# Patient Record
Sex: Male | Born: 1955 | Race: White | Hispanic: No | Marital: Married | Smoking: Former smoker
Health system: Southern US, Community
[De-identification: ages and names within clinical notes are randomized; demographics above are authoritative.]

## PROBLEM LIST (undated history)

## (undated) DIAGNOSIS — F329 Major depressive disorder, single episode, unspecified: Secondary | ICD-10-CM

## (undated) DIAGNOSIS — F419 Anxiety disorder, unspecified: Secondary | ICD-10-CM

## (undated) DIAGNOSIS — M1990 Unspecified osteoarthritis, unspecified site: Secondary | ICD-10-CM

## (undated) DIAGNOSIS — F32A Depression, unspecified: Secondary | ICD-10-CM

## (undated) DIAGNOSIS — G4730 Sleep apnea, unspecified: Secondary | ICD-10-CM

## (undated) DIAGNOSIS — Z87442 Personal history of urinary calculi: Secondary | ICD-10-CM

## (undated) HISTORY — PX: BACK SURGERY: SHX140

## (undated) HISTORY — PX: DENTAL SURGERY: SHX609

## (undated) HISTORY — PX: APPENDECTOMY: SHX54

## (undated) HISTORY — PX: AMPUTATION TOE: SHX6595

---

## 2006-11-21 ENCOUNTER — Ambulatory Visit: Payer: Self-pay | Admitting: Internal Medicine

## 2006-11-21 ENCOUNTER — Inpatient Hospital Stay (HOSPITAL_COMMUNITY): Admission: EM | Admit: 2006-11-21 | Discharge: 2006-11-22 | Payer: Self-pay | Admitting: Emergency Medicine

## 2010-09-16 NOTE — Discharge Summary (Signed)
NAMEAVELARDO, REESMAN NO.:  [id number]   MEDICAL RECORD NO.:  [id number]          PATIENT TYPE:  INP   LOCATION:  3729                         FACILITY:  [facility]   PHYSICIAN:  Lonia Blood, M.D.       DATE OF BIRTH:  07/10/1955   DATE OF ADMISSION:  11/21/2006  DATE OF DISCHARGE:  11/22/2006                               DISCHARGE SUMMARY   PRIMARY CARE PHYSICIAN:  Dr. Jules Husbands at [facility].   DISCHARGE DIAGNOSES:  1. Chest pain -- felt to be noncardiac -- workup in progress through      the [facility].  2. Tobacco abuse.  3. Alcohol abuse.  4. Hyperlipidemia.  5. Status post left knee arthroscopic surgery.  6. History of lumbar disk herniation.  7. Probable esophageal reflux.   DISCHARGE MEDICATIONS:  1. Aspirin 81 mg daily.  2. Nicotine patch 14 mg daily.  3. Pepcid 40 mg daily.  4. Zocor 10 mg daily.  5. Nitroglycerin 0.4 mg sublingually as needed for chest pain.  6. Albuterol MDI one puff 3 times a day as needed.   CONDITION ON DISCHARGE:  Mr. Pitner was discharged in good condition.  At  the time of discharge, the patient was encouraged to follow up at the  [facility] for his schedule outpatient  stress test.   PROCEDURES DURING THIS ADMISSION:  No procedures were done.   CONSULTATION DURING THIS ADMISSION:  The patient was seen in  consultation by Dr. Dietrich Pates from Cardiology.   HISTORY AND PHYSICAL:  For admission history and physical, refer to the  dictated H&P done by Dr. Della Goo, but briefly, Mr. Hilliker was  admitted on the night of November 21, 2006 for recurrent episodes of chest  pain.   HOSPITAL COURSE:  PROBLEM #1 - CHEST PAIN:  Mr. Schuld was observed for  24 hours and a telemetry unit.  He did not develop any arrhythmias.  The  patient had 3 sets of cardiac enzymes which were all within normal  limits.  The patient's EKG did not show any S-T  segment or T-wave  changes.  The patient was seen in consultation by Dr. Dietrich Pates from  Cardiology, who recommended outpatient stress test, which was already  scheduled with the [facility].  The patient was counseled extensively about his tobacco abuse and was  advised to quit smoking.  He was also provided with a prescription for  nicotine patches.  The patient was instructed to continue his aspirin  and Zocor as previously prescribed.      Lonia Blood, M.D.  Electronically Signed     SL/MEDQ  D:  11/22/2006  T:  11/23/2006  Job:  [id number]   cc:   [facility] [employer] .

## 2010-09-16 NOTE — H&P (Signed)
Russell Steele, Russell Steele                ACCOUNT NO.:  [id number]   MEDICAL RECORD NO.:  [id number]          PATIENT TYPE:  INP   LOCATION:  3729                         FACILITY:  [facility]   PHYSICIAN:  Della Goo, M.D. DATE OF BIRTH:  [birth year]   DATE OF ADMISSION:  11/21/2006  DATE OF DISCHARGE:  11/22/2006                              HISTORY & PHYSICAL   This is an unassigned patient.   CHIEF COMPLAINT:  Chest pain.   HISTORY OF PRESENT ILLNESS:  This is a 55 year old male presenting to  the emergency department secondary to complaints of chest pain which he  reports having off and on for several months.  He reports this evening  the pain began and was unremitting.  He described the pain as being  stabbing at first but progressing to a heaviness in the chest and a  funny feeling in the left arm.  The patient does report the pain is in  left side of his chest.  The patient reports prior to arriving at the  hospital he had taken 2 regular strength aspirin for the chest pain.  On  arrival in the emergency department, he was painfree.   PAST MEDICAL HISTORY:  Hyperlipidemia.   MEDICATIONS:  Simvastatin 10 mg 1 p.o. every night.   PAST SURGICAL HISTORY:  Left knee arthroscopy in January 2008,  appendectomy and also a right clubfoot correction surgery.   SOCIAL HISTORY:  The patient smokes 1 pack per day for 20 years.  Positive alcohol usage, occasionally 6 to 8 beers on occasions monthly.   FAMILY HISTORY:  The patient reports being adopted and does not know his  family medical history.   REVIEW OF SYSTEMS:  The patient denies having any nausea, vomiting.  Does report having diaphoresis.  Denies having any fevers, chills.  Denies having shortness of breath.   EXAMINATION FINDINGS:  This is a 55 year old, well-nourished, well-  developed male in no discomfort or acute distress at this time.  VITAL SIGNS:  Temperature 97.7, blood pressure 128/76, heart rate 60,  respirations  18, O2 saturation 96%.  HEENT:  Normocephalic, atraumatic.  Pupils equally round, reactive to  light.  Extraocular muscles are intact.  Funduscopic benign.  Oropharynx  is clear.  NECK:  Is supple.  Full range of motion.  No thyromegaly, adenopathy,  jugular venous distension.  CARDIOVASCULAR:  Regular rate and rhythm.  No murmurs, gallops or rubs.  LUNGS:  Clear to auscultation bilaterally.  ABDOMEN:  Positive bowel sounds, soft, nontender, nondistended.  EXTREMITIES: Without cyanosis, clubbing or edema.  NEUROLOGIC EXAMINATION:  Nonfocal.  VASCULAR:  2+ dorsal pedal pulses bilaterally.   LABORATORY STUDIES:  White blood cell count 7.2, hemoglobin 13.9,  hematocrit 40.8, MCV 96.5, platelets 248, neutrophils 70%, lymphocytes  21%.  Sodium 140, potassium 4.2, chloride 109, carbon dioxide 24, BUN  13, creatinine 1.11, glucose 86, lipase 17.  Myoglobin 50.8, CK-MB less  than 1.0, troponin less than 0.05.  EKG reveals a normal sinus rhythm  without acute ST-segment changes.   ASSESSMENT:  46. A 55 year old male being admitted with chest pain, rule  out MI.  2. Hyperlipidemia.  3. Tobacco abuse.   PLAN:  The patient will be admitted to a telemetry area for cardiac  monitoring.  Cardiac enzymes will be cycled.  He will be placed on nitro  paste along with aspirin therapy, oxygen therapy and a low-dose beta-  blocker therapy.  The patient will be placed on DVT and GI prophylaxis  at this time.  A fasting lipid panel will be checked in the a.m. along  with homocysteine levels and TSH.  Smoking cessation has been discussed,  and smoking cessation counseling will be ordered.      Della Goo, M.D.  Electronically Signed     HJ/MEDQ  D:  11/22/2006  T:  11/23/2006  Job:  [id number]

## 2010-09-16 NOTE — Consult Note (Signed)
NAME:  NAYAN, PROCH NO.:  [id number]   MEDICAL RECORD NO.:  [id number]          PATIENT TYPE:  INP   LOCATION:  3729                         FACILITY:  [facility]   PHYSICIAN:  Pricilla Riffle, MD, FACCDATE OF BIRTH:  1956/02/26   DATE OF CONSULTATION:  11/22/2006  DATE OF DISCHARGE:                                 CONSULTATION   REQUESTING PHYSICIAN:  Dr. Lonia Blood with the [facility] team   PRIMARY CARE Rise Traeger:  Dr. Jules Husbands at the [facility]   PRIMARY CARDIOLOGIST:  Patient is new to [facility] being seen  by Dr. Dietrich Pates   PATIENT PROFILE:  A 55 year old Caucasian male without prior history of  coronary artery disease who presented to the emergency department  yesterday with chest pain.   PROBLEM LIST:  1. Chest pain.  2. Ongoing tobacco abuse, less than one pack per day, 29-pack-year      history.  3. ETOH abuse.  4. Hyperlipidemia, recently started on simvastatin therapy.  5. Status post left knee arthroscopic surgery this year.  6. 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  7. History of L4-L5 disc herniation status post surgery in the late      1980's.   HISTORY OF PRESENT ILLNESS:  This is a 55 year old Caucasian male  without prior history of CAD.  Risk factors include hyperlipidemia and  ongoing tobacco abuse.  He has an approximate six-month history of  intermittent, 9/10, sharp/stabbing/grabbing, left chest pain, occurring  almost exclusively at rest one to two times per month, associated with  mild nausea, lasting just a few seconds, and resolving spontaneously.  He was seen at the [facility] approximately three weeks ago and  was scheduled for a pharmacologic Myoview, as he cannot walk on a  treadmill given his recent left knee surgery.  The scheduling of his  Myoview is pending currently.  They told him that if he had any  recurrent chest pain that he should  present to his local ED.  Yesterday  while at home, he had recurrence of 9/10, sharp, brief chest discomfort  and called 911.  Upon their arrival he was pain free, but then had a  second episode while they were there and they; therefore, brought him  into the [facility] ED.  In the ED, he ruled out for MI and ECG showed  no acute changes.  His D-dimer was negative.  He was admitted for  evaluation.  He has had no recurrent chest pain.   ALLERGIES:  PENICILLIN.   CURRENT MEDICATIONS:  1. Aspirin 325 mg q.d.  2. Nicotine 14 mg q.d.  3. Protonix 40 IV q.d.  4. Senna one tab q.h.s.  5. Zocor 10 mg q.p.m.   HOME MEDICATIONS:  Aspirin and Zocor.   FAMILY HISTORY:  He is adopted and does not know about his biologic  parents' health history.   SOCIAL HISTORY:  He lives in [place] with his fiance.  He is an  Radio broadcast assistant.  He  is previously divorced.  He has a 29-pack-  year history of tobacco abuse, currently smoking less than one pack per  day.  He drinks between two and eight beers per day.  He denies any drug  use.  He does not routinely exercise, but is active at both work and in  his yard exerting himself fairly heavily without symptoms or  limitations.   REVIEW OF SYSTEMS:  Positive for fevers, chills and nausea and diarrhea  over a 48-hour period between July 2 and July 3.  He has had a headache  during this admission which is likely associated to nitroglycerin.  He  has chest pain as described in the HPI.  He has dyspnea on exertion if  he walks up and down stairs frequently.  He does have some anxiety with  regards to recently being out of work.  He has arthralgia of his hands  if he uses them a lot.  He has mildly associated nausea with his chest  pain as outlined above.  Otherwise, all systems reviewed negative.   PHYSICAL EXAMINATION:  VITAL SIGNS:  Temperature 97.1, heart rate 50,  respirations 18, blood pressure 111/58, pulse ox 95% on room air.  GENERAL:   Pleasant white male in no acute distress, awake, alert and  oriented x3.  HEENT:  Normal.  NEURO:  Grossly intact and nonfocal.  NECK:  No bruits or JVD.  LUNGS:  Respirations are regular and unlabored.  Clear to auscultation.  CARDIAC:  Regular S1-S2.  No S3-S4 or murmurs.  ABDOMEN:  Round, soft, nontender, nondistended.  Bowel sounds present  x4.  EXTREMITIES:  Warm, dry, pink.  No clubbing, cyanosis or edema.  Dorsalis pedis, posterior tibial pulses 2+ and equal bilaterally.   Chest x-ray is pending.   EKG shows sinus rhythm with a normal axis, rate of 67 beats per minute  and some early repolarization in V1/V2 and V3.   Hemoglobin 14.6, hematocrit 43.0, WBC 7.2, platelets 248, sodium 140,  potassium 4.2, chloride 108, CO2 24, BUN 13, creatinine 1.2, glucose 84.  Total bilirubin 0.6, alkaline phosphatase 62, AST 24, ALT 26, total  protein 6.4, albumin 3.8.  Cardiac markers are negative x3 with his most  recent CK being 92, MB of 1.6, troponin I 0.02.  D-dimer is 0.22.  Calcium is 8.4.  Total cholesterol 125, triglycerides 55, HDL 73, LDL  91.   ASSESSMENT/PLAN:  1. Chest pain, fairly atypical for angina, as he describes it as being      sharp, shooting and fleeting.  He is fairly active at work and at      home without symptoms or limitations.  ECG and cardiac markers are      within normal limits and he has had no additional chest pain.  He      already has a Myoview pending through the [facility] and      otherwise has no insurance.  I feel that he is okay for discharge      today with followup at the [facility]      for his Myoview.  Continue aspirin, statin, and will provide a      prescription for sublingual nitroglycerin.  2. Hyperlipidemia.  LDL of 91.  Continue statin.  3. Tobacco and alcohol abuse.  Cessation advised.      Nicolasa Ducking, ANP      Pricilla Riffle, MD, [facility]  Electronically Signed    CB/MEDQ  D:  11/22/2006  T:  11/22/2006  Job:  [id number]

## 2011-02-16 LAB — I-STAT 8, (EC8 V) (CONVERTED LAB)
BUN: 13
Bicarbonate: 23.6
Chloride: 108
Operator id: 277751
Potassium: 4.2
Sodium: 140
TCO2: 25
pH, Ven: 7.419 — ABNORMAL HIGH

## 2011-02-16 LAB — COMPREHENSIVE METABOLIC PANEL
ALT: 26
AST: 24
Alkaline Phosphatase: 62
Calcium: 8.4
GFR calc Af Amer: >60
Glucose, Bld: 86
Potassium: 4.2
Sodium: 140
Total Protein: 6.4

## 2011-02-16 LAB — POCT CARDIAC MARKERS
Myoglobin, poc: 53.8
Operator id: 277751
Operator id: 277751
Troponin i, poc: <0.05
Troponin i, poc: <0.05

## 2011-02-16 LAB — POCT I-STAT CREATININE: Creatinine, Ser: 1.2

## 2011-02-16 LAB — CK TOTAL AND CKMB (NOT AT ARMC)
Relative Index: 1.8
Total CK: 105

## 2011-02-16 LAB — DIFFERENTIAL
Basophils Relative: 1
Eosinophils Absolute: 0.1
Eosinophils Relative: 2
Lymphs Abs: 1.5
Monocytes Absolute: 0.4
Monocytes Relative: 6

## 2011-02-16 LAB — CARDIAC PANEL(CRET KIN+CKTOT+MB+TROPI)
Relative Index: INVALID
Relative Index: INVALID
Total CK: 92
Troponin I: 0.02

## 2011-02-16 LAB — D-DIMER, QUANTITATIVE: D-Dimer, Quant: 0.22

## 2011-02-16 LAB — CBC
Hemoglobin: 13.9
MCHC: 34
RBC: 4.23
RDW: 13.4

## 2011-02-16 LAB — LIPID PANEL
LDL Cholesterol: 91
VLDL: 11

## 2011-02-16 LAB — TROPONIN I: Troponin I: 0.02

## 2015-05-05 HISTORY — PX: KNEE ARTHROSCOPY: SUR90

## 2016-02-21 ENCOUNTER — Encounter (HOSPITAL_COMMUNITY): Payer: Self-pay | Admitting: Emergency Medicine

## 2016-02-21 ENCOUNTER — Emergency Department (HOSPITAL_COMMUNITY)
Admission: EM | Admit: 2016-02-21 | Discharge: 2016-02-21 | Disposition: A | Discharge status: Home / Self Care | Payer: Medicare Other | Attending: Emergency Medicine | Admitting: Emergency Medicine

## 2016-02-21 DIAGNOSIS — Y92007 Garden or yard of unspecified non-institutional (private) residence as the place of occurrence of the external cause: Secondary | ICD-10-CM | POA: Insufficient documentation

## 2016-02-21 DIAGNOSIS — W57XXXA Bitten or stung by nonvenomous insect and other nonvenomous arthropods, initial encounter: Secondary | ICD-10-CM | POA: Insufficient documentation

## 2016-02-21 DIAGNOSIS — Y9389 Activity, other specified: Secondary | ICD-10-CM | POA: Insufficient documentation

## 2016-02-21 DIAGNOSIS — F17210 Nicotine dependence, cigarettes, uncomplicated: Secondary | ICD-10-CM | POA: Insufficient documentation

## 2016-02-21 DIAGNOSIS — Y999 Unspecified external cause status: Secondary | ICD-10-CM | POA: Insufficient documentation

## 2016-02-21 DIAGNOSIS — S40862A Insect bite (nonvenomous) of left upper arm, initial encounter: Secondary | ICD-10-CM | POA: Insufficient documentation

## 2016-02-21 MED ORDER — PREDNISONE 20 MG PO TABS: 40.0000 mg | ORAL_TABLET | Freq: Every day | ORAL | 0 refills | Status: DC

## 2016-02-21 MED ORDER — NAPROXEN 500 MG PO TABS: 500.0000 mg | ORAL_TABLET | Freq: Two times a day (BID) | ORAL | 0 refills | Status: DC

## 2016-02-21 MED ORDER — HYDROCODONE-ACETAMINOPHEN 5-325 MG PO TABS: 1.0000 | ORAL_TABLET | Freq: Once | ORAL | Status: DC

## 2016-02-21 MED ORDER — CYCLOBENZAPRINE HCL 10 MG PO TABS
10.0000 mg | ORAL_TABLET | Freq: Once | ORAL | Status: AC
  Administered 2016-02-21: 10 mg via ORAL
  Filled 2016-02-21: qty 1

## 2016-02-21 MED ORDER — IBUPROFEN 800 MG PO TABS
800.0000 mg | ORAL_TABLET | Freq: Once | ORAL | Status: AC
  Administered 2016-02-21: 800 mg via ORAL
  Filled 2016-02-21: qty 1

## 2016-02-21 NOTE — Discharge Instructions (Signed)
Ice packs on/off to arm.  Continue taking benadryl one capsule every 4-6 hrs for itching.  Return here for any worsening symptoms such as increasing pain, redness or swelling

## 2016-02-21 NOTE — ED Provider Notes (Signed)
[facility]-EMERGENCY DEPT Provider Note   CSN: [id number] Arrival date & time: 02/21/16  1627     History   Chief Complaint Chief Complaint  Patient presents with  . Insect Bite    HPI Russell Steele is a 60 y.o. male.  HPI  Russell Steele is a 60 y.o. male who presents to the Emergency Department complaining of Insect bite to left upper arm. He states that he was outside working in the yard when he felt something bite or sting his upper arm and saw some type of unknown insect. Incident occurred approximately 4 hours prior to arrival. He applied ice and took Benadryl. He reports intermittent sharp stinging pains to the affected area with mild swelling and redness. He denies numbness of his arm, chest pain, shortness of breath, and facial or airway edema.    History reviewed. No pertinent past medical history.  There are no active problems to display for this patient.   Past Surgical History:  Procedure Laterality Date  . AMPUTATION TOE    . APPENDECTOMY    . BACK SURGERY    . DENTAL SURGERY    . KNEE ARTHROSCOPY         Home Medications    Prior to Admission medications   Not on File    Family History History reviewed. No pertinent family history.  Social History Social History  Substance Use Topics  . Smoking status: Current Every Day Smoker    Packs/day: 0.50    Types: Cigarettes  . Smokeless tobacco: Never Used  . Alcohol use 1.8 oz/week    3 Cans of beer per week     Allergies   Penicillins   Review of Systems Review of Systems  Constitutional: Negative for activity change, appetite change, chills and fever.  HENT: Negative for facial swelling, sore throat and trouble swallowing.   Respiratory: Negative for chest tightness, shortness of breath and wheezing.   Musculoskeletal: Negative for neck pain and neck stiffness.  Skin: Positive for rash. Negative for wound.  Neurological: Negative for dizziness, weakness, numbness and headaches.  All other  systems reviewed and are negative.    Physical Exam Updated Vital Signs BP 154/67 (BP Location: Right Arm)   Pulse 82   Temp 98.9 F (37.2 C)   Resp 16   Ht 6\' 3"  (1.905 m)   Wt 115.7 kg   SpO2 97%   BMI 31.87 kg/m   Physical Exam  Constitutional: He is oriented to person, place, and time. He appears well-developed and well-nourished. No distress.  HENT:  Head: Normocephalic and atraumatic.  Mouth/Throat: Uvula is midline, oropharynx is clear and moist and mucous membranes are normal. No trismus in the jaw. No posterior oropharyngeal edema.  Eyes: Conjunctivae and EOM are normal. Pupils are equal, round, and reactive to light.  Neck: Normal range of motion. Neck supple.  Cardiovascular: Normal rate, regular rhythm and intact distal pulses.   No murmur heard. Pulmonary/Chest: Effort normal and breath sounds normal. No respiratory distress.  Musculoskeletal: He exhibits no edema or tenderness.  Lymphadenopathy:    He has no cervical adenopathy.  Neurological: He is alert and oriented to person, place, and time. He exhibits normal muscle tone. Coordination normal.  Skin: Skin is warm. Rash noted. There is erythema.  Single welt to the posterior left upper arm. Mild surrounding erythema.  No edema.  Distal sensation intact  Psychiatric: He has a normal mood and affect.  Nursing note and vitals reviewed.    ED  Treatments / Results  Labs (all labs ordered are listed, but only abnormal results are displayed) Labs Reviewed - No data to display  EKG  EKG Interpretation None       Radiology No results found.  Procedures Procedures (including critical care time)  Medications Ordered in ED Medications  ibuprofen (ADVIL,MOTRIN) tablet 800 mg (not administered)  cyclobenzaprine (FLEXERIL) tablet 10 mg (not administered)     Initial Impression / Assessment and Plan / ED Course  I have reviewed the triage vital signs and the nursing notes.  Pertinent labs & imaging  results that were available during my care of the patient were reviewed by me and considered in my medical decision making (see chart for details).  Clinical Course   Pt well appearing.  Airway patent.  No edema.  Localized rxn to insect bite.  Pt stable for d/c.  Agrees to ice, benadryl  The patient appears reasonably screened and/or stabilized for discharge and I doubt any other medical condition or other [facility] requiring further screening, evaluation, or treatment in the ED at this time prior to discharge.  Final Clinical Impressions(s) / ED Diagnoses   Final diagnoses:  Insect bite, initial encounter    New Prescriptions Discharge Medication List as of 02/21/2016  5:06 PM    START taking these medications   Details  naproxen (NAPROSYN) 500 MG tablet Take 1 tablet (500 mg total) by mouth 2 (two) times daily with a meal., Starting Fri 02/21/2016, Print    predniSONE (DELTASONE) 20 MG tablet Take 2 tablets (40 mg total) by mouth daily. For 5 days, Starting Fri 02/21/2016, Print         Pauline Aus, [place] 02/22/16 2330    Marily Memos, MD 02/23/16 [phone]

## 2016-02-21 NOTE — ED Triage Notes (Signed)
PT states he was outside working in the yard and picked up a bucket and felt something bite his upper left arm and he hit it and seen a black insect fall to the ground. PT states sharp pains since bite.

## 2016-07-15 ENCOUNTER — Ambulatory Visit: Payer: Self-pay | Admitting: Orthopedic Surgery

## 2016-07-17 ENCOUNTER — Ambulatory Visit: Payer: Self-pay | Admitting: Orthopedic Surgery

## 2016-07-17 NOTE — H&P (Signed)
TOTAL HIP ADMISSION H&P  Patient is admitted for right total hip arthroplasty.  Subjective:  Chief Complaint: right hip pain  HPI: Russell Steele, 61 y.o. male, has a history of pain and functional disability in the right hip(s) due to AVN and patient has failed non-surgical conservative treatments for greater than 12 weeks to include NSAID's and/or analgesics, flexibility and strengthening excercises, use of assistive devices, weight reduction as appropriate and activity modification.  Onset of symptoms was gradual starting 3 years ago with rapidlly worsening course since that time.The patient noted no past surgery on the right hip(s).  Patient currently rates pain in the right hip at 10 out of 10 with activity. Patient has night pain, worsening of pain with activity and weight bearing, trendelenberg gait, pain that interfers with activities of daily living, pain with passive range of motion, crepitus and joint swelling. Patient has evidence of subchondral cysts, subchondral sclerosis, joint space narrowing and AVN with collapse by imaging studies. This condition presents safety issues increasing the risk of falls. There is no current active infection.  There are no active problems to display for this patient.  No past medical history on file.  Past Surgical History:  Procedure Laterality Date  . AMPUTATION TOE    . APPENDECTOMY    . BACK SURGERY    . DENTAL SURGERY    . KNEE ARTHROSCOPY       (Not in a hospital admission) Allergies  Allergen Reactions  . Penicillins Anaphylaxis    Social History  Substance Use Topics  . Smoking status: Current Every Day Smoker    Packs/day: 0.50    Types: Cigarettes  . Smokeless tobacco: Never Used  . Alcohol use 1.8 oz/week    3 Cans of beer per week    No family history on file.   Review of Systems  Constitutional: Negative.   HENT: Positive for tinnitus.   Eyes: Negative.   Respiratory: Positive for shortness of breath.   Cardiovascular:  Negative.   Gastrointestinal: Negative.   Musculoskeletal: Positive for back pain and joint pain.  Skin: Negative.   Neurological: Negative.   Endo/Heme/Allergies: Negative.   Psychiatric/Behavioral: Negative.     Objective:  Physical Exam  Vitals reviewed. Constitutional: He is oriented to person, place, and time. He appears well-developed and well-nourished.  HENT:  Head: Normocephalic and atraumatic.  Eyes: Conjunctivae and EOM are normal. Pupils are equal, round, and reactive to light.  Neck: Normal range of motion. Neck supple.  Cardiovascular: Normal rate, regular rhythm and intact distal pulses.   Respiratory: Effort normal. No respiratory distress.  GI: He exhibits no distension.  Genitourinary:  Genitourinary Comments: deferred  Musculoskeletal:       Right hip: He exhibits decreased range of motion, bony tenderness and crepitus.  Neurological: He is alert and oriented to person, place, and time. He has normal reflexes.  Skin: Skin is warm and dry.  Psychiatric: He has a normal mood and affect. His behavior is normal. Judgment and thought content normal.    Vital signs in last 24 hours: @  Labs:   Estimated body mass index is 31.87 kg/m as calculated from the following:   Height as of 02/21/16:  (1.905 m).   Weight as of 02/21/16: 115.7 kg (255 lb).   Imaging Review Plain radiographs demonstrate severe degenerative joint disease of the right hip(s). The bone quality appears to be adequate for age and reported activity level.  Assessment/Plan:  AVN, right hip(s)  The patient  history, physical examination, clinical judgement of the provider and imaging studies are consistent with end stage degenerative joint disease of the right hip(s) and total hip arthroplasty is deemed medically necessary. The treatment options including medical management, injection therapy, arthroscopy and arthroplasty were discussed at length. The risks and benefits of total  hip arthroplasty were presented and reviewed. The risks due to aseptic loosening, infection, stiffness, dislocation/subluxation,  thromboembolic complications and other imponderables were discussed.  The patient acknowledged the explanation, agreed to proceed with the plan and consent was signed. Patient is being admitted for inpatient treatment for surgery, pain control, PT, OT, prophylactic antibiotics, VTE prophylaxis, progressive ambulation and ADL's and discharge planning.The patient is planning to be discharged home with home health services

## 2016-07-30 ENCOUNTER — Encounter (HOSPITAL_COMMUNITY)
Admission: RE | Admit: 2016-07-30 | Discharge: 2016-07-30 | Disposition: A | Discharge status: Home / Self Care | Payer: Non-veteran care | Source: Ambulatory Visit | Attending: Orthopedic Surgery | Admitting: Orthopedic Surgery

## 2016-07-30 ENCOUNTER — Encounter (HOSPITAL_COMMUNITY): Payer: Self-pay

## 2016-07-30 DIAGNOSIS — R001 Bradycardia, unspecified: Secondary | ICD-10-CM | POA: Diagnosis not present

## 2016-07-30 DIAGNOSIS — I96 Gangrene, not elsewhere classified: Secondary | ICD-10-CM | POA: Diagnosis not present

## 2016-07-30 DIAGNOSIS — Z01810 Encounter for preprocedural cardiovascular examination: Secondary | ICD-10-CM | POA: Diagnosis present

## 2016-07-30 DIAGNOSIS — Z01812 Encounter for preprocedural laboratory examination: Secondary | ICD-10-CM | POA: Diagnosis present

## 2016-07-30 HISTORY — DX: Major depressive disorder, single episode, unspecified: F32.9

## 2016-07-30 HISTORY — DX: Unspecified osteoarthritis, unspecified site: M19.90

## 2016-07-30 HISTORY — DX: Anxiety disorder, unspecified: F41.9

## 2016-07-30 HISTORY — DX: Sleep apnea, unspecified: G47.30

## 2016-07-30 HISTORY — DX: Personal history of urinary calculi: Z87.442

## 2016-07-30 HISTORY — DX: Depression, unspecified: F32.A

## 2016-07-30 LAB — TYPE AND SCREEN
ABO/RH(D): O POS
Antibody Screen: NEGATIVE

## 2016-07-30 LAB — CBC
HCT: 40.8 % (ref 39.0–52.0)
HEMOGLOBIN: 13.9 g/dL (ref 13.0–17.0)
MCH: 32.4 pg (ref 26.0–34.0)
MCHC: 34.1 g/dL (ref 30.0–36.0)
MCV: 95.1 fL (ref 78.0–100.0)
Platelets: 227 10*3/uL (ref 150–400)
RBC: 4.29 MIL/uL (ref 4.22–5.81)
RDW: 12.6 % (ref 11.5–15.5)
WBC: 5.7 10*3/uL (ref 4.0–10.5)

## 2016-07-30 LAB — BASIC METABOLIC PANEL
ANION GAP: 11 (ref 5–15)
BUN: 13 mg/dL (ref 6–20)
CALCIUM: 9.4 mg/dL (ref 8.9–10.3)
CO2: 26 mmol/L (ref 22–32)
Chloride: 102 mmol/L (ref 101–111)
Creatinine, Ser: 1.04 mg/dL (ref 0.61–1.24)
GFR calc Af Amer: >60 mL/min (ref >60)
GFR calc non Af Amer: >60 mL/min (ref >60)
Glucose, Bld: 100 mg/dL — ABNORMAL HIGH (ref 65–99)
Potassium: 4.3 mmol/L (ref 3.5–5.1)
SODIUM: 139 mmol/L (ref 135–145)

## 2016-07-30 LAB — ABO/RH: ABO/RH(D): O POS

## 2016-07-30 LAB — SURGICAL PCR SCREEN
MRSA, PCR: NEGATIVE
STAPHYLOCOCCUS AUREUS: NEGATIVE

## 2016-07-30 MED ORDER — VANCOMYCIN HCL IN DEXTROSE 1-5 GM/200ML-% IV SOLN: 1000.0000 mg | INTRAVENOUS | Status: DC

## 2016-07-30 MED ORDER — CHLORHEXIDINE GLUCONATE 4 % EX LIQD: 60.0000 mL | Freq: Once | CUTANEOUS | Status: DC

## 2016-07-30 NOTE — Progress Notes (Addendum)
PCP: Dr. Arlana Pouchhristopher Dydr  Cardiologist: pt denies  EKG: pt denies past year  Stress test: pt denies  ECHO: pt denies  Cardiac Cath: pt denies  Chest x-ray: pt denies past year

## 2016-07-30 NOTE — Pre-Procedure Instructions (Signed)
    Russell CampVictor Steele  07/30/2016     No Pharmacies Listed  Your procedure is scheduled on Mon. Apr. 9 at 1030 AM. [facility] at [facility]30 AM.  Call this number if you have problems the morning of surgery:  [phone]   Remember:  Do not eat food or drink liquids after midnight.  Take these medicines the morning of surgery with A SIP OF WATER acetaminophen (tylenol)-if needed, albuterol inhaler, buspirone (buspar), diphendhydramine (benedryl), Fluoxetine (prozac), gabapentin (neurontin), oxycodone (roxicodone)-if needed, Afrin nasal spray.   Do not wear jewelry, make-up or nail polish.  Do not wear lotions, powders, or colognes, or deoderant.  Men may shave face and neck.  Do not bring valuables to the hospital.  [facility] is not [facility] any belongings or valuables.  Contacts, dentures or bridgework may not be worn into surgery.  Leave your suitcase in the car.  After surgery it may be brought to your room.  For patients admitted to the hospital, discharge time will be determined by your treatment team.  Patients discharged the day of surgery will not be allowed to drive home.    Special instructions:  asee attached  Please read over the following fact sheets that you were given. Pain Booklet, Coughing and Deep Breathing, MRSA Information and Surgical Site Infection Prevention

## 2016-08-07 MED ORDER — ACETAMINOPHEN 10 MG/ML IV SOLN
1000.0000 mg | INTRAVENOUS | Status: AC
  Administered 2016-08-10: 1000 mg via INTRAVENOUS
  Filled 2016-08-07: qty 100

## 2016-08-07 MED ORDER — TRANEXAMIC ACID 1000 MG/10ML IV SOLN
1000.0000 mg | INTRAVENOUS | Status: AC
  Administered 2016-08-10: 1000 mg via INTRAVENOUS
  Filled 2016-08-07: qty 10

## 2016-08-07 MED ORDER — SODIUM CHLORIDE 0.9 % IV SOLN: INTRAVENOUS | Status: DC

## 2016-08-10 ENCOUNTER — Inpatient Hospital Stay (HOSPITAL_COMMUNITY)
Admission: RE | Admit: 2016-08-10 | Discharge: 2016-08-11 | DRG: 470 | Disposition: A | Discharge status: Home / Self Care | Payer: Non-veteran care | Source: Ambulatory Visit | Attending: Orthopedic Surgery | Admitting: Orthopedic Surgery

## 2016-08-10 ENCOUNTER — Inpatient Hospital Stay (HOSPITAL_COMMUNITY): Payer: Non-veteran care

## 2016-08-10 ENCOUNTER — Inpatient Hospital Stay (HOSPITAL_COMMUNITY): Payer: Non-veteran care | Admitting: Anesthesiology

## 2016-08-10 ENCOUNTER — Encounter (HOSPITAL_COMMUNITY)
Admission: RE | Disposition: A | Discharge status: Home / Self Care | Payer: Self-pay | Source: Ambulatory Visit | Attending: Orthopedic Surgery

## 2016-08-10 ENCOUNTER — Encounter (HOSPITAL_COMMUNITY): Payer: Self-pay | Admitting: Certified Registered Nurse Anesthetist

## 2016-08-10 DIAGNOSIS — Z87442 Personal history of urinary calculi: Secondary | ICD-10-CM | POA: Diagnosis not present

## 2016-08-10 DIAGNOSIS — M87051 Idiopathic aseptic necrosis of right femur: Secondary | ICD-10-CM

## 2016-08-10 DIAGNOSIS — Z9889 Other specified postprocedural states: Secondary | ICD-10-CM | POA: Diagnosis not present

## 2016-08-10 DIAGNOSIS — Z419 Encounter for procedure for purposes other than remedying health state, unspecified: Secondary | ICD-10-CM

## 2016-08-10 DIAGNOSIS — M1611 Unilateral primary osteoarthritis, right hip: Secondary | ICD-10-CM | POA: Diagnosis present

## 2016-08-10 DIAGNOSIS — D62 Acute posthemorrhagic anemia: Secondary | ICD-10-CM | POA: Diagnosis not present

## 2016-08-10 DIAGNOSIS — F17210 Nicotine dependence, cigarettes, uncomplicated: Secondary | ICD-10-CM | POA: Diagnosis present

## 2016-08-10 DIAGNOSIS — G4730 Sleep apnea, unspecified: Secondary | ICD-10-CM | POA: Diagnosis present

## 2016-08-10 DIAGNOSIS — Z89429 Acquired absence of other toe(s), unspecified side: Secondary | ICD-10-CM | POA: Diagnosis not present

## 2016-08-10 DIAGNOSIS — Z9049 Acquired absence of other specified parts of digestive tract: Secondary | ICD-10-CM

## 2016-08-10 DIAGNOSIS — M879 Osteonecrosis, unspecified: Principal | ICD-10-CM | POA: Diagnosis present

## 2016-08-10 DIAGNOSIS — Z09 Encounter for follow-up examination after completed treatment for conditions other than malignant neoplasm: Secondary | ICD-10-CM

## 2016-08-10 HISTORY — PX: TOTAL HIP ARTHROPLASTY: SHX124

## 2016-08-10 IMAGING — RF DG HIP (WITH PELVIS) OPERATIVE*R*
1 series · 2 of 2 positions shown · non-contrast
Comparison: None.

CLINICAL DATA: Intraoperative imaging for right hip replacement.

EXAM:
OPERATIVE RIGHT HIP (WITH PELVIS IF PERFORMED) 2 VIEWS
TECHNIQUE: Fluoroscopic spot image(s) were submitted for interpretation
post-operatively.

[Series 1: run · 2 of 2 slices shown]
[im 1/2]
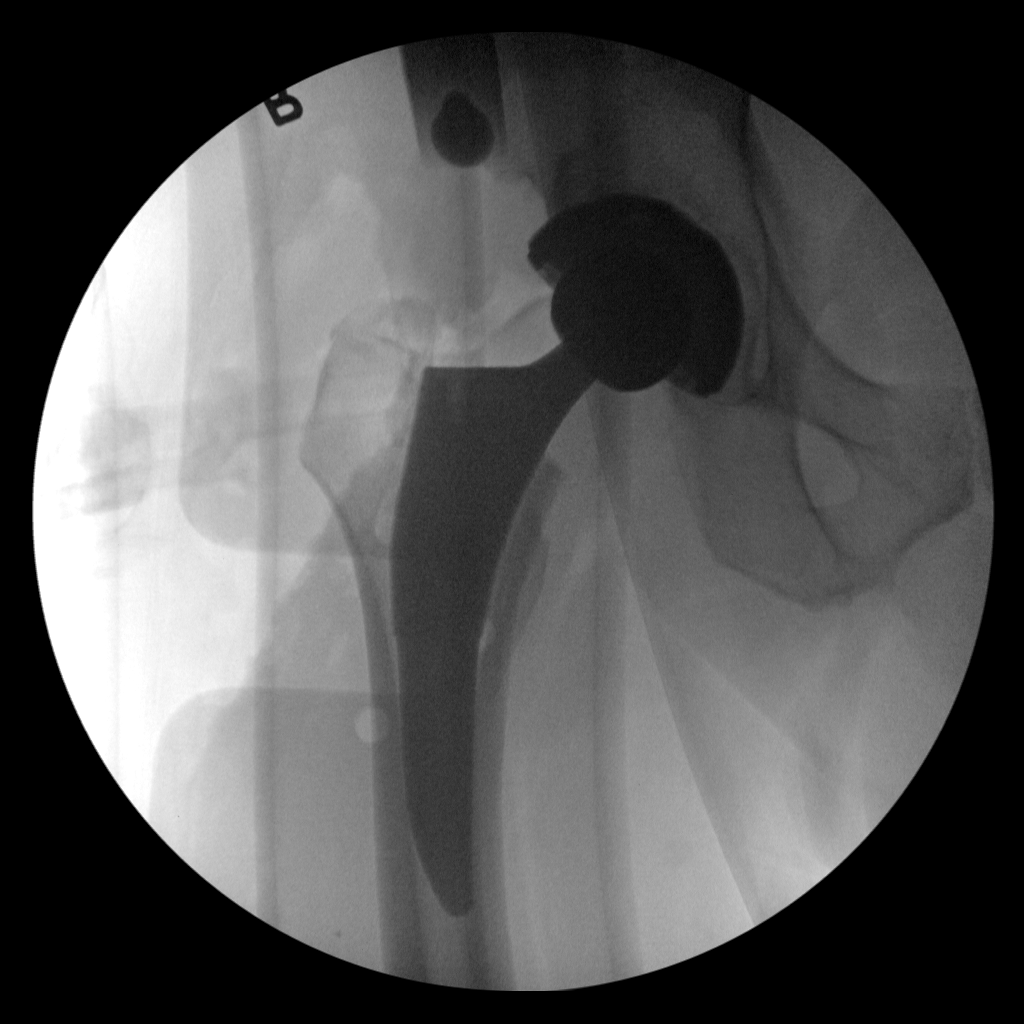
[im 2/2]
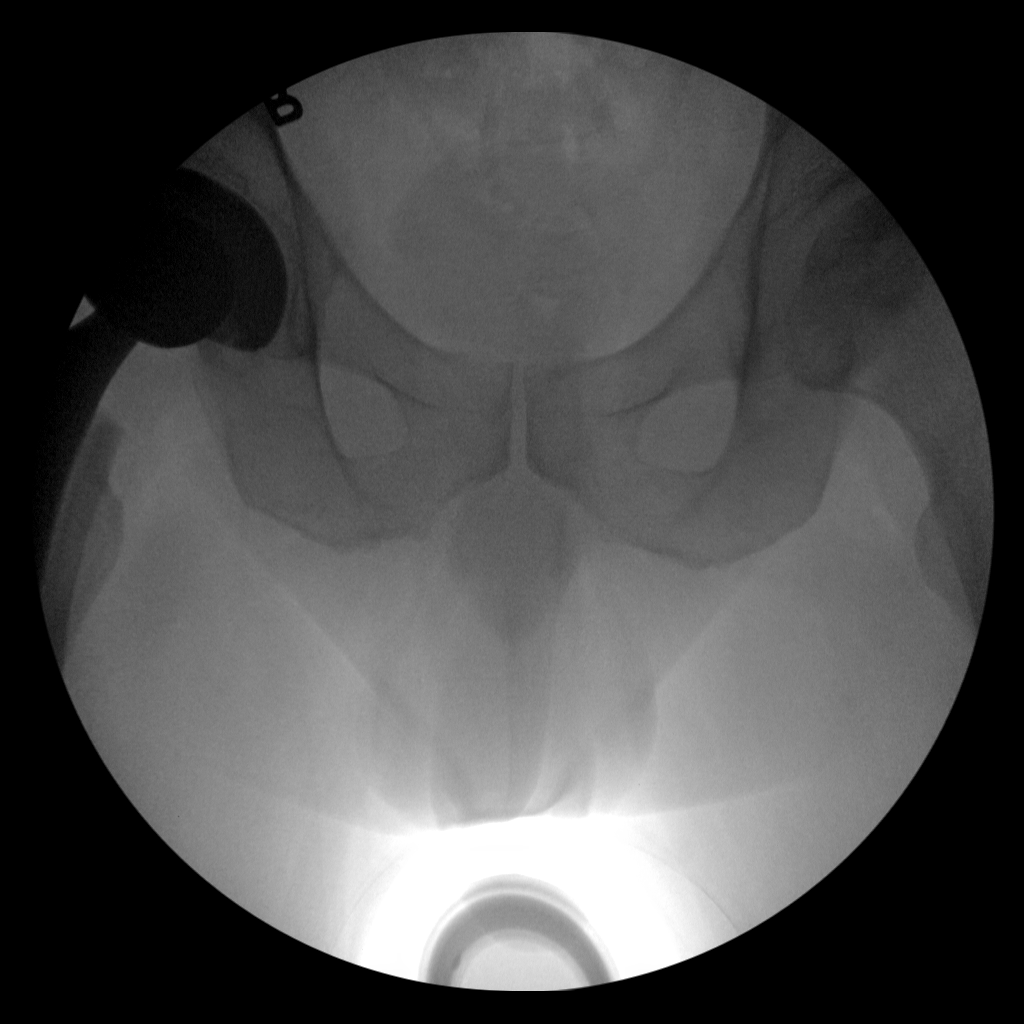

[2 of 2 positions shown; findings below may reference images not displayed]

FINDINGS: Two fluoroscopic spot views of the low pelvis and right hip
demonstrate a right total hip arthroplasty in place. The device is
located. No fracture.
IMPRESSION: Right total hip replacement.  No acute abnormality.

## 2016-08-10 IMAGING — CR DG PORTABLE PELVIS
1 series · 1 of 1 positions shown · non-contrast
Comparison: None.

CLINICAL DATA: Post right total hip arthroplasty

EXAM:
PORTABLE PELVIS 1-2 VIEWS

[AP]
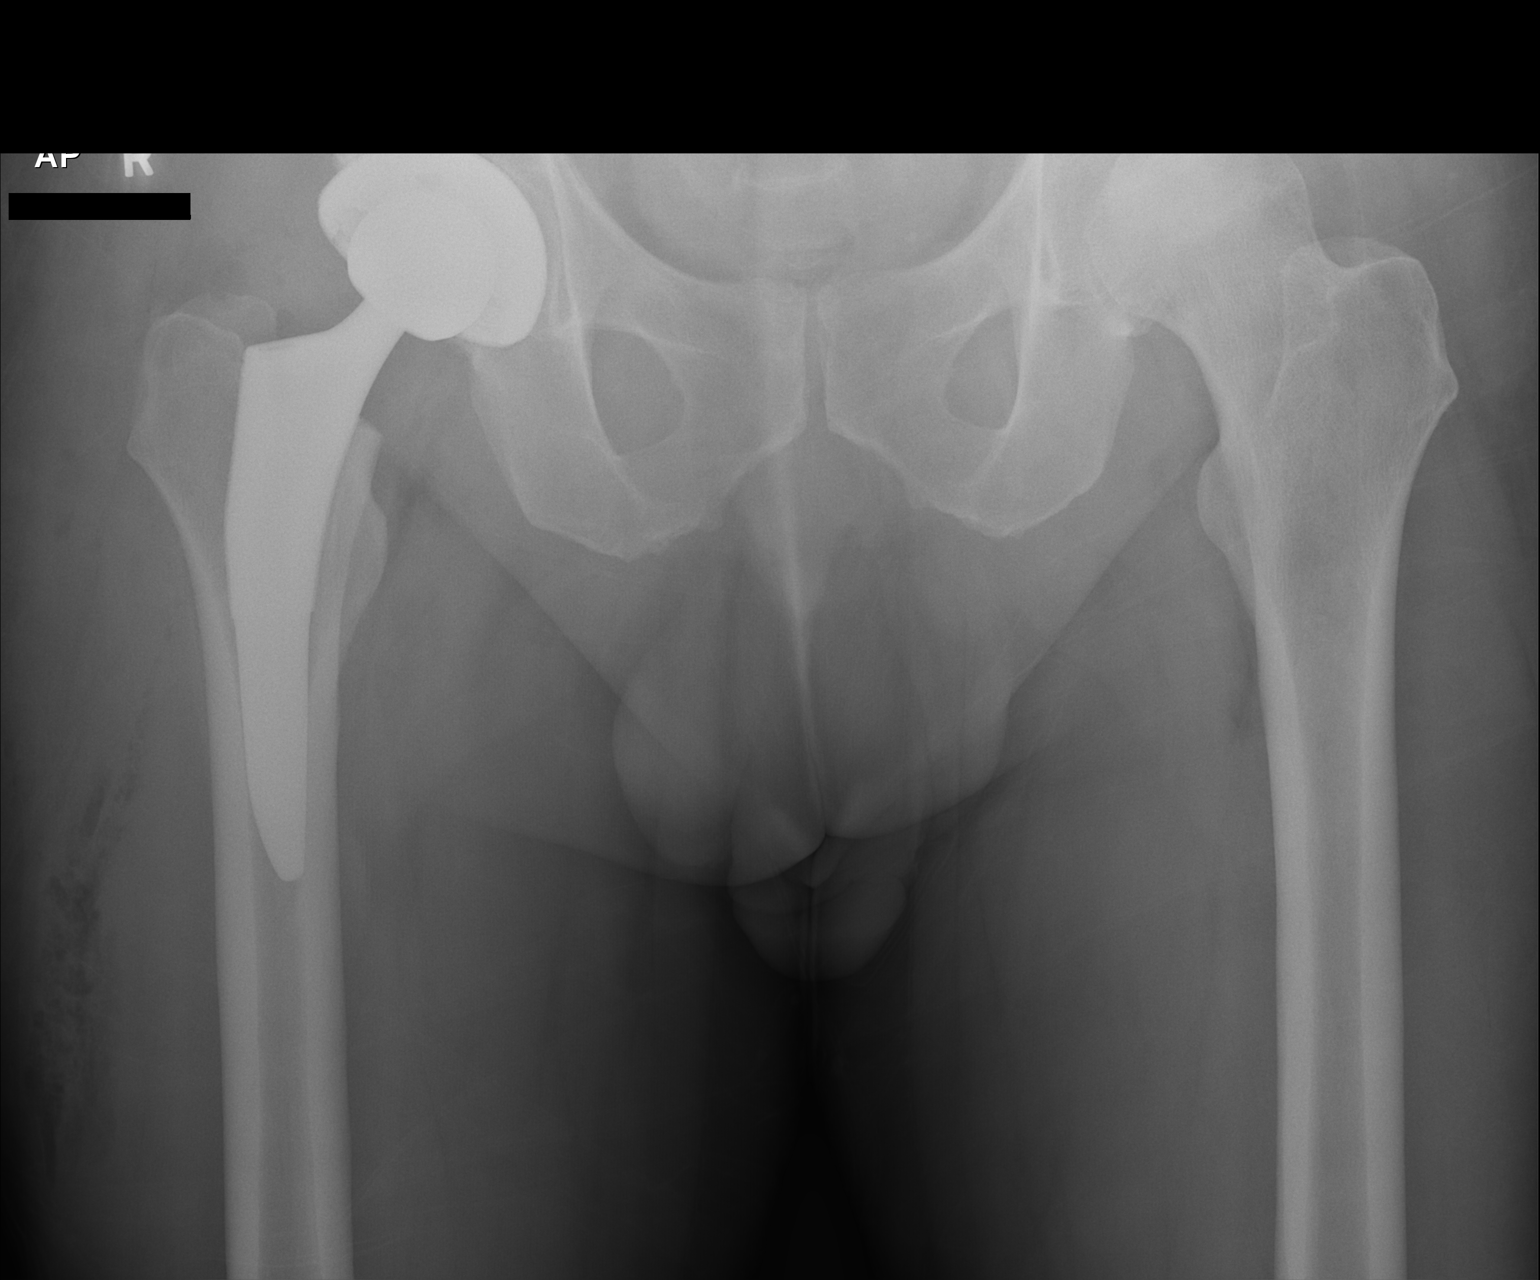

[1 of 1 positions shown; findings below may reference images not displayed]

FINDINGS: Changes of right hip replacement. Normal AP alignment. No hardware
or bony complicating feature.
IMPRESSION: Right hip replacement.  No visible complicating feature.

## 2016-08-10 SURGERY — ARTHROPLASTY, HIP, TOTAL, ANTERIOR APPROACH
Anesthesia: General | Site: Hip | Laterality: Right

## 2016-08-10 MED ORDER — ROCURONIUM BROMIDE 50 MG/5ML IV SOSY
PREFILLED_SYRINGE | INTRAVENOUS | Status: AC
  Filled 2016-08-10: qty 15

## 2016-08-10 MED ORDER — PROPOFOL 10 MG/ML IV BOLUS
INTRAVENOUS | Status: DC | PRN
  Administered 2016-08-10: 200 mg via INTRAVENOUS

## 2016-08-10 MED ORDER — DOCUSATE SODIUM 100 MG PO CAPS
100.0000 mg | ORAL_CAPSULE | Freq: Two times a day (BID) | ORAL | Status: DC
  Administered 2016-08-10 – 2016-08-11 (×2): 100 mg via ORAL
  Filled 2016-08-10 (×2): qty 1

## 2016-08-10 MED ORDER — METHOCARBAMOL 1000 MG/10ML IJ SOLN
500.0000 mg | Freq: Four times a day (QID) | INTRAVENOUS | Status: DC | PRN
  Filled 2016-08-10: qty 5

## 2016-08-10 MED ORDER — SODIUM CHLORIDE 0.9 % IV SOLN
INTRAVENOUS | Status: DC
  Administered 2016-08-10: 150 mL/h via INTRAVENOUS
  Administered 2016-08-10: 14:00:00 via INTRAVENOUS
  Administered 2016-08-11: 150 mL/h via INTRAVENOUS

## 2016-08-10 MED ORDER — SODIUM CHLORIDE 0.9 % IR SOLN
Status: DC | PRN
  Administered 2016-08-10: 3000 mL

## 2016-08-10 MED ORDER — TAMSULOSIN HCL 0.4 MG PO CAPS
0.4000 mg | ORAL_CAPSULE | Freq: Every day | ORAL | Status: DC
  Administered 2016-08-10: 0.4 mg via ORAL
  Filled 2016-08-10: qty 1

## 2016-08-10 MED ORDER — EPHEDRINE SULFATE 50 MG/ML IJ SOLN
INTRAMUSCULAR | Status: DC | PRN
  Administered 2016-08-10: 15 mg via INTRAVENOUS
  Administered 2016-08-10: 20 mg via INTRAVENOUS
  Administered 2016-08-10: 15 mg via INTRAVENOUS

## 2016-08-10 MED ORDER — PROMETHAZINE HCL 25 MG/ML IJ SOLN: 6.2500 mg | INTRAMUSCULAR | Status: DC | PRN

## 2016-08-10 MED ORDER — SUGAMMADEX SODIUM 200 MG/2ML IV SOLN
INTRAVENOUS | Status: AC
  Filled 2016-08-10: qty 2

## 2016-08-10 MED ORDER — TRANEXAMIC ACID 1000 MG/10ML IV SOLN
1000.0000 mg | Freq: Once | INTRAVENOUS | Status: AC
  Administered 2016-08-10: 1000 mg via INTRAVENOUS
  Filled 2016-08-10: qty 10

## 2016-08-10 MED ORDER — ONDANSETRON HCL 4 MG/2ML IJ SOLN
INTRAMUSCULAR | Status: AC
  Filled 2016-08-10: qty 2

## 2016-08-10 MED ORDER — ACETAMINOPHEN 650 MG RE SUPP: 650.0000 mg | Freq: Four times a day (QID) | RECTAL | Status: DC | PRN

## 2016-08-10 MED ORDER — PROPOFOL 10 MG/ML IV BOLUS
INTRAVENOUS | Status: AC
  Filled 2016-08-10: qty 20

## 2016-08-10 MED ORDER — LIDOCAINE 2% (20 MG/ML) 5 ML SYRINGE
INTRAMUSCULAR | Status: AC
  Filled 2016-08-10: qty 10

## 2016-08-10 MED ORDER — ESMOLOL HCL 100 MG/10ML IV SOLN
INTRAVENOUS | Status: DC | PRN
  Administered 2016-08-10: 30 mg via INTRAVENOUS

## 2016-08-10 MED ORDER — SUGAMMADEX SODIUM 200 MG/2ML IV SOLN
INTRAVENOUS | Status: DC | PRN
Start: 2016-08-10 — End: 2016-08-10
  Administered 2016-08-10: 230 mg via INTRAVENOUS

## 2016-08-10 MED ORDER — PHENOL 1.4 % MT LIQD: 1.0000 | OROMUCOSAL | Status: DC | PRN

## 2016-08-10 MED ORDER — BUPIVACAINE HCL (PF) 0.5 % IJ SOLN
INTRAMUSCULAR | Status: DC | PRN
  Administered 2016-08-10: 30 mL

## 2016-08-10 MED ORDER — SODIUM CHLORIDE 0.9 % IJ SOLN
INTRAMUSCULAR | Status: DC | PRN
  Administered 2016-08-10: 30 mL

## 2016-08-10 MED ORDER — ROCURONIUM BROMIDE 50 MG/5ML IV SOSY
PREFILLED_SYRINGE | INTRAVENOUS | Status: AC
  Filled 2016-08-10: qty 5

## 2016-08-10 MED ORDER — SIMVASTATIN 20 MG PO TABS
20.0000 mg | ORAL_TABLET | Freq: Every day | ORAL | Status: DC
  Administered 2016-08-10: 20 mg via ORAL
  Filled 2016-08-10: qty 1

## 2016-08-10 MED ORDER — SUGAMMADEX SODIUM 500 MG/5ML IV SOLN
INTRAVENOUS | Status: AC
  Filled 2016-08-10: qty 5

## 2016-08-10 MED ORDER — EPHEDRINE 5 MG/ML INJ
INTRAVENOUS | Status: AC
  Filled 2016-08-10: qty 20

## 2016-08-10 MED ORDER — SENNA 8.6 MG PO TABS
2.0000 | ORAL_TABLET | Freq: Every day | ORAL | Status: DC
  Administered 2016-08-10: 17.2 mg via ORAL
  Filled 2016-08-10: qty 2

## 2016-08-10 MED ORDER — METOCLOPRAMIDE HCL 5 MG PO TABS: 5.0000 mg | ORAL_TABLET | Freq: Three times a day (TID) | ORAL | Status: DC | PRN

## 2016-08-10 MED ORDER — HYDROMORPHONE HCL 1 MG/ML IJ SOLN
INTRAMUSCULAR | Status: AC
  Filled 2016-08-10: qty 0.5

## 2016-08-10 MED ORDER — LACTATED RINGERS IV SOLN
INTRAVENOUS | Status: DC | PRN
  Administered 2016-08-10 (×2): via INTRAVENOUS

## 2016-08-10 MED ORDER — ASPIRIN 81 MG PO CHEW
81.0000 mg | CHEWABLE_TABLET | Freq: Two times a day (BID) | ORAL | Status: DC
  Administered 2016-08-10 – 2016-08-11 (×2): 81 mg via ORAL
  Filled 2016-08-10 (×2): qty 1

## 2016-08-10 MED ORDER — HYDROMORPHONE HCL 1 MG/ML IJ SOLN: 1.0000 mg | INTRAMUSCULAR | Status: DC | PRN

## 2016-08-10 MED ORDER — METOCLOPRAMIDE HCL 5 MG/ML IJ SOLN: 5.0000 mg | Freq: Three times a day (TID) | INTRAMUSCULAR | Status: DC | PRN

## 2016-08-10 MED ORDER — KETOROLAC TROMETHAMINE 30 MG/ML IJ SOLN
INTRAMUSCULAR | Status: DC | PRN
Start: 2016-08-10 — End: 2016-08-10
  Administered 2016-08-10: 30 mg via INTRAVENOUS

## 2016-08-10 MED ORDER — ALBUMIN HUMAN 5 % IV SOLN
INTRAVENOUS | Status: DC | PRN
  Administered 2016-08-10 (×2): via INTRAVENOUS

## 2016-08-10 MED ORDER — MENTHOL 3 MG MT LOZG
1.0000 | LOZENGE | OROMUCOSAL | Status: DC | PRN
  Administered 2016-08-10: 3 mg via ORAL
  Filled 2016-08-10: qty 9

## 2016-08-10 MED ORDER — FENTANYL CITRATE (PF) 250 MCG/5ML IJ SOLN
INTRAMUSCULAR | Status: AC
  Filled 2016-08-10: qty 5

## 2016-08-10 MED ORDER — BUSPIRONE HCL 10 MG PO TABS
15.0000 mg | ORAL_TABLET | Freq: Three times a day (TID) | ORAL | Status: DC
  Administered 2016-08-10 – 2016-08-11 (×3): 15 mg via ORAL
  Filled 2016-08-10 (×3): qty 1

## 2016-08-10 MED ORDER — DEXAMETHASONE SODIUM PHOSPHATE 10 MG/ML IJ SOLN
INTRAMUSCULAR | Status: DC | PRN
  Administered 2016-08-10: 10 mg via INTRAVENOUS

## 2016-08-10 MED ORDER — METHOCARBAMOL 500 MG PO TABS
500.0000 mg | ORAL_TABLET | Freq: Four times a day (QID) | ORAL | Status: DC | PRN
Start: 2016-08-10 — End: 2016-08-11
  Administered 2016-08-10 – 2016-08-11 (×2): 500 mg via ORAL
  Filled 2016-08-10 (×2): qty 1

## 2016-08-10 MED ORDER — PHENYLEPHRINE 40 MCG/ML (10ML) SYRINGE FOR IV PUSH (FOR BLOOD PRESSURE SUPPORT)
PREFILLED_SYRINGE | INTRAVENOUS | Status: AC
  Filled 2016-08-10: qty 10

## 2016-08-10 MED ORDER — FENTANYL CITRATE (PF) 100 MCG/2ML IJ SOLN
INTRAMUSCULAR | Status: DC | PRN
  Administered 2016-08-10 (×5): 50 ug via INTRAVENOUS
  Administered 2016-08-10: 100 ug via INTRAVENOUS
  Administered 2016-08-10: 150 ug via INTRAVENOUS

## 2016-08-10 MED ORDER — GABAPENTIN 300 MG PO CAPS
600.0000 mg | ORAL_CAPSULE | Freq: Three times a day (TID) | ORAL | Status: DC
  Administered 2016-08-10 – 2016-08-11 (×3): 600 mg via ORAL
  Filled 2016-08-10 (×3): qty 2

## 2016-08-10 MED ORDER — FENTANYL CITRATE (PF) 100 MCG/2ML IJ SOLN
25.0000 ug | INTRAMUSCULAR | Status: DC | PRN
  Administered 2016-08-10 (×2): 50 ug via INTRAVENOUS

## 2016-08-10 MED ORDER — SUCCINYLCHOLINE CHLORIDE 200 MG/10ML IV SOSY
PREFILLED_SYRINGE | INTRAVENOUS | Status: AC
  Filled 2016-08-10: qty 10

## 2016-08-10 MED ORDER — ALBUTEROL SULFATE (2.5 MG/3ML) 0.083% IN NEBU: 2.5000 mg | INHALATION_SOLUTION | Freq: Four times a day (QID) | RESPIRATORY_TRACT | Status: DC | PRN

## 2016-08-10 MED ORDER — OXYCODONE HCL 5 MG PO TABS
15.0000 mg | ORAL_TABLET | ORAL | Status: DC | PRN
  Administered 2016-08-10 – 2016-08-11 (×4): 20 mg via ORAL
  Filled 2016-08-10 (×5): qty 4

## 2016-08-10 MED ORDER — DEXAMETHASONE SODIUM PHOSPHATE 10 MG/ML IJ SOLN
INTRAMUSCULAR | Status: AC
  Filled 2016-08-10: qty 1

## 2016-08-10 MED ORDER — ACETAMINOPHEN 325 MG PO TABS: 650.0000 mg | ORAL_TABLET | Freq: Four times a day (QID) | ORAL | Status: DC | PRN

## 2016-08-10 MED ORDER — MIDAZOLAM HCL 2 MG/2ML IJ SOLN
INTRAMUSCULAR | Status: AC
Start: 2016-08-10 — End: 2016-08-10
  Filled 2016-08-10: qty 2

## 2016-08-10 MED ORDER — ONDANSETRON HCL 4 MG/2ML IJ SOLN
INTRAMUSCULAR | Status: DC | PRN
  Administered 2016-08-10: 4 mg via INTRAVENOUS

## 2016-08-10 MED ORDER — VANCOMYCIN HCL IN DEXTROSE 1-5 GM/200ML-% IV SOLN
INTRAVENOUS | Status: AC
  Filled 2016-08-10: qty 200

## 2016-08-10 MED ORDER — ROCURONIUM BROMIDE 100 MG/10ML IV SOLN
INTRAVENOUS | Status: DC | PRN
  Administered 2016-08-10: 50 mg via INTRAVENOUS
  Administered 2016-08-10 (×3): 20 mg via INTRAVENOUS
  Administered 2016-08-10: 10 mg via INTRAVENOUS

## 2016-08-10 MED ORDER — POLYETHYLENE GLYCOL 3350 17 G PO PACK: 17.0000 g | PACK | Freq: Every day | ORAL | Status: DC | PRN

## 2016-08-10 MED ORDER — ONDANSETRON HCL 4 MG PO TABS: 4.0000 mg | ORAL_TABLET | Freq: Four times a day (QID) | ORAL | Status: DC | PRN

## 2016-08-10 MED ORDER — KETOROLAC TROMETHAMINE 15 MG/ML IJ SOLN
15.0000 mg | Freq: Four times a day (QID) | INTRAMUSCULAR | Status: DC
  Administered 2016-08-10 – 2016-08-11 (×3): 15 mg via INTRAVENOUS
  Filled 2016-08-10 (×4): qty 1

## 2016-08-10 MED ORDER — LIDOCAINE HCL (CARDIAC) 20 MG/ML IV SOLN
INTRAVENOUS | Status: DC | PRN
  Administered 2016-08-10: 100 mg via INTRAVENOUS

## 2016-08-10 MED ORDER — VANCOMYCIN HCL IN DEXTROSE 1-5 GM/200ML-% IV SOLN
1000.0000 mg | Freq: Two times a day (BID) | INTRAVENOUS | Status: AC
  Administered 2016-08-10: 1000 mg via INTRAVENOUS
  Filled 2016-08-10: qty 200

## 2016-08-10 MED ORDER — DIPHENHYDRAMINE HCL 25 MG PO CAPS: 50.0000 mg | ORAL_CAPSULE | Freq: Every evening | ORAL | Status: DC | PRN

## 2016-08-10 MED ORDER — SUCCINYLCHOLINE CHLORIDE 20 MG/ML IJ SOLN
INTRAMUSCULAR | Status: DC | PRN
  Administered 2016-08-10: 120 mg via INTRAVENOUS

## 2016-08-10 MED ORDER — GLYCOPYRROLATE 0.2 MG/ML IJ SOLN
INTRAMUSCULAR | Status: DC | PRN
  Administered 2016-08-10: .4 mg via INTRAVENOUS

## 2016-08-10 MED ORDER — HYDROMORPHONE HCL 1 MG/ML IJ SOLN
0.2500 mg | INTRAMUSCULAR | Status: DC | PRN
  Administered 2016-08-10 (×2): 0.5 mg via INTRAVENOUS

## 2016-08-10 MED ORDER — FLUOXETINE HCL 20 MG PO CAPS
20.0000 mg | ORAL_CAPSULE | Freq: Every day | ORAL | Status: DC
  Administered 2016-08-11: 20 mg via ORAL
  Filled 2016-08-10: qty 1

## 2016-08-10 MED ORDER — ONDANSETRON HCL 4 MG/2ML IJ SOLN: 4.0000 mg | Freq: Four times a day (QID) | INTRAMUSCULAR | Status: DC | PRN

## 2016-08-10 MED ORDER — BUPIVACAINE HCL (PF) 0.5 % IJ SOLN
INTRAMUSCULAR | Status: AC
  Filled 2016-08-10: qty 30

## 2016-08-10 MED ORDER — VANCOMYCIN HCL 1000 MG IV SOLR
INTRAVENOUS | Status: DC | PRN
  Administered 2016-08-10: 1000 mg via INTRAVENOUS

## 2016-08-10 MED ORDER — FENTANYL CITRATE (PF) 100 MCG/2ML IJ SOLN
INTRAMUSCULAR | Status: AC
  Filled 2016-08-10: qty 2

## 2016-08-10 MED ORDER — DEXAMETHASONE SODIUM PHOSPHATE 10 MG/ML IJ SOLN
10.0000 mg | Freq: Once | INTRAMUSCULAR | Status: AC
  Administered 2016-08-11: 10 mg via INTRAVENOUS
  Filled 2016-08-10: qty 1

## 2016-08-10 MED ORDER — MIDAZOLAM HCL 5 MG/5ML IJ SOLN
INTRAMUSCULAR | Status: DC | PRN
  Administered 2016-08-10: 2 mg via INTRAVENOUS

## 2016-08-10 SURGICAL SUPPLY — 51 items
ALCOHOL ISOPROPYL (RUBBING) (MISCELLANEOUS) ×3 IMPLANT
BLADE CLIPPER SURG (BLADE) IMPLANT
CAPT HIP TOTAL 2 ×3 IMPLANT
CHLORAPREP W/TINT 26ML (MISCELLANEOUS) ×3 IMPLANT
COVER SURGICAL LIGHT HANDLE (MISCELLANEOUS) ×3 IMPLANT
DERMABOND ADVANCED (GAUZE/BANDAGES/DRESSINGS)
DERMABOND ADVANCED .7 DNX12 (GAUZE/BANDAGES/DRESSINGS) IMPLANT
DRAPE C-ARM 42X72 X-RAY (DRAPES) ×3 IMPLANT
DRAPE STERI IOBAN 125X83 (DRAPES) ×3 IMPLANT
DRAPE U-SHAPE 47X51 STRL (DRAPES) ×9 IMPLANT
DRSG AQUACEL AG ADV 3.5X10 (GAUZE/BANDAGES/DRESSINGS) ×3 IMPLANT
ELECT BLADE 4.0 EZ CLEAN MEGAD (MISCELLANEOUS) ×3
ELECT REM PT RETURN 9FT ADLT (ELECTROSURGICAL) ×3
ELECTRODE BLDE 4.0 EZ CLN MEGD (MISCELLANEOUS) ×1 IMPLANT
ELECTRODE REM PT RTRN 9FT ADLT (ELECTROSURGICAL) ×1 IMPLANT
EVACUATOR 1/8 PVC DRAIN (DRAIN) IMPLANT
GLOVE BIO SURGEON STRL SZ8.5 (GLOVE) ×6 IMPLANT
GLOVE BIOGEL PI IND STRL 8.5 (GLOVE) ×1 IMPLANT
GLOVE BIOGEL PI INDICATOR 8.5 (GLOVE) ×2
GOWN STRL REUS W/ TWL LRG LVL3 (GOWN DISPOSABLE) ×2 IMPLANT
GOWN STRL REUS W/TWL 2XL LVL3 (GOWN DISPOSABLE) ×3 IMPLANT
GOWN STRL REUS W/TWL LRG LVL3 (GOWN DISPOSABLE) ×4
HANDPIECE INTERPULSE COAX TIP (DISPOSABLE) ×2
HOOD PEEL AWAY FACE SHEILD DIS (HOOD) ×6 IMPLANT
KIT BASIN OR (CUSTOM PROCEDURE TRAY) ×3 IMPLANT
KIT ROOM TURNOVER OR (KITS) ×3 IMPLANT
MANIFOLD NEPTUNE II (INSTRUMENTS) ×3 IMPLANT
MARKER SKIN DUAL TIP RULER LAB (MISCELLANEOUS) ×3 IMPLANT
NEEDLE SPNL 18GX3.5 QUINCKE PK (NEEDLE) ×3 IMPLANT
NS IRRIG 1000ML POUR BTL (IV SOLUTION) ×3 IMPLANT
PACK TOTAL JOINT (CUSTOM PROCEDURE TRAY) ×3 IMPLANT
PACK UNIVERSAL I (CUSTOM PROCEDURE TRAY) ×3 IMPLANT
PAD ARMBOARD 7.5X6 YLW CONV (MISCELLANEOUS) ×6 IMPLANT
SAW OSC TIP CART 19.5X105X1.3 (SAW) ×3 IMPLANT
SEALER BIPOLAR AQUA 6.0 (INSTRUMENTS) ×3 IMPLANT
SET HNDPC FAN SPRY TIP SCT (DISPOSABLE) ×1 IMPLANT
SOLUTION BETADINE 4OZ (MISCELLANEOUS) IMPLANT
SUT ETHIBOND NAB CT1 #1 30IN (SUTURE) ×6 IMPLANT
SUT MNCRL AB 3-0 PS2 18 (SUTURE) ×3 IMPLANT
SUT MON AB 2-0 CT1 36 (SUTURE) ×3 IMPLANT
SUT VIC AB 1 CT1 27 (SUTURE) ×2
SUT VIC AB 1 CT1 27XBRD ANBCTR (SUTURE) ×1 IMPLANT
SUT VIC AB 2-0 CT1 27 (SUTURE) ×2
SUT VIC AB 2-0 CT1 TAPERPNT 27 (SUTURE) ×1 IMPLANT
SUT VLOC 180 0 24IN GS25 (SUTURE) ×3 IMPLANT
SYR 50ML LL SCALE MARK (SYRINGE) ×3 IMPLANT
TOWEL OR 17X24 6PK STRL BLUE (TOWEL DISPOSABLE) ×3 IMPLANT
TOWEL OR 17X26 10 PK STRL BLUE (TOWEL DISPOSABLE) IMPLANT
TRAY CATH 16FR W/PLASTIC CATH (SET/KITS/TRAYS/PACK) IMPLANT
TRAY FOLEY CATH 16FR SILVER (SET/KITS/TRAYS/PACK) IMPLANT
WATER STERILE IRR 1000ML POUR (IV SOLUTION) ×3 IMPLANT

## 2016-08-10 NOTE — Transfer of Care (Signed)
Immediate Anesthesia Transfer of Care Note  Patient: Russell Steele  Procedure(s) Performed: Procedure(s): RIGHT TOTAL HIP ARTHROPLASTY ANTERIOR APPROACH (Right)  Patient Location: PACU  Anesthesia Type:General  Level of Consciousness: awake, alert , oriented and patient cooperative  Airway & Oxygen Therapy: Patient Spontanous Breathing and Patient connected to nasal cannula oxygen  Post-op Assessment: Report given to RN and Post -op Vital signs reviewed and stable  Post vital signs: Reviewed and stable  Last Vitals:  Vitals:   08/10/16 0635 08/10/16 1101  BP: 125/82 (!) 101/52  Pulse: 70 94  Resp: 20 14  Temp: 37.1 C     Last Pain:  Vitals:   08/10/16 0712  TempSrc:   PainSc: 4       Patients Stated Pain Goal: 5 (08/10/16 1914)  Complications: No apparent anesthesia complications

## 2016-08-10 NOTE — Discharge Instructions (Signed)
°Dr. Eyla Tallon °Joint Replacement Specialist °[facility] °[address] °[place], [place] [address] °[phone] ° ° °TOTAL HIP REPLACEMENT POSTOPERATIVE DIRECTIONS ° ° ° °Hip Rehabilitation, Guidelines Following Surgery  ° °WEIGHT BEARING °Weight bearing as tolerated with assist device (walker, cane, etc) as directed, use it as long as suggested by your surgeon or therapist, typically at least 4-6 weeks. ° °The results of a hip operation are greatly improved after range of motion and muscle strengthening exercises. Follow all safety measures which are given to protect your hip. If any of these exercises cause increased pain or swelling in your joint, decrease the amount until you are comfortable again. Then slowly increase the exercises. Call your caregiver if you have problems or questions.  ° °HOME CARE INSTRUCTIONS  °Most of the following instructions are designed to prevent the dislocation of your new hip.  °Remove items at home which could result in a fall. This includes throw rugs or furniture in walking pathways.  °Continue medications as instructed at time of discharge. °· You may have some home medications which will be placed on hold until you complete the course of blood thinner medication. °· You may start showering once you are discharged home. Do not remove your dressing. °Do not put on socks or shoes without following the instructions of your caregivers.   °Sit on chairs with arms. Use the chair arms to help push yourself up when arising.  °Arrange for the use of a toilet seat elevator so you are not sitting low.  °· Walk with walker as instructed.  °You may resume a sexual relationship in one month or when given the OK by your caregiver.  °Use walker as long as suggested by your caregivers.  °You may put full weight on your legs and walk as much as is comfortable. °Avoid periods of inactivity such as sitting longer than an hour when not asleep. This helps prevent  blood clots.  °You may return to work once you are cleared by your surgeon.  °Do not drive a car for 6 weeks or until released by your surgeon.  °Do not drive while taking narcotics.  °Wear elastic stockings for two weeks following surgery during the day but you may remove then at night.  °Make sure you keep all of your appointments after your operation with all of your doctors and caregivers. You should call the office at the above phone number and make an appointment for approximately two weeks after the date of your surgery. °Please pick up a stool softener and laxative for home use as long as you are requiring pain medications. °· ICE to the affected hip every three hours for 30 minutes at a time and then as needed for pain and swelling. Continue to use ice on the hip for pain and swelling from surgery. You may notice swelling that will progress down to the foot and ankle.  This is normal after surgery.  Elevate the leg when you are not up walking on it.   °It is important for you to complete the blood thinner medication as prescribed by your doctor. °· Continue to use the breathing machine which will help keep your temperature down.  It is common for your temperature to cycle up and down following surgery, especially at night when you are not up moving around and exerting yourself.  The breathing machine keeps your lungs expanded and your temperature down. ° °RANGE OF MOTION AND STRENGTHENING EXERCISES  °These exercises are   designed to help you keep full movement of your hip joint. Follow your caregiver's or physical therapist's instructions. Perform all exercises about fifteen times, three times per day or as directed. Exercise both hips, even if you have had only one joint replacement. These exercises can be done on a training (exercise) mat, on the floor, on a table or on a bed. Use whatever works the best and is most comfortable for you. Use music or television while you are exercising so that the exercises  are a pleasant break in your day. This will make your life better with the exercises acting as a break in routine you can look forward to.  °Lying on your back, slowly slide your foot toward your buttocks, raising your knee up off the floor. Then slowly slide your foot back down until your leg is straight again.  °Lying on your back spread your legs as far apart as you can without causing discomfort.  °Lying on your side, raise your upper leg and foot straight up from the floor as far as is comfortable. Slowly lower the leg and repeat.  °Lying on your back, tighten up the muscle in the front of your thigh (quadriceps muscles). You can do this by keeping your leg straight and trying to raise your heel off the floor. This helps strengthen the largest muscle supporting your knee.  °Lying on your back, tighten up the muscles of your buttocks both with the legs straight and with the knee bent at a comfortable angle while keeping your heel on the floor.  ° °SKILLED REHAB INSTRUCTIONS: °If the patient is transferred to a skilled rehab facility following release from the hospital, a list of the current medications will be sent to the facility for the patient to continue.  When discharged from the skilled rehab facility, please have the facility set up the patient's Home Health Physical Therapy prior to being released. Also, the skilled facility will be responsible for providing the patient with their medications at time of release from the facility to include their pain medication and their blood thinner medication. If the patient is still at the rehab facility at time of the two week follow up appointment, the skilled rehab facility will also need to assist the patient in arranging follow up appointment in our office and any transportation needs. ° °MAKE SURE YOU:  °Understand these instructions.  °Will watch your condition.  °Will get help right away if you are not doing well or get worse. ° °Pick up stool softner and  laxative for home use following surgery while on pain medications. °Do not remove your dressing. °The dressing is waterproof--it is OK to take showers. °Continue to use ice for pain and swelling after surgery. °Do not use any lotions or creams on the incision until instructed by your surgeon. °Total Hip Protocol. ° ° °

## 2016-08-10 NOTE — Anesthesia Preprocedure Evaluation (Addendum)
Anesthesia Evaluation  Patient identified by MRN, date of birth, ID band Patient awake    Reviewed: Allergy & Precautions, NPO status , Patient's Chart, lab work & pertinent test results  History of Anesthesia Complications Negative for: history of anesthetic complications  Airway Mallampati: I  TM Distance: >3 FB Neck ROM: Full    Dental  (+) Edentulous Upper, Edentulous Lower   Pulmonary sleep apnea , former smoker,    breath sounds clear to auscultation       Cardiovascular negative cardio ROS   Rhythm:Regular Rate:Normal     Neuro/Psych negative neurological ROS  negative psych ROS   GI/Hepatic negative GI ROS, Neg liver ROS,   Endo/Other  negative endocrine ROSMorbid obesity  Renal/GU negative Renal ROS  negative genitourinary   Musculoskeletal  (+) Arthritis ,   Abdominal (+) + obese,   Peds negative pediatric ROS (+)  Hematology negative hematology ROS (+)   Anesthesia Other Findings   Reproductive/Obstetrics negative OB ROS                            Anesthesia Physical Anesthesia Plan  ASA: II  Anesthesia Plan: General   Post-op Pain Management:    Induction: Intravenous  Airway Management Planned: Oral ETT  Additional Equipment:   Intra-op Plan:   Post-operative Plan: Extubation in OR  Informed Consent: I have reviewed the patients History and Physical, chart, labs and discussed the procedure including the risks, benefits and alternatives for the proposed anesthesia with the patient or authorized representative who has indicated his/her understanding and acceptance.   Dental advisory given  Plan Discussed with:   Anesthesia Plan Comments:         Anesthesia Quick Evaluation

## 2016-08-10 NOTE — Anesthesia Postprocedure Evaluation (Addendum)
Anesthesia Post Note  Patient: Russell Steele  Procedure(s) Performed: Procedure(s) (LRB): RIGHT TOTAL HIP ARTHROPLASTY ANTERIOR APPROACH (Right)  Patient location during evaluation: PACU Anesthesia Type: General Level of consciousness: awake, sedated and oriented Pain management: pain level controlled Vital Signs Assessment: post-procedure vital signs reviewed and stable Respiratory status: spontaneous breathing, nonlabored ventilation, respiratory function stable and patient connected to nasal cannula oxygen Cardiovascular status: blood pressure returned to baseline and stable Postop Assessment: no signs of nausea or vomiting Anesthetic complications: no       Last Vitals:  Vitals:   08/10/16 1132 08/10/16 1145  BP: 128/65   Pulse: (!) 111 (!) 102  Resp: 15 (!) 27  Temp:      Last Pain:  Vitals:   08/10/16 1145  TempSrc:   PainSc: 7                  Kriste Broman,JAMES TERRILL

## 2016-08-10 NOTE — Interval H&P Note (Signed)
History and Physical Interval Note:  08/10/2016 7:41 AM  Russell Steele  has presented today for surgery, with the diagnosis of Avascular necrosis right hip with collapse  The various methods of treatment have been discussed with the patient and family. After consideration of risks, benefits and other options for treatment, the patient has consented to  Procedure(s): RIGHT TOTAL HIP ARTHROPLASTY ANTERIOR APPROACH (Right) as a surgical intervention .  The patient's history has been reviewed, patient examined, no change in status, stable for surgery.  I have reviewed the patient's chart and labs.  Questions were answered to the patient's satisfaction.     Pacen Watford, Cloyde Reams

## 2016-08-10 NOTE — Op Note (Signed)
OPERATIVE REPORT  SURGEON: Samson Frederic, MD   ASSISTANT: Hart Carwin, RNFA.  PREOPERATIVE DIAGNOSIS: Right hip avascular necrosis.   POSTOPERATIVE DIAGNOSIS: Right hip avascular necrosis.   PROCEDURE: Right total hip arthroplasty, anterior approach.   IMPLANTS: DePuy Tri Lock stem, size 8, hi offset. DePuy Pinnacle Cup, size 60 mm. DePuy Altrx liner, size 36 by 60 mm, neutral. DePuy Biolox ceramic head ball, size 36 + 1.5 mm.  ANESTHESIA:  General  ESTIMATED BLOOD LOSS: 600 mL.  ANTIBIOTICS: 1 g vancomycin.  DRAINS: None.  COMPLICATIONS: None.   CONDITION: PACU - hemodynamically stable.   BRIEF CLINICAL NOTE: Russell Steele is a 61 y.o. 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 After failing conservative management, the patient was indicated for total hip arthroplasty. The risks, benefits, and alternatives to the procedure were explained, and the patient elected to proceed.  PROCEDURE IN DETAIL: Surgical site was marked by myself in the pre-op holding area. Once inside the operating room, general  anesthesia was obtained. The patient was then positioned on the Hana table. All bony prominences were well padded. The hip was prepped and draped in the normal sterile surgical fashion. A time-out was called verifying side and site of surgery. The patient received IV antibiotics within 60 minutes of beginning the procedure.  The direct anterior approach to the hip was performed through the Hueter interval. Lateral femoral circumflex vessels were treated with the Auqumantys. The anterior capsule was exposed and an inverted T capsulotomy was made.The femoral neck cut was made to the level of the templated cut. A corkscrew was placed into the head and the head was removed. The femoral head was found to have eburnated bone. The head was passed to the back table and was measured.  Acetabular exposure was achieved, and the  pulvinar and labrum were excised. Sequental reaming of the acetabulum was then performed up to a size 59 mm reamer. A 60 mm cup was then opened and impacted into place at approximately 40 degrees of abduction and 20 degrees of anteversion. The final polyethylene liner was impacted into place and acetabular osteophytes were removed.   I then gained femoral exposure taking care to protect the abductors and greater trochanter. This was performed using standard external rotation, extension, and adduction. The capsule was peeled off the inner aspect of the greater trochanter, taking care to preserve the short external rotators. A cookie cutter was used to enter the femoral canal, and then the femoral canal finder was placed. Sequential broaching was performed up to a size 8. Calcar planer was used on the femoral neck remnant. I placed a hi offset neck and a trial head ball. The hip was reduced. Leg lengths and offset were checked fluoroscopically. The hip was dislocated and trial components were removed. The final implants were placed, and the hip was reduced.  Fluoroscopy was used to confirm component position and leg lengths. At 90 degrees of external rotation and full extension, the hip was stable to an anterior directed force.  The wound was copiously irrigated with normal saline using pulse lavage. Marcaine solution was injected into the periarticular soft tissue. The wound was closed in layers using #1 Vicryl and V-Loc for the fascia, 2-0 Vicryl for the subcutaneous fat, 2-0 Monocryl for the deep dermal layer, 3-0 running Monocryl subcuticular stitch, and Dermabond for the skin. Once the glue was fully dried, an Aquacell Ag dressing was applied. The patient was transported to the recovery room in  stable condition. Sponge, needle, and instrument counts were correct at the end of the case x2. The patient tolerated the procedure well and there were no known complications.

## 2016-08-10 NOTE — Evaluation (Signed)
Physical Therapy Evaluation Patient Details Name: Russell Steele MRN: [id number] DOB: 1955-10-15 Today's Date: 08/10/2016   History of Present Illness  Admitted for RTHA, Direct Anterior;  has a past medical history of Anxiety; Arthritis; Depression; History of kidney stones; and Sleep apnea.  has a past surgical history that includes  Back surgery; Knee arthroscopy; Amputation toe  Clinical Impression   Pt is s/p THA resulting in the deficits listed below (see PT Problem List). Overall moving well; I anticipate good progress; dc home tomorrow is not unreasonable;  Pt will benefit from skilled PT to increase their independence and safety with mobility to allow discharge to the venue listed below.      Follow Up Recommendations Home health PT; will monitor; may not need HHPT, but noted the person who will primarily be helping pt and his wife has bronchitis (per pt)    Equipment Recommendations  Rolling walker with 5" wheels;3in1 (PT)    Recommendations for Other Services       Precautions / Restrictions Precautions Precautions: None Restrictions Weight Bearing Restrictions: Yes RLE Weight Bearing: Weight bearing as tolerated      Mobility  Bed Mobility Overal bed mobility: Needs Assistance Bed Mobility: Supine to Sit     Supine to sit: Min assist     General bed mobility comments: Cues for technique  Transfers Overall transfer level: Needs assistance Equipment used: Rolling walker (2 wheeled) Transfers: Sit to/from Stand Sit to Stand: Min guard         General transfer comment: Cues for hand placement and safety  Ambulation/Gait Ambulation/Gait assistance: Min guard Ambulation Distance (Feet): 60 Feet Assistive device: Rolling walker (2 wheeled) Gait Pattern/deviations: Step-through pattern;Decreased step length - left;Decreased stance time - left     General Gait Details: Cues to self-monitor for activity tolerance  Stairs            Wheelchair  Mobility    Modified Rankin (Stroke Patients Only)       Balance                                             Pertinent Vitals/Pain Pain Assessment: 0-10 Pain Score: 5  Pain Location: R hip Pain Descriptors / Indicators: Aching Pain Intervention(s): Monitored during session    Home Living Family/patient expects to be discharged to:: Private residence Living Arrangements: Spouse/significant other Available Help at Discharge: Family Type of Home: House Home Access: Ramped entrance     Home Layout: One level Home Equipment: Grab bars - tub/shower;Walker - standard;Wheelchair - manual      Prior Function Level of Independence: Independent         Comments: AD prn due to OA pain     Hand Dominance        Extremity/Trunk Assessment   Upper Extremity Assessment Upper Extremity Assessment: Overall WFL for tasks assessed    Lower Extremity Assessment Lower Extremity Assessment: RLE deficits/detail RLE Deficits / Details: Grossly decr AROM and strength postop; incr pain with WB R hip       Communication   Communication: No difficulties  Cognition Arousal/Alertness: Awake/alert Behavior During Therapy: WFL for tasks assessed/performed Overall Cognitive Status: Within Functional Limits for tasks assessed  General Comments      Exercises     Assessment/Plan    PT Assessment Patient needs continued PT services  PT Problem List Decreased strength;Decreased range of motion;Decreased activity tolerance;Decreased balance;Decreased mobility;Decreased knowledge of use of DME;Pain       PT Treatment Interventions DME instruction;Gait training;Stair training;Functional mobility training;Therapeutic activities;Therapeutic exercise;Patient/family education    PT Goals (Current goals can be found in the Care Plan section)  Acute Rehab PT Goals Patient Stated Goal: walk without pain PT Goal  Formulation: With patient Time For Goal Achievement: 08/24/16 Potential to Achieve Goals: Good    Frequency 7X/week   Barriers to discharge        Co-evaluation               End of Session Equipment Utilized During Treatment: Gait belt Activity Tolerance: Patient tolerated treatment well Patient left: in chair;with call bell/phone within reach Nurse Communication: Mobility status PT Visit Diagnosis: Other abnormalities of gait and mobility (R26.89);Pain Pain - Right/Left: Right Pain - part of body: Hip    Time: 0981-1914 PT Time Calculation (min) (ACUTE ONLY): 15 min   Charges:   PT Evaluation $PT Eval Low Complexity: 1 Procedure     PT G Codes:        Van Clines, PT  Acute Rehabilitation Services Pager [phone] Office [phone]   Levi Aland 08/10/2016, 4:37 PM

## 2016-08-10 NOTE — H&P (View-Only) (Signed)
TOTAL HIP ADMISSION H&P  Patient is admitted for right total hip arthroplasty.  Subjective:  Chief Complaint: right hip pain  HPI: Russell Steele, 61 y.o. male, has a history of pain and functional disability in the right hip(s) due to AVN and patient has failed non-surgical conservative treatments for greater than 12 weeks to include NSAID's and/or analgesics, flexibility and strengthening excercises, use of assistive devices, weight reduction as appropriate and activity modification.  Onset of symptoms was gradual starting 3 years ago with rapidlly worsening course since that time.The patient noted no past surgery on the right hip(s).  Patient currently rates pain in the right hip at 10 out of 10 with activity. Patient has night pain, worsening of pain with activity and weight bearing, trendelenberg gait, pain that interfers with activities of daily living, pain with passive range of motion, crepitus and joint swelling. Patient has evidence of subchondral cysts, subchondral sclerosis, joint space narrowing and AVN with collapse by imaging studies. This condition presents safety issues increasing the risk of falls. There is no current active infection.  There are no active problems to display for this patient.  No past medical history on file.  Past Surgical History:  Procedure Laterality Date  . AMPUTATION TOE    . APPENDECTOMY    . BACK SURGERY    . DENTAL SURGERY    . KNEE ARTHROSCOPY       (Not in a hospital admission) Allergies  Allergen Reactions  . Penicillins Anaphylaxis    Social History  Substance Use Topics  . Smoking status: Current Every Day Smoker    Packs/day: 0.50    Types: Cigarettes  . Smokeless tobacco: Never Used  . Alcohol use 1.8 oz/week    3 Cans of beer per week    No family history on file.   Review of Systems  Constitutional: Negative.   HENT: Positive for tinnitus.   Eyes: Negative.   Respiratory: Positive for shortness of breath.   Cardiovascular:  Negative.   Gastrointestinal: Negative.   Musculoskeletal: Positive for back pain and joint pain.  Skin: Negative.   Neurological: Negative.   Endo/Heme/Allergies: Negative.   Psychiatric/Behavioral: Negative.     Objective:  Physical Exam  Vitals reviewed. Constitutional: He is oriented to person, place, and time. He appears well-developed and well-nourished.  HENT:  Head: Normocephalic and atraumatic.  Eyes: Conjunctivae and EOM are normal. Pupils are equal, round, and reactive to light.  Neck: Normal range of motion. Neck supple.  Cardiovascular: Normal rate, regular rhythm and intact distal pulses.   Respiratory: Effort normal. No respiratory distress.  GI: He exhibits no distension.  Genitourinary:  Genitourinary Comments: deferred  Musculoskeletal:       Right hip: He exhibits decreased range of motion, bony tenderness and crepitus.  Neurological: He is alert and oriented to person, place, and time. He has normal reflexes.  Skin: Skin is warm and dry.  Psychiatric: He has a normal mood and affect. His behavior is normal. Judgment and thought content normal.    Vital signs in last 24 hours: @  Labs:   Estimated body mass index is 31.87 kg/m as calculated from the following:   Height as of 02/21/16:  (1.905 m).   Weight as of 02/21/16: 115.7 kg (255 lb).   Imaging Review Plain radiographs demonstrate severe degenerative joint disease of the right hip(s). The bone quality appears to be adequate for age and reported activity level.  Assessment/Plan:  AVN, right hip(s)  The patient  history, physical examination, clinical judgement of the provider and imaging studies are consistent with end stage degenerative joint disease of the right hip(s) and total hip arthroplasty is deemed medically necessary. The treatment options including medical management, injection therapy, arthroscopy and arthroplasty were discussed at length. The risks and benefits of total  hip arthroplasty were presented and reviewed. The risks due to aseptic loosening, infection, stiffness, dislocation/subluxation,  thromboembolic complications and other imponderables were discussed.  The patient acknowledged the explanation, agreed to proceed with the plan and consent was signed. Patient is being admitted for inpatient treatment for surgery, pain control, PT, OT, prophylactic antibiotics, VTE prophylaxis, progressive ambulation and ADL's and discharge planning.The patient is planning to be discharged home with home health services

## 2016-08-10 NOTE — Anesthesia Procedure Notes (Addendum)
Procedure Name: Intubation Date/Time: 08/10/2016 8:00 AM Performed by: Salli Quarry Yonah Tangeman Pre-anesthesia Checklist: Patient identified, Emergency Drugs available, Suction available and Patient being monitored Patient Re-evaluated:Patient Re-evaluated prior to inductionOxygen Delivery Method: Circle System Utilized Preoxygenation: Pre-oxygenation with 100% oxygen Intubation Type: IV induction Ventilation: Mask ventilation without difficulty Laryngoscope Size: Mac and 4 Grade View: Grade I Tube type: Oral Tube size: 7.5 mm Number of attempts: 1 Airway Equipment and Method: Stylet and Oral airway Placement Confirmation: ETT inserted through vocal cords under direct vision,  positive ETCO2 and breath sounds checked- equal and bilateral Secured at: 24 cm Tube secured with: Tape Dental Injury: Teeth and Oropharynx as per pre-operative assessment  Comments: Intubation by Richardson Chiquito Student

## 2016-08-11 ENCOUNTER — Encounter (HOSPITAL_COMMUNITY): Payer: Self-pay | Admitting: Orthopedic Surgery

## 2016-08-11 LAB — BASIC METABOLIC PANEL
ANION GAP: 8 (ref 5–15)
BUN: 12 mg/dL (ref 6–20)
CALCIUM: 8.7 mg/dL — AB (ref 8.9–10.3)
CO2: 28 mmol/L (ref 22–32)
Chloride: 99 mmol/L — ABNORMAL LOW (ref 101–111)
Creatinine, Ser: 1.19 mg/dL (ref 0.61–1.24)
Glucose, Bld: 134 mg/dL — ABNORMAL HIGH (ref 65–99)
POTASSIUM: 4.9 mmol/L (ref 3.5–5.1)
SODIUM: 135 mmol/L (ref 135–145)

## 2016-08-11 LAB — CBC
HCT: 27.4 % — ABNORMAL LOW (ref 39.0–52.0)
Hemoglobin: 9.2 g/dL — ABNORMAL LOW (ref 13.0–17.0)
MCH: 32.6 pg (ref 26.0–34.0)
MCHC: 33.6 g/dL (ref 30.0–36.0)
MCV: 97.2 fL (ref 78.0–100.0)
PLATELETS: 183 10*3/uL (ref 150–400)
RBC: 2.82 MIL/uL — AB (ref 4.22–5.81)
RDW: 13.5 % (ref 11.5–15.5)
WBC: 9.7 10*3/uL (ref 4.0–10.5)

## 2016-08-11 MED ORDER — ONDANSETRON HCL 4 MG PO TABS: 4.0000 mg | ORAL_TABLET | Freq: Four times a day (QID) | ORAL | 0 refills | Status: DC | PRN

## 2016-08-11 MED ORDER — ORAL CARE MOUTH RINSE
15.0000 mL | Freq: Two times a day (BID) | OROMUCOSAL | Status: DC
  Administered 2016-08-11 (×2): 15 mL via OROMUCOSAL

## 2016-08-11 MED ORDER — OXYCODONE HCL 5 MG PO TABS: 15.0000 mg | ORAL_TABLET | ORAL | 0 refills | Status: DC | PRN

## 2016-08-11 MED ORDER — SENNA 8.6 MG PO TABS: 2.0000 | ORAL_TABLET | Freq: Every day | ORAL | 0 refills | Status: DC

## 2016-08-11 MED ORDER — ASPIRIN 81 MG PO CHEW: 81.0000 mg | CHEWABLE_TABLET | Freq: Two times a day (BID) | ORAL | 1 refills | Status: DC

## 2016-08-11 MED ORDER — DOCUSATE SODIUM 100 MG PO CAPS: 100.0000 mg | ORAL_CAPSULE | Freq: Two times a day (BID) | ORAL | 1 refills | Status: AC

## 2016-08-11 NOTE — Care Management Note (Signed)
Case Management Note  Patient Details  Name: Russell Steele MRN: [id number] Date of Birth: 24-Jun-1955  Subjective/Objective:   61 yr old gentleman s/p right total hip arthroplasty, anterior approach.                  Action/Plan: Case manager spoke with patient concerning Home Health and DME needs. Patient was preoperatively setup with [facility], no changes. Patient states that he has a rolling walker, a rollator,  2 canes and a  Motorized wheelchair. Patient's niece and her husband will assist him at discharge.     Expected Discharge Date:  08/11/16               Expected Discharge Plan:  Home w Home Health Services  In-House Referral:  NA  Discharge planning Services  CM Consult  Post Acute Care Choice:  Home Health Choice offered to:  Patient  DME Arranged:  N/A (Patient has RW, 3in1, canews, motorized  wheelchair and a rollator) DME Agency:  NA  HH Arranged:  PT HH Agency:  [facility] (formerly [facility])  Status of Service:  Completed, signed off  If discussed at [employer], dates discussed:    Additional Comments:  Durenda Guthrie, RN 08/11/2016, 11:00 AM

## 2016-08-11 NOTE — Progress Notes (Signed)
Discharge teaching complete. Patient understood well. Removed patients IV, bled a little. PT signed off on patient, has all equipment needed. Patient waiting on ride to go home.

## 2016-08-11 NOTE — Progress Notes (Signed)
   Subjective:  Patient reports pain as mild to moderate.  Hip feels better already. Denies N/V/CP/SOB.  Objective:   VITALS:   Vitals:   08/10/16 2130 08/10/16 2200 08/11/16 0141 08/11/16 0430  BP:  (!) 124/49 112/71 114/69  Pulse: 87  70 68  Resp: Temp:  98.9 F (37.2 C) 98.9 F (37.2 C) 98.6 F (37 C)  TempSrc:  Axillary Axillary Oral  SpO2: 98% 95% 96% 98%  Weight:      Height:        NAD ABD soft Sensation intact distally Intact pulses distally Dorsiflexion/Plantar flexion intact Incision: dressing C/D/I Compartment soft   Lab Results  Component Value Date   WBC 9.7 08/11/2016   HGB 9.2 (L) 08/11/2016   HCT 27.4 (L) 08/11/2016   MCV 97.2 08/11/2016   PLT 183 08/11/2016   BMET    Component Value Date/Time   NA 135 08/11/2016 0551   K 4.9 08/11/2016 0551   CL 99 (L) 08/11/2016 0551   CO2 28 08/11/2016 0551   GLUCOSE 134 (H) 08/11/2016 0551   BUN 12 08/11/2016 0551   CREATININE 1.19 08/11/2016 0551   CALCIUM 8.7 (L) 08/11/2016 0551   GFRNONAA >60 08/11/2016 0551   GFRAA >60 08/11/2016 0551     Assessment/Plan: 1 Day Post-Op   Principal Problem:   Avascular necrosis of hip, right (HCC)   WBAT with walker ASA, SCDs, TEDs PT/OT PO pain control ABLA: stable, monitor Dispo: D/C home with HHPT   Hason Ofarrell, Cloyde Reams 08/11/2016, 7:55 AM   Samson Frederic, MD Cell [phone]

## 2016-08-11 NOTE — Progress Notes (Signed)
qPhysical Therapy Treatment Patient Details Name: Russell Steele MRN: [id number] DOB: 1956-04-11 Today's Date: 08/11/2016    History of Present Illness Admitted for RTHA, Direct Anterior;  has a past medical history of Anxiety; Arthritis; Depression; History of kidney stones; and Sleep apnea.  has a past surgical history that includes  Back surgery; Knee arthroscopy; Amputation toe    PT Comments    Overall managing very well and OK for dc home from PT standpoint; RN notified; Will focus next session on therex if given the opportunity to work with him again   Follow Up Recommendations  Home health PT     Equipment Recommendations  Rolling walker with 5" wheels;3in1 (PT)    Recommendations for Other Services       Precautions / Restrictions Precautions Precautions: None Restrictions Weight Bearing Restrictions: Yes RLE Weight Bearing: Weight bearing as tolerated    Mobility  Bed Mobility Overal bed mobility: Needs Assistance Bed Mobility: Supine to Sit     Supine to sit: Supervision     General bed mobility comments: Cues for technique  Transfers Overall transfer level: Needs assistance Equipment used: Straight cane Transfers: Sit to/from Stand Sit to Stand: Supervision         General transfer comment: Cues to self-monitor for activity tolerance  Ambulation/Gait Ambulation/Gait assistance: Supervision Ambulation Distance (Feet): 500 Feet Assistive device: Straight cane Gait Pattern/deviations: Step-through pattern Gait velocity: approaching WNL   General Gait Details: Cues to self-monitor for activity tolerance   Stairs Stairs: Yes   Stair Management: One rail Right;Step to pattern;Forwards;With cane Number of Stairs: 5 General stair comments: No difficulty noted  Wheelchair Mobility    Modified Rankin (Stroke Patients Only)       Balance                                            Cognition Arousal/Alertness:  Awake/alert Behavior During Therapy: WFL for tasks assessed/performed Overall Cognitive Status: Within Functional Limits for tasks assessed                                        Exercises      General Comments General comments (skin integrity, edema, etc.): Discussed car transfers; questions solicited and answered      Pertinent Vitals/Pain Pain Assessment: 0-10 Pain Score: 6  Pain Descriptors / Indicators: Aching Pain Intervention(s): Monitored during session    Home Living                      Prior Function            PT Goals (current goals can now be found in the care plan section) Acute Rehab PT Goals Patient Stated Goal: walk without pain PT Goal Formulation: With patient Time For Goal Achievement: 08/24/16 Potential to Achieve Goals: Good Progress towards PT goals: Progressing toward goals    Frequency    7X/week      PT Plan Current plan remains appropriate    Co-evaluation             End of Session Equipment Utilized During Treatment: Gait belt Activity Tolerance: Patient tolerated treatment well Patient left: in chair;with call bell/phone within reach Nurse Communication: Mobility status PT Visit Diagnosis: Other abnormalities of gait and mobility (R26.89);Pain  Pain - Right/Left: Right Pain - part of body: Hip     Time: 1000-1025 (end time is approximate) PT Time Calculation (min) (ACUTE ONLY): 25 min  Charges:  $Gait Training: 23-37 mins                    G Codes:       Van Clines, PT  Acute Rehabilitation Services Pager [phone] Office [phone]    Levi Aland 08/11/2016, 11:18 AM

## 2016-08-11 NOTE — Discharge Summary (Signed)
Physician Discharge Summary  Patient ID: Russell Steele MRN: [id number] DOB/AGE: [birth year] 59 y.o.  Admit date: 08/10/2016 Discharge date: 08/11/2016  Admission Diagnoses:  Avascular necrosis of hip, right [facility])  Discharge Diagnoses:  Principal Problem:   Avascular necrosis of hip, right [facility])   Past Medical History:  Diagnosis Date  . Anxiety   . Arthritis   . Depression   . History of kidney stones   . Sleep apnea     Surgeries: Procedure(s): RIGHT TOTAL HIP ARTHROPLASTY ANTERIOR APPROACH on 08/10/2016   Consultants (if any):   Discharged Condition: Improved  Hospital Course: Russell Steele is an 61 y.o. male who was admitted 08/10/2016 with a diagnosis of Avascular necrosis of hip, right ([place]) and went to the operating room on 08/10/2016 and underwent the above named procedures.    He was given perioperative antibiotics:  Anti-infectives    Start     Dose/Rate Route Frequency Ordered Stop   08/10/16 1900  vancomycin (VANCOCIN) IVPB 1000 mg/200 mL premix     1,000 mg 200 mL/hr over 60 Minutes Intravenous Every 12 hours 08/10/16 1334 08/10/16 1912   08/10/16 0656  vancomycin (VANCOCIN) 1-5 GM/200ML-% IVPB    Comments:  Maryjean Ka   : cabinet override      08/10/16 0656 08/10/16 1859    .  He was given sequential compression devices, early ambulation, and ASA for DVT prophylaxis.  He benefited maximally from the hospital stay and there were no complications.    Recent vital signs:  Vitals:   08/11/16 0141 08/11/16 0430  BP: 112/71 114/69  Pulse: 70 68  Resp: 16 18  Temp: 98.9 F (37.2 C) 98.6 F (37 C)    Recent laboratory studies:  Lab Results  Component Value Date   HGB 9.2 (L) 08/11/2016   HGB 13.9 07/30/2016   HGB 14.6 11/21/2006   Lab Results  Component Value Date   WBC 9.7 08/11/2016   PLT 183 08/11/2016   No results found for: INR Lab Results  Component Value Date   NA 135 08/11/2016   K 4.9 08/11/2016   CL 99 (L) 08/11/2016   CO2 28  08/11/2016   BUN 12 08/11/2016   CREATININE 1.19 08/11/2016   GLUCOSE 134 (H) 08/11/2016    Discharge Medications:   Allergies as of 08/11/2016      Reactions   Bee Venom Anaphylaxis      Medication List    TAKE these medications   acetaminophen 500 MG tablet Commonly known as:  TYLENOL Take 1,000 mg by mouth every 6 (six) hours as needed for mild pain.   albuterol 108 (90 Base) MCG/ACT inhaler Commonly known as:  PROVENTIL HFA;VENTOLIN HFA Inhale 2 puffs into the lungs every 6 (six) hours as needed for wheezing or shortness of breath.   aspirin 81 MG chewable tablet Chew 1 tablet (81 mg total) by mouth 2 (two) times daily.   busPIRone 15 MG tablet Commonly known as:  BUSPAR Take 15 mg by mouth 3 (three) times daily.   diphenhydrAMINE 25 mg capsule Commonly known as:  BENADRYL Take 50 mg by mouth at bedtime as needed for allergies or sleep.   docusate sodium 100 MG capsule Commonly known as:  COLACE Take 1 capsule (100 mg total) by mouth 2 (two) times daily.   FLUoxetine 20 MG capsule Commonly known as:  PROZAC Take 20 mg by mouth daily.   gabapentin 300 MG capsule Commonly known as:  NEURONTIN Take 600 mg by  mouth 3 (three) times daily.   multivitamin with minerals Tabs tablet Take 1 tablet by mouth daily.   mupirocin ointment 2 % Commonly known as:  BACTROBAN Place 1 application into the nose 2 (two) times daily as needed (Wound(chin) infection).   ondansetron 4 MG tablet Commonly known as:  ZOFRAN Take 1 tablet (4 mg total) by mouth every 6 (six) hours as needed for nausea.   oxyCODONE 5 MG immediate release tablet Commonly known as:  Oxy IR/ROXICODONE Take 3-4 tablets (15-20 mg total) by mouth every 3 (three) hours as needed for breakthrough pain. What changed:  medication strength  how much to take  when to take this  reasons to take this   oxymetazoline 0.05 % nasal spray Commonly known as:  AFRIN Place 1 spray into both nostrils 2 (two)  times daily as needed for congestion.   senna 8.6 MG Tabs tablet Commonly known as:  SENOKOT Take 2 tablets (17.2 mg total) by mouth at bedtime.   simvastatin 20 MG tablet Commonly known as:  ZOCOR Take 20 mg by mouth at bedtime.   tamsulosin 0.4 MG Caps capsule Commonly known as:  FLOMAX Take 0.4 mg by mouth at bedtime.   Testosterone Cypionate 200 MG/ML Kit Inject 300 mg into the muscle every 28 (twenty-eight) days.       Diagnostic Studies: Dg Pelvis Portable  Result Date: 08/10/2016 CLINICAL DATA:  Post right total hip arthroplasty EXAM: PORTABLE PELVIS 1-2 VIEWS COMPARISON:  None. FINDINGS: Changes of right hip replacement. Normal AP alignment. No hardware or bony complicating feature. IMPRESSION: Right hip replacement.  No visible complicating feature. Electronically Signed   By: Rolm Baptise M.D.   On: 08/10/2016 11:44   Dg C-arm 61-120 Min  Result Date: 08/10/2016 CLINICAL DATA:  Intraoperative imaging for right hip replacement. EXAM: OPERATIVE RIGHT HIP (WITH PELVIS IF PERFORMED) 2 VIEWS TECHNIQUE: Fluoroscopic spot image(s) were submitted for interpretation post-operatively. COMPARISON:  None. FINDINGS: Two fluoroscopic spot views of the low pelvis and right hip demonstrate a right total hip arthroplasty in place. The device is located. No fracture. IMPRESSION: Right total hip replacement.  No acute abnormality. Electronically Signed   By: Inge Rise M.D.   On: 08/10/2016 10:23   Dg Hip Operative Unilat W Or W/o Pelvis Right  Result Date: 08/10/2016 CLINICAL DATA:  Intraoperative imaging for right hip replacement. EXAM: OPERATIVE RIGHT HIP (WITH PELVIS IF PERFORMED) 2 VIEWS TECHNIQUE: Fluoroscopic spot image(s) were submitted for interpretation post-operatively. COMPARISON:  None. FINDINGS: Two fluoroscopic spot views of the low pelvis and right hip demonstrate a right total hip arthroplasty in place. The device is located. No fracture. IMPRESSION: Right total hip  replacement.  No acute abnormality. Electronically Signed   By: Inge Rise M.D.   On: 08/10/2016 10:23    Disposition: 01-Home or Self Care  Discharge Instructions    Call MD / Call 911    Complete by:  As directed    If you experience chest pain or shortness of breath, CALL 911 and be transported to the hospital emergency room.  If you develope a fever above 101 F, pus (white drainage) or increased drainage or redness at the wound, or calf pain, call your surgeon's office.   Constipation Prevention    Complete by:  As directed    Drink plenty of fluids.  Prune juice may be helpful.  You may use a stool softener, such as Colace (over the counter) 100 mg twice a day.  Use  MiraLax (over the counter) for constipation as needed.   Diet - low sodium heart healthy    Complete by:  As directed    Driving restrictions    Complete by:  As directed    No driving for 6 weeks   Increase activity slowly as tolerated    Complete by:  As directed    Lifting restrictions    Complete by:  As directed    No lifting for 6 weeks   TED hose    Complete by:  As directed    Use stockings (TED hose) for 2 weeks on both leg(s).  You may remove them at night for sleeping.      Follow-up Information    Marixa Mellott, Horald Pollen, MD. Schedule an appointment as soon as possible for a visit in 2 week(s).   Specialty:  Orthopedic Surgery Why:  For wound re-check Contact information: [address] [place] [address] [phone]        [facility] Follow up.   Specialty:  [place] Why:  A representative from [facility] will contact you to arrange start date and time for your therapy. Contact information: [address] [place] [address] [phone]            Signed: Elie Goody 08/11/2016, 4:21 PM

## 2016-09-23 ENCOUNTER — Other Ambulatory Visit: Payer: Self-pay | Admitting: Orthopedic Surgery

## 2016-09-23 DIAGNOSIS — Z471 Aftercare following joint replacement surgery: Secondary | ICD-10-CM

## 2016-09-23 DIAGNOSIS — Z96641 Presence of right artificial hip joint: Principal | ICD-10-CM

## 2016-09-24 ENCOUNTER — Ambulatory Visit
Admission: RE | Admit: 2016-09-24 | Discharge: 2016-09-24 | Disposition: A | Discharge status: Home / Self Care | Payer: Medicare Other | Source: Ambulatory Visit | Attending: Orthopedic Surgery | Admitting: Orthopedic Surgery

## 2016-09-24 DIAGNOSIS — Z96641 Presence of right artificial hip joint: Principal | ICD-10-CM

## 2016-09-24 DIAGNOSIS — Z471 Aftercare following joint replacement surgery: Secondary | ICD-10-CM

## 2016-09-24 IMAGING — CT CT HIP*R* W/O CM
2 of 3 series · 17 of 46 positions shown, 19 images · non-contrast
Comparison: Radiographs dated 08/10/2016

CLINICAL DATA: Right hip joint replacement surgery on 08/10/2016.
The patient fell on 09/19/2016. In

EXAM:
CT OF THE RIGHT HIP WITHOUT CONTRAST
TECHNIQUE: Multidetector CT imaging of the right hip was performed according to
the standard protocol. Multiplanar CT image reconstructions were
also generated.

[Series 3: soft tissue pelvis/hip (person_name) · axial · 0.51mm/px · z∈[+479,+719]mm · 14 of 92 slices shown, 16 images]
[im 6/92  soft-tissue]
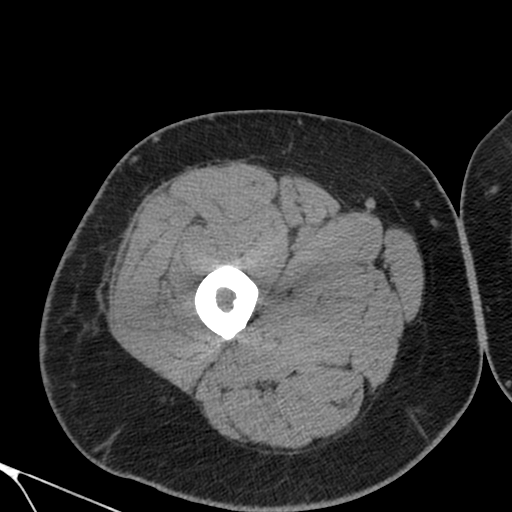
[im 6/92  bone]
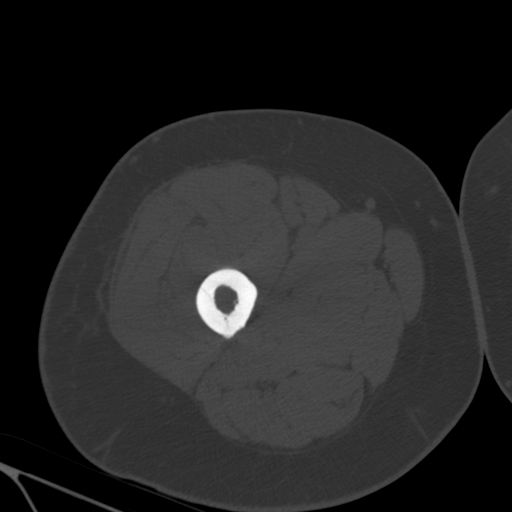
[im 12/92  soft-tissue]
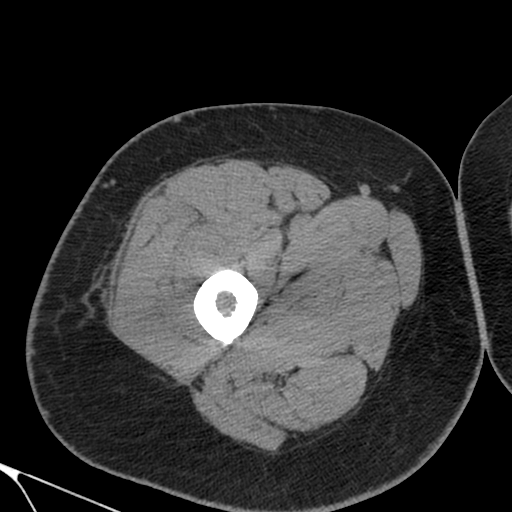
[im 18/92  soft-tissue]
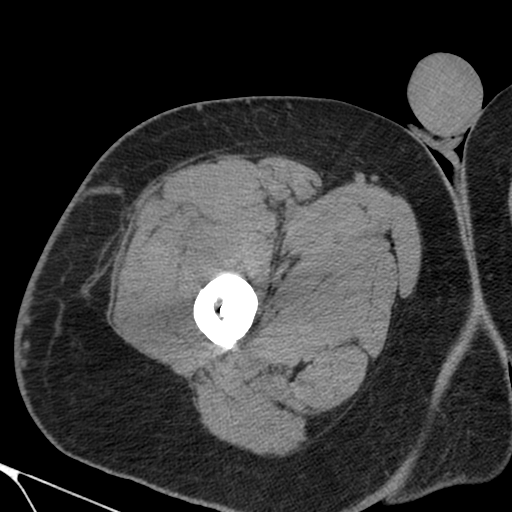
[im 24/92  soft-tissue]
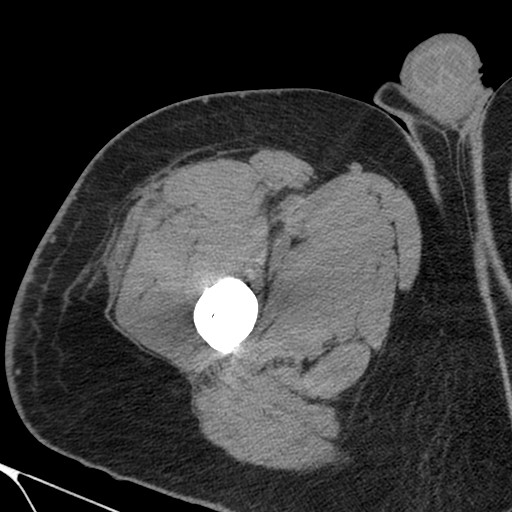
[im 30/92  soft-tissue]
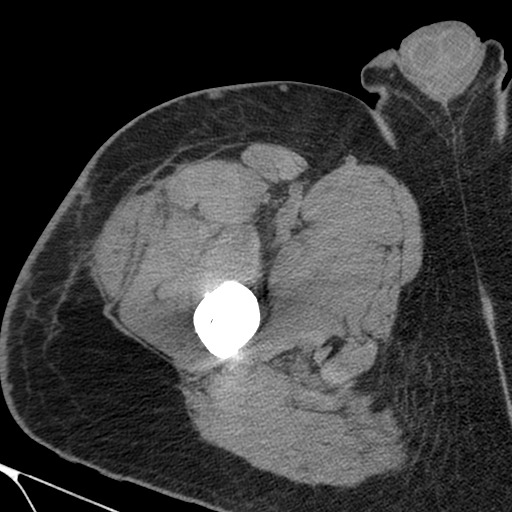
[im 36/92  soft-tissue]
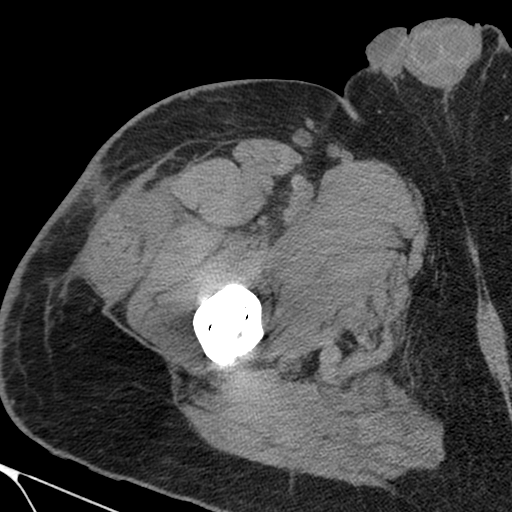
[im 42/92  soft-tissue]
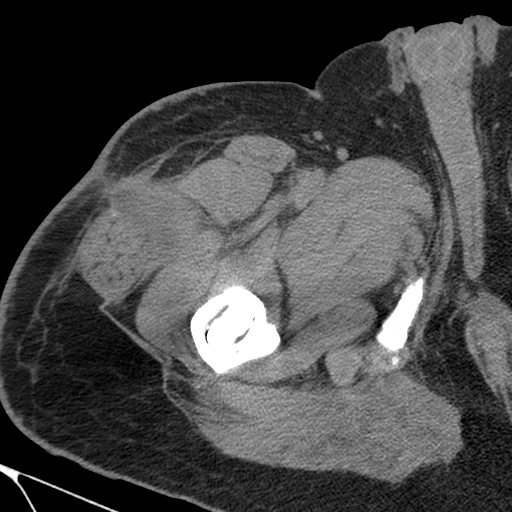
[im 50/92  soft-tissue]
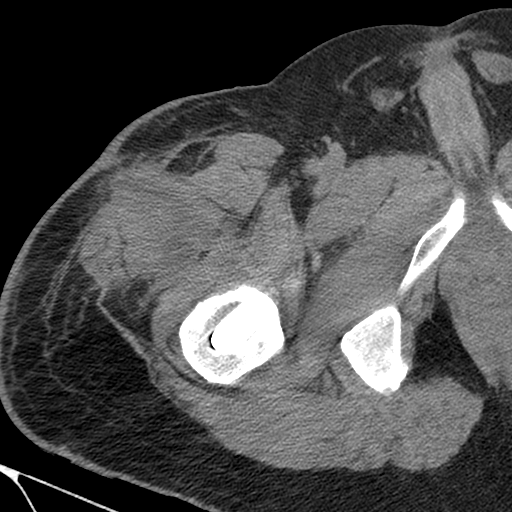
[im 56/92  soft-tissue]
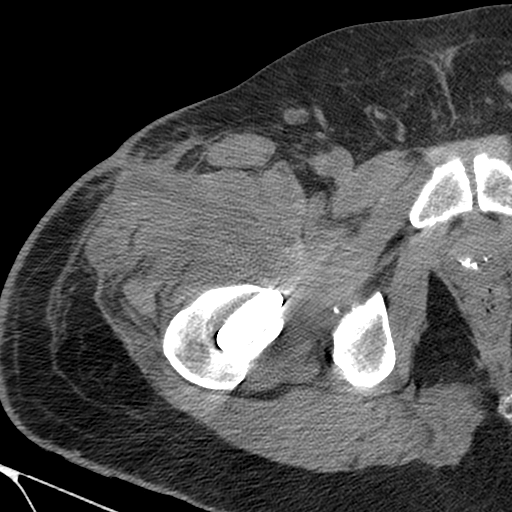
[im 56/92  bone]
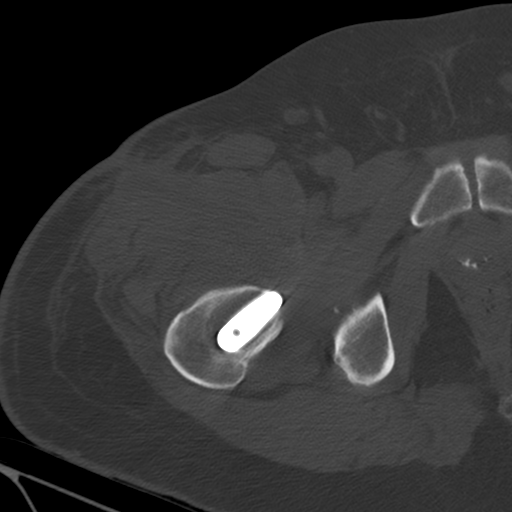
[im 62/92  soft-tissue]
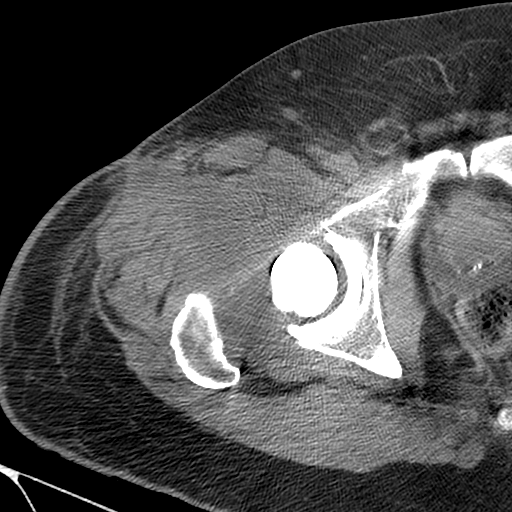
[im 68/92  soft-tissue]
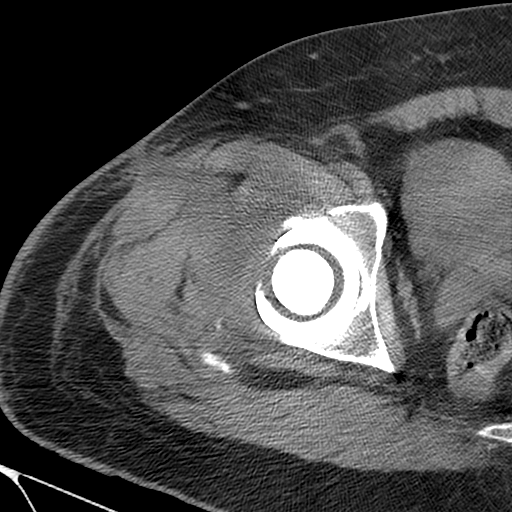
[im 74/92  soft-tissue]
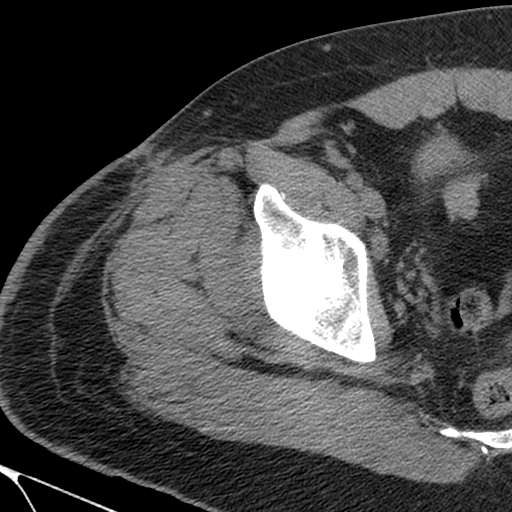
[im 80/92  soft-tissue]
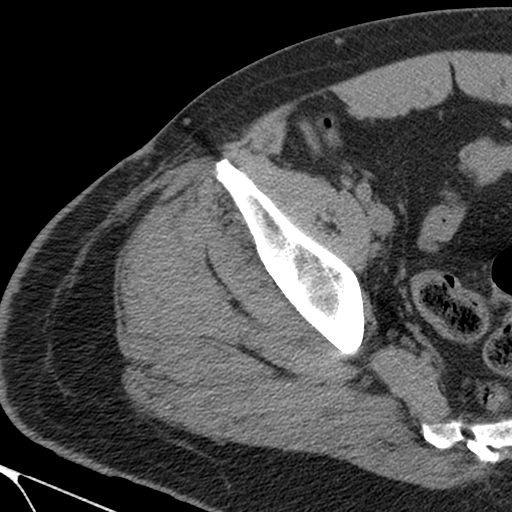
[im 86/92  soft-tissue]
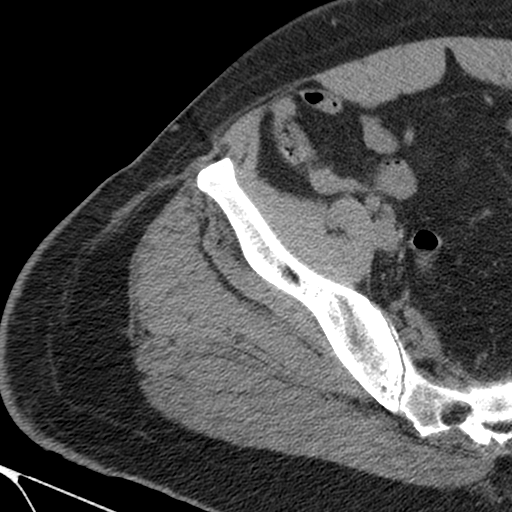

[Series 6: cor · coronal · 0.47mm/px · 3 of 75 slices shown]
[im 25/75  soft-tissue]
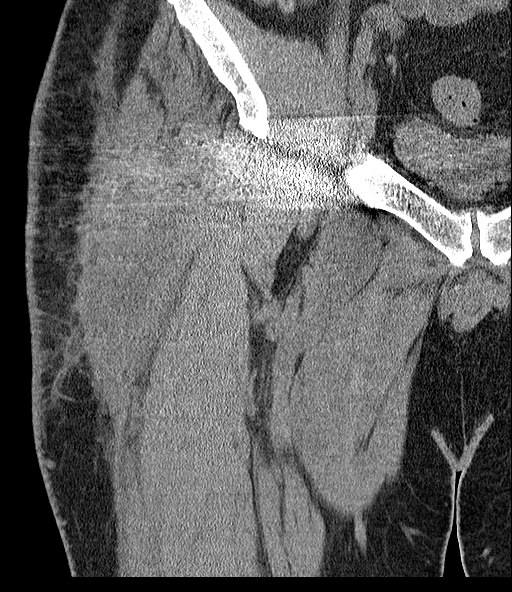
[im 33/75  soft-tissue]
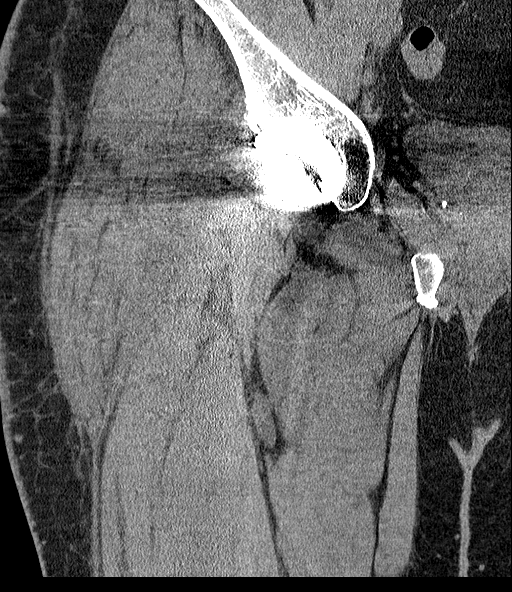
[im 42/75  soft-tissue]
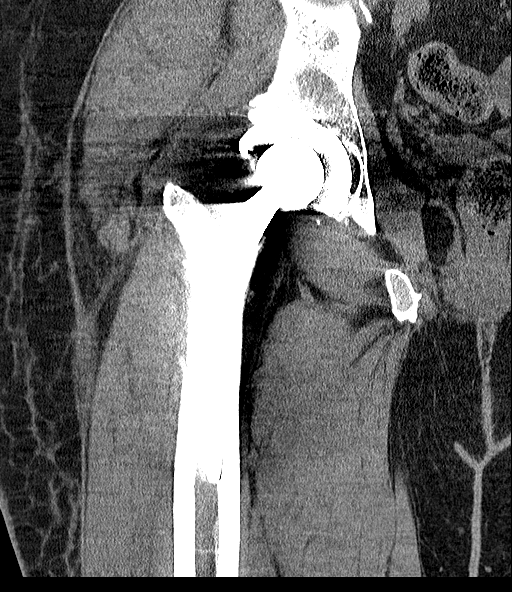

[17 of 46 positions shown; findings below may reference images not displayed]

FINDINGS: Bones/Joint/Cartilage

There is a longitudinal fracture of the proximal right femur
extending 15 cm distally from the lateral aspect of the femoral
shaft just below the right greater trochanter. There is no
displacement.

There is a subtle fracture of the anterior medial aspect of the
acetabulum best seen on images 25 through 27 of series 2.

The components of the total hip prosthesis appear in good position
with no evidence of loosening.

There is a fluid collection along the surgical track consistent with
a small hematoma.
IMPRESSION: 1. Longitudinal fracture of the proximal right femoral shaft along
the stem of the femoral component of the total hip prosthesis.
2. Small focal fracture of the right acetabulum.

## 2016-09-26 ENCOUNTER — Inpatient Hospital Stay (HOSPITAL_COMMUNITY)
Admission: RE | Admit: 2016-09-26 | Discharge: 2016-09-30 | DRG: 466 | Disposition: A | Discharge status: Rehabilitation Facility | Payer: Non-veteran care | Source: Ambulatory Visit | Attending: Orthopedic Surgery | Admitting: Orthopedic Surgery

## 2016-09-26 DIAGNOSIS — G4733 Obstructive sleep apnea (adult) (pediatric): Secondary | ICD-10-CM | POA: Diagnosis present

## 2016-09-26 DIAGNOSIS — M25551 Pain in right hip: Secondary | ICD-10-CM | POA: Diagnosis present

## 2016-09-26 DIAGNOSIS — M9701XS Periprosthetic fracture around internal prosthetic right hip joint, sequela: Secondary | ICD-10-CM | POA: Diagnosis not present

## 2016-09-26 DIAGNOSIS — T84124A Displacement of internal fixation device of right femur, initial encounter: Secondary | ICD-10-CM | POA: Diagnosis present

## 2016-09-26 DIAGNOSIS — M7989 Other specified soft tissue disorders: Secondary | ICD-10-CM | POA: Diagnosis not present

## 2016-09-26 DIAGNOSIS — S72301A Unspecified fracture of shaft of right femur, initial encounter for closed fracture: Secondary | ICD-10-CM | POA: Diagnosis present

## 2016-09-26 DIAGNOSIS — G8918 Other acute postprocedural pain: Secondary | ICD-10-CM

## 2016-09-26 DIAGNOSIS — S32591A Other specified fracture of right pubis, initial encounter for closed fracture: Secondary | ICD-10-CM | POA: Diagnosis present

## 2016-09-26 DIAGNOSIS — G8929 Other chronic pain: Secondary | ICD-10-CM | POA: Diagnosis not present

## 2016-09-26 DIAGNOSIS — Z96641 Presence of right artificial hip joint: Secondary | ICD-10-CM

## 2016-09-26 DIAGNOSIS — T889XXS Complication of surgical and medical care, unspecified, sequela: Secondary | ICD-10-CM | POA: Diagnosis not present

## 2016-09-26 DIAGNOSIS — M9701XA Periprosthetic fracture around internal prosthetic right hip joint, initial encounter: Secondary | ICD-10-CM | POA: Diagnosis present

## 2016-09-26 DIAGNOSIS — M978XXA Periprosthetic fracture around other internal prosthetic joint, initial encounter: Secondary | ICD-10-CM

## 2016-09-26 DIAGNOSIS — F17210 Nicotine dependence, cigarettes, uncomplicated: Secondary | ICD-10-CM | POA: Diagnosis present

## 2016-09-26 DIAGNOSIS — R7309 Other abnormal glucose: Secondary | ICD-10-CM | POA: Diagnosis not present

## 2016-09-26 DIAGNOSIS — Z720 Tobacco use: Secondary | ICD-10-CM

## 2016-09-26 DIAGNOSIS — F4323 Adjustment disorder with mixed anxiety and depressed mood: Secondary | ICD-10-CM

## 2016-09-26 DIAGNOSIS — Z09 Encounter for follow-up examination after completed treatment for conditions other than malignant neoplasm: Secondary | ICD-10-CM

## 2016-09-26 DIAGNOSIS — N400 Enlarged prostate without lower urinary tract symptoms: Secondary | ICD-10-CM | POA: Diagnosis not present

## 2016-09-26 DIAGNOSIS — R739 Hyperglycemia, unspecified: Secondary | ICD-10-CM | POA: Diagnosis not present

## 2016-09-26 DIAGNOSIS — M9701XD Periprosthetic fracture around internal prosthetic right hip joint, subsequent encounter: Secondary | ICD-10-CM | POA: Diagnosis not present

## 2016-09-26 DIAGNOSIS — K5903 Drug induced constipation: Secondary | ICD-10-CM | POA: Diagnosis not present

## 2016-09-26 DIAGNOSIS — M9702XS Periprosthetic fracture around internal prosthetic left hip joint, sequela: Secondary | ICD-10-CM | POA: Diagnosis not present

## 2016-09-26 DIAGNOSIS — N183 Chronic kidney disease, stage 3 unspecified: Secondary | ICD-10-CM

## 2016-09-26 DIAGNOSIS — W19XXXA Unspecified fall, initial encounter: Secondary | ICD-10-CM | POA: Diagnosis present

## 2016-09-26 DIAGNOSIS — K5909 Other constipation: Secondary | ICD-10-CM | POA: Diagnosis not present

## 2016-09-26 DIAGNOSIS — I10 Essential (primary) hypertension: Secondary | ICD-10-CM | POA: Diagnosis not present

## 2016-09-26 DIAGNOSIS — D62 Acute posthemorrhagic anemia: Secondary | ICD-10-CM | POA: Diagnosis not present

## 2016-09-26 DIAGNOSIS — Z419 Encounter for procedure for purposes other than remedying health state, unspecified: Secondary | ICD-10-CM

## 2016-09-26 DIAGNOSIS — Z96649 Presence of unspecified artificial hip joint: Secondary | ICD-10-CM

## 2016-09-26 DIAGNOSIS — Z91030 Bee allergy status: Secondary | ICD-10-CM | POA: Diagnosis not present

## 2016-09-26 DIAGNOSIS — E46 Unspecified protein-calorie malnutrition: Secondary | ICD-10-CM | POA: Diagnosis not present

## 2016-09-26 DIAGNOSIS — J449 Chronic obstructive pulmonary disease, unspecified: Secondary | ICD-10-CM | POA: Diagnosis present

## 2016-09-26 DIAGNOSIS — M545 Low back pain: Secondary | ICD-10-CM | POA: Diagnosis not present

## 2016-09-26 HISTORY — PX: ORIF FEMUR FRACTURE: SHX2119

## 2016-09-26 LAB — BASIC METABOLIC PANEL
ANION GAP: 6 (ref 5–15)
BUN: 14 mg/dL (ref 6–20)
CALCIUM: 9.1 mg/dL (ref 8.9–10.3)
CO2: 30 mmol/L (ref 22–32)
Chloride: 100 mmol/L — ABNORMAL LOW (ref 101–111)
Creatinine, Ser: 1.16 mg/dL (ref 0.61–1.24)
GFR calc Af Amer: >60 mL/min (ref >60)
GFR calc non Af Amer: >60 mL/min (ref >60)
GLUCOSE: 118 mg/dL — AB (ref 65–99)
Potassium: 4.1 mmol/L (ref 3.5–5.1)
Sodium: 136 mmol/L (ref 135–145)

## 2016-09-26 LAB — SURGICAL PCR SCREEN
MRSA, PCR: NEGATIVE
Staphylococcus aureus: NEGATIVE

## 2016-09-26 LAB — CBC
HEMATOCRIT: 41 % (ref 39.0–52.0)
Hemoglobin: 12.8 g/dL — ABNORMAL LOW (ref 13.0–17.0)
MCH: 30.9 pg (ref 26.0–34.0)
MCHC: 31.2 g/dL (ref 30.0–36.0)
MCV: 99 fL (ref 78.0–100.0)
Platelets: 297 10*3/uL (ref 150–400)
RBC: 4.14 MIL/uL — ABNORMAL LOW (ref 4.22–5.81)
RDW: 13.2 % (ref 11.5–15.5)
WBC: 6.1 10*3/uL (ref 4.0–10.5)

## 2016-09-26 LAB — PROTIME-INR
INR: 1.01
Prothrombin Time: 13.3 seconds (ref 11.4–15.2)

## 2016-09-26 MED ORDER — CHLORHEXIDINE GLUCONATE 4 % EX LIQD
60.0000 mL | Freq: Once | CUTANEOUS | Status: AC
  Administered 2016-09-26: 4 via TOPICAL
  Filled 2016-09-26: qty 60

## 2016-09-26 MED ORDER — SODIUM CHLORIDE 0.9 % IV SOLN: INTRAVENOUS | Status: DC

## 2016-09-26 MED ORDER — POVIDONE-IODINE 10 % EX SWAB
2.0000 "application " | Freq: Once | CUTANEOUS | Status: AC
  Administered 2016-09-27: 2 via TOPICAL

## 2016-09-26 MED ORDER — MORPHINE SULFATE (PF) 4 MG/ML IV SOLN: 2.0000 mg | INTRAVENOUS | Status: DC | PRN

## 2016-09-26 MED ORDER — TRANEXAMIC ACID 1000 MG/10ML IV SOLN
1000.0000 mg | INTRAVENOUS | Status: AC
  Administered 2016-09-27: 1000 mg via INTRAVENOUS
  Filled 2016-09-26: qty 10

## 2016-09-26 MED ORDER — CHLORHEXIDINE GLUCONATE 4 % EX LIQD: 60.0000 mL | Freq: Once | CUTANEOUS | Status: AC

## 2016-09-26 MED ORDER — HYDROCODONE-ACETAMINOPHEN 5-325 MG PO TABS
1.0000 | ORAL_TABLET | Freq: Four times a day (QID) | ORAL | Status: DC | PRN
  Administered 2016-09-26 – 2016-09-30 (×6): 2 via ORAL
  Filled 2016-09-26 (×6): qty 2

## 2016-09-26 MED ORDER — CEFAZOLIN SODIUM-DEXTROSE 2-4 GM/100ML-% IV SOLN: 2.0000 g | INTRAVENOUS | Status: DC

## 2016-09-26 MED ORDER — SENNOSIDES-DOCUSATE SODIUM 8.6-50 MG PO TABS: 1.0000 | ORAL_TABLET | Freq: Every evening | ORAL | Status: DC | PRN

## 2016-09-26 MED ORDER — METHOCARBAMOL 1000 MG/10ML IJ SOLN
500.0000 mg | Freq: Four times a day (QID) | INTRAVENOUS | Status: DC | PRN
  Filled 2016-09-26: qty 5

## 2016-09-26 MED ORDER — METHOCARBAMOL 500 MG PO TABS
500.0000 mg | ORAL_TABLET | Freq: Four times a day (QID) | ORAL | Status: DC | PRN
  Administered 2016-09-26: 500 mg via ORAL
  Filled 2016-09-26 (×2): qty 1

## 2016-09-26 MED ORDER — SODIUM CHLORIDE 0.9 % IV SOLN
INTRAVENOUS | Status: DC
  Administered 2016-09-26: 22:00:00 via INTRAVENOUS

## 2016-09-26 NOTE — Progress Notes (Signed)
Pt wears CPAP at home, brought mask, needed machine. Pt set up on CPAP 9cmH20. Pt states setting is comfortable, and will place mask on himself when ready for bed. RT will continue to monitor.

## 2016-09-27 ENCOUNTER — Inpatient Hospital Stay (HOSPITAL_COMMUNITY): Payer: Non-veteran care | Admitting: Certified Registered Nurse Anesthetist

## 2016-09-27 ENCOUNTER — Inpatient Hospital Stay (HOSPITAL_COMMUNITY): Payer: Non-veteran care

## 2016-09-27 ENCOUNTER — Encounter (HOSPITAL_COMMUNITY)
Admission: RE | Disposition: A | Discharge status: Rehabilitation Facility | Payer: Self-pay | Source: Ambulatory Visit | Attending: Orthopedic Surgery

## 2016-09-27 DIAGNOSIS — M978XXA Periprosthetic fracture around other internal prosthetic joint, initial encounter: Secondary | ICD-10-CM

## 2016-09-27 DIAGNOSIS — Z96649 Presence of unspecified artificial hip joint: Secondary | ICD-10-CM

## 2016-09-27 HISTORY — PX: ORIF PERIPROSTHETIC FRACTURE: SHX5034

## 2016-09-27 HISTORY — PX: TOTAL HIP ARTHROPLASTY: SHX124

## 2016-09-27 LAB — POCT I-STAT 4, (NA,K, GLUC, HGB,HCT)
GLUCOSE: 114 mg/dL — AB (ref 65–99)
Glucose, Bld: 115 mg/dL — ABNORMAL HIGH (ref 65–99)
HCT: 34 % — ABNORMAL LOW (ref 39.0–52.0)
HEMATOCRIT: 32 % — AB (ref 39.0–52.0)
HEMOGLOBIN: 11.6 g/dL — AB (ref 13.0–17.0)
Hemoglobin: 10.9 g/dL — ABNORMAL LOW (ref 13.0–17.0)
Potassium: 4.1 mmol/L (ref 3.5–5.1)
Potassium: 4.1 mmol/L (ref 3.5–5.1)
Sodium: 140 mmol/L (ref 135–145)
Sodium: 141 mmol/L (ref 135–145)

## 2016-09-27 LAB — HIV ANTIBODY (ROUTINE TESTING W REFLEX): HIV SCREEN 4TH GENERATION: NONREACTIVE

## 2016-09-27 IMAGING — DX DG FEMUR 2+V PORT*R*
4 series · 4 of 4 positions shown · non-contrast
Comparison: Multiple priors.

CLINICAL DATA: Periprosthetic femur fracture. Status post revision.

EXAM:
RIGHT FEMUR PORTABLE 2 VIEW

[femur ap (1 of 2)]
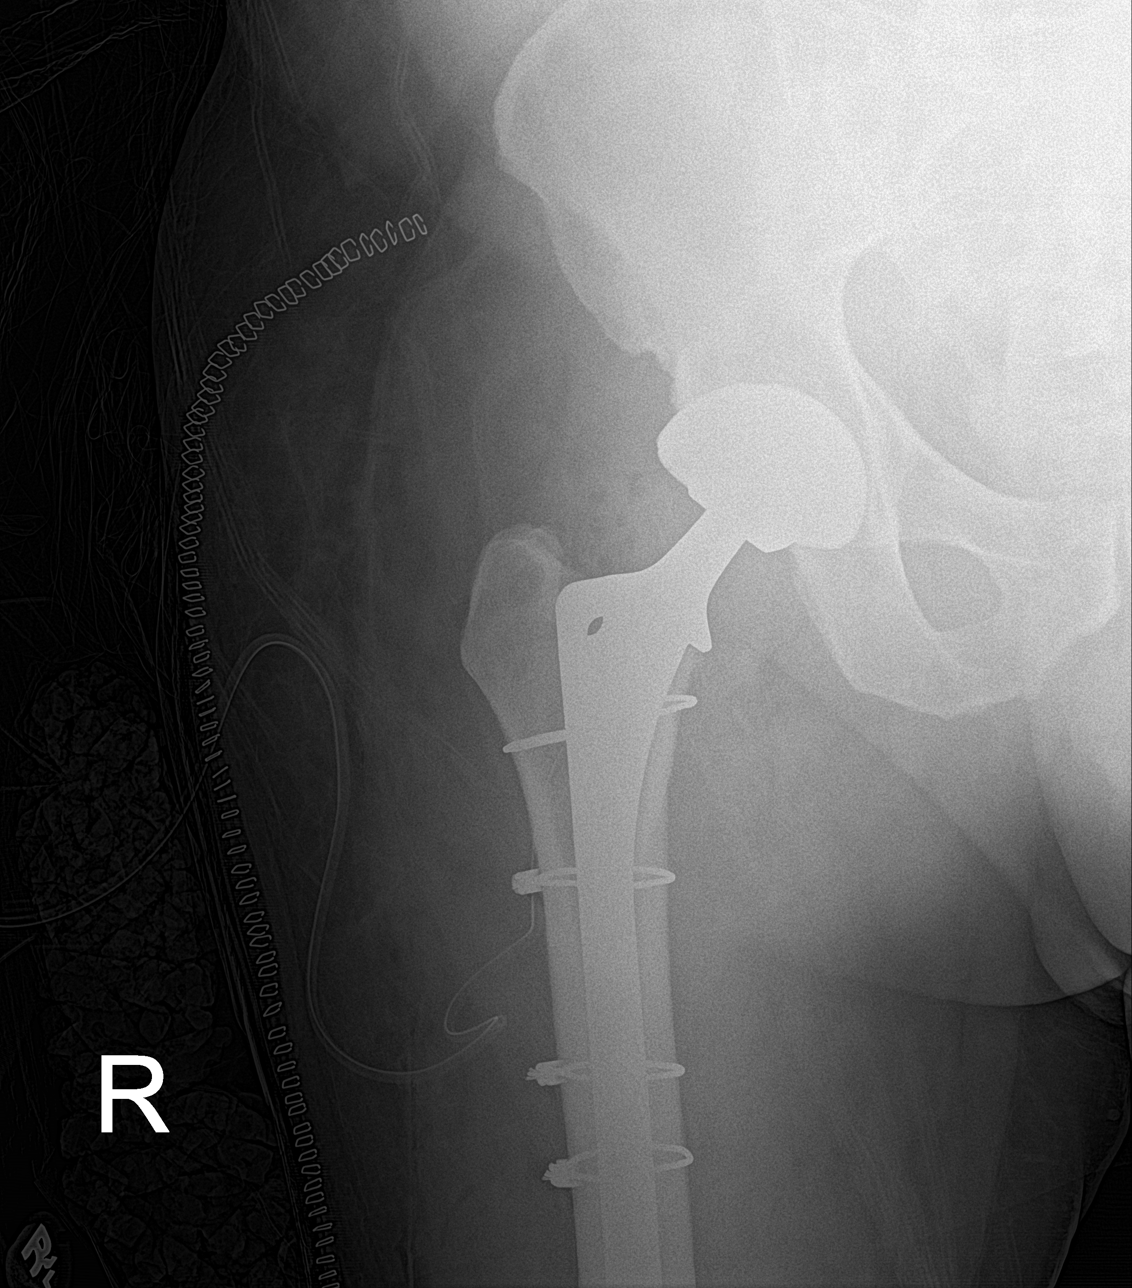

[femur ap (2 of 2)]
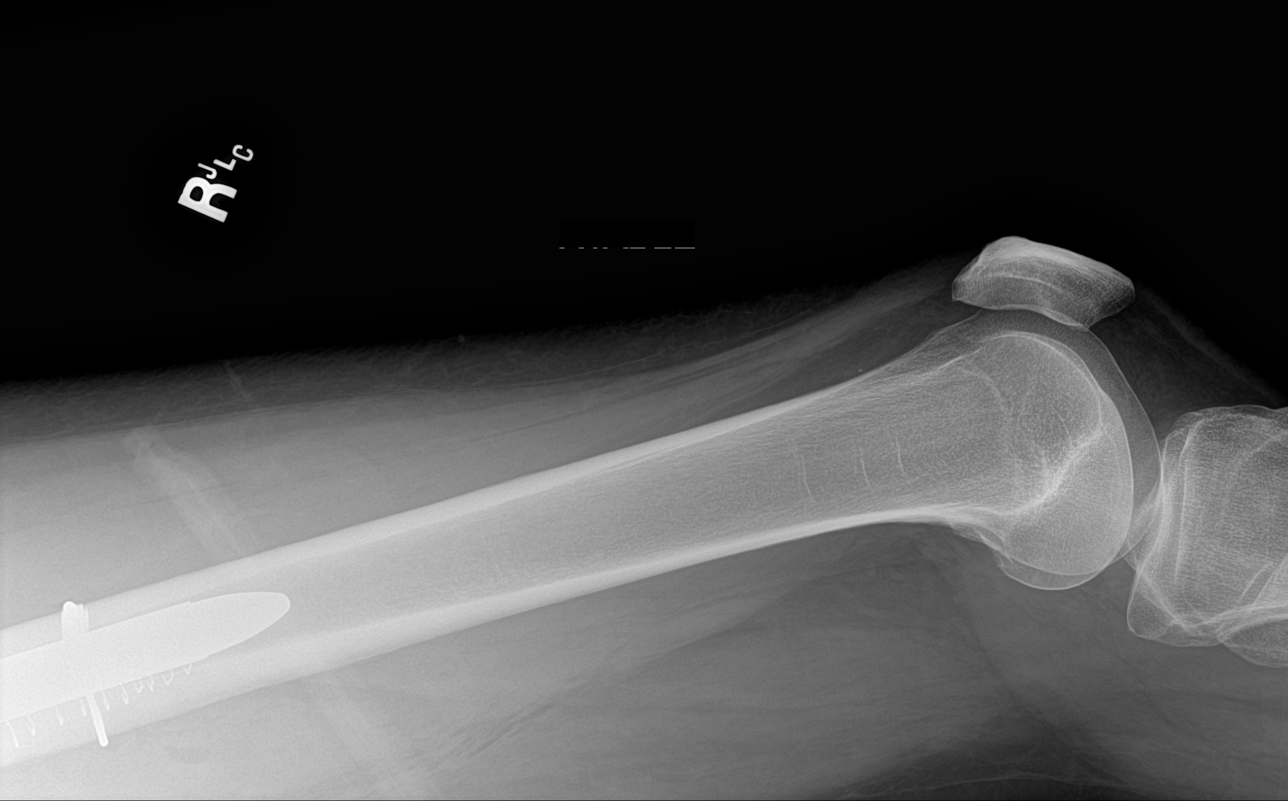

[femur lat (1 of 2)]
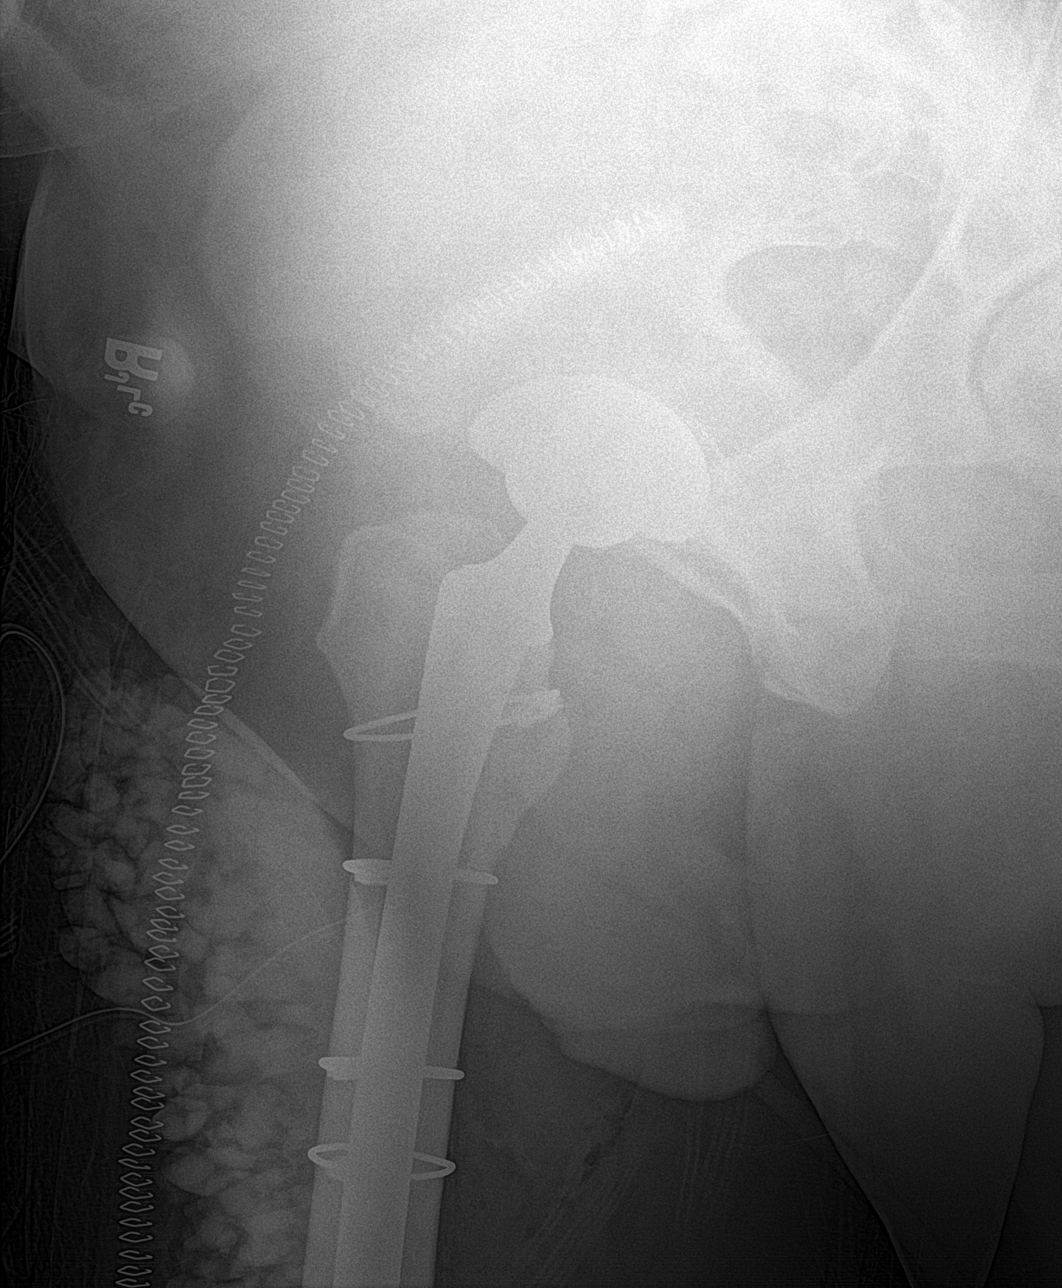

[femur lat (2 of 2)]
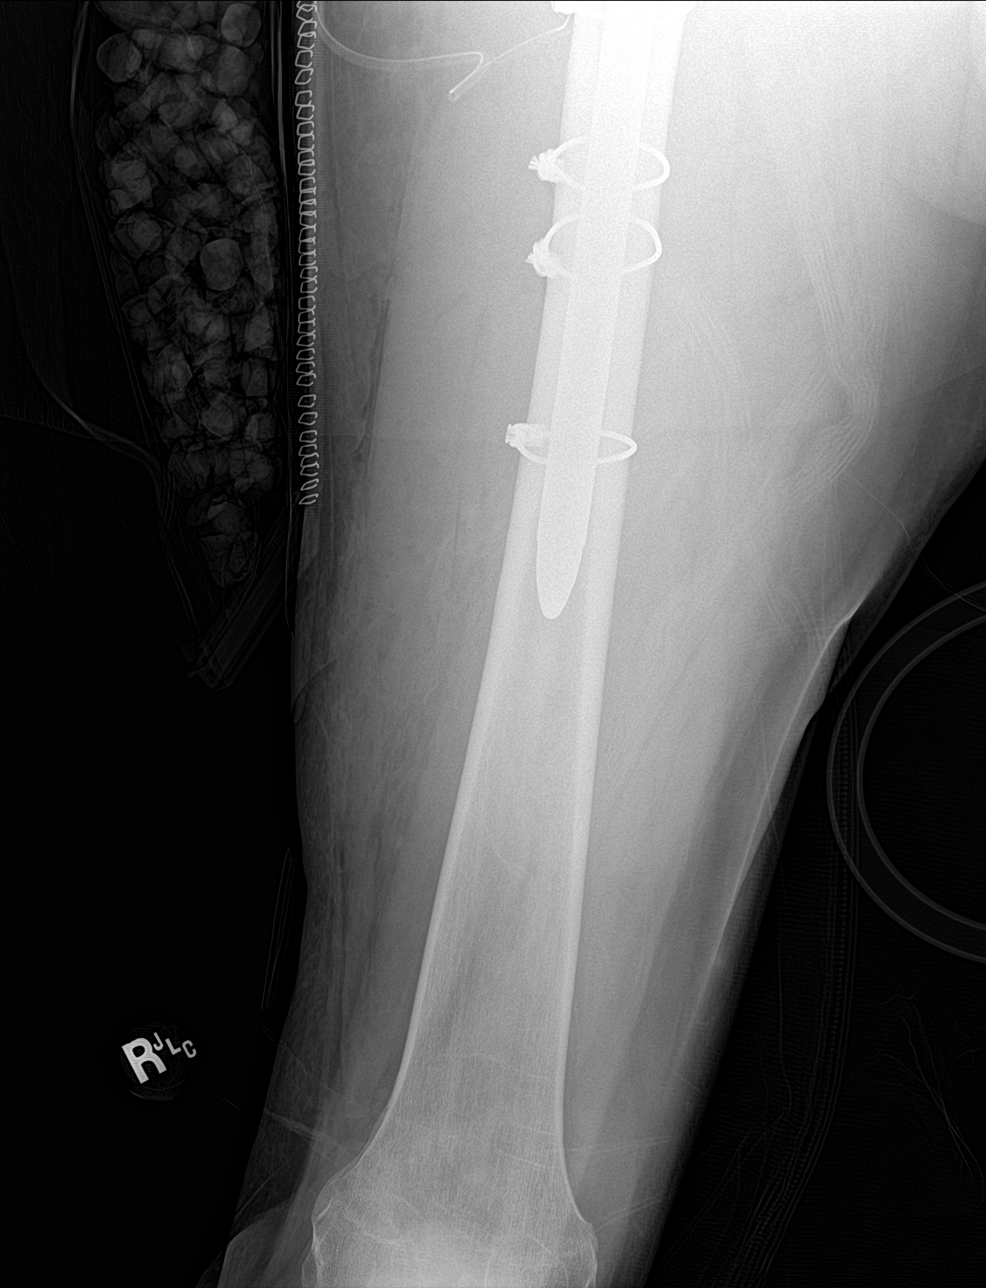

[4 of 4 positions shown; findings below may reference images not displayed]

FINDINGS: Status post revision of RIGHT total hip replacement, augmented with
cable fixation. Satisfactory position and alignment. Surgical drain
good position.
IMPRESSION: As above.

## 2016-09-27 IMAGING — RF DG C-ARM 61-120 MIN
1 series · 3 of 3 positions shown · non-contrast
Comparison: 08/10/2016

CLINICAL DATA: Right periprosthetic femur fracture.

EXAM:
DG C-ARM 61-120 MIN; OPERATIVE RIGHT HIP WITH PELVIS

[Series 1: run · 3 of 3 slices shown]
[im 1/3]
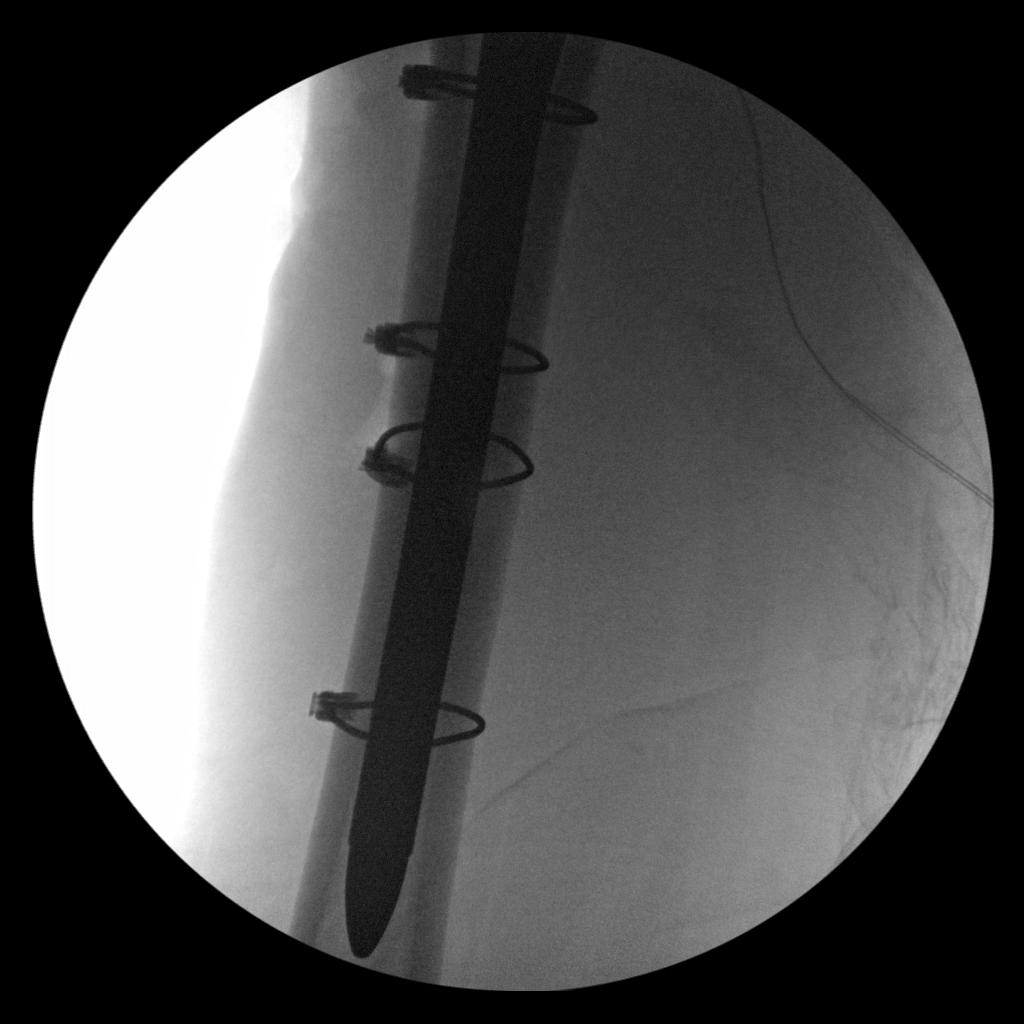
[im 2/3]
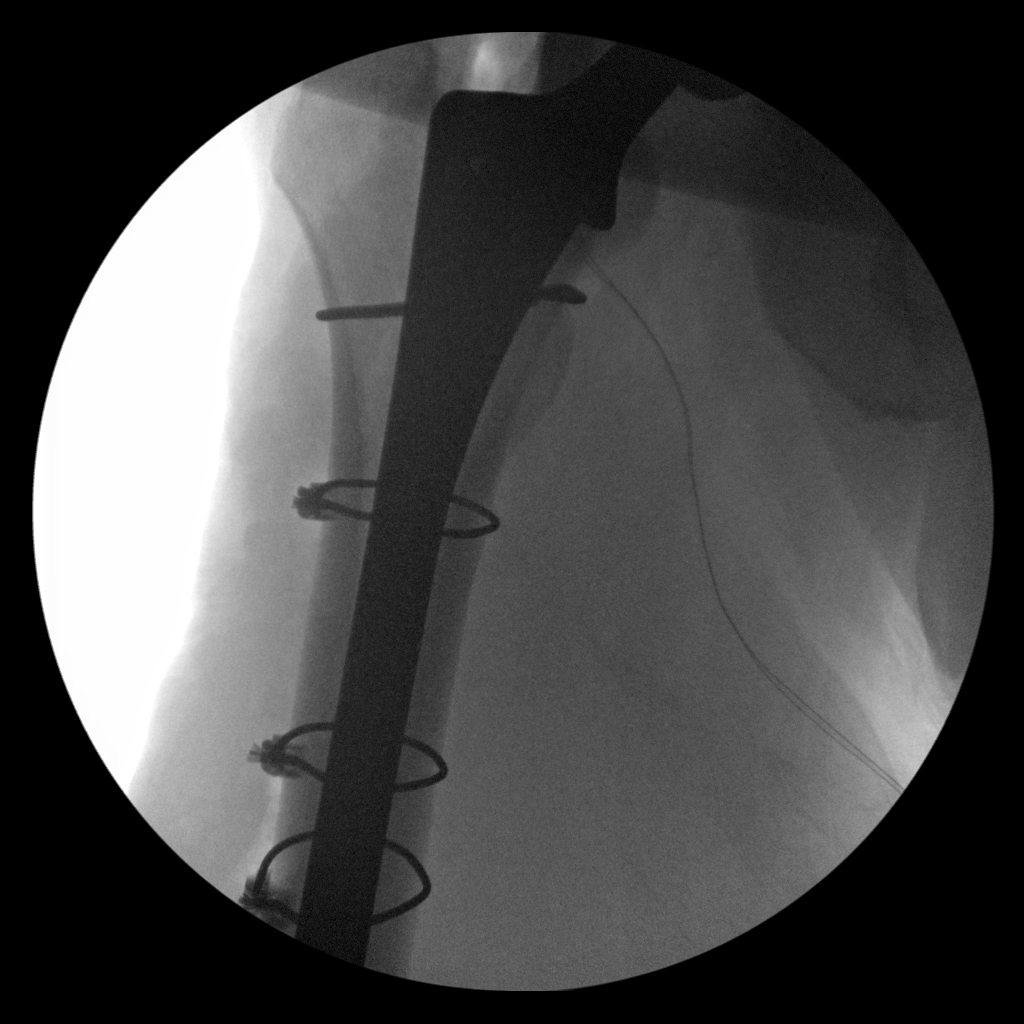
[im 3/3]
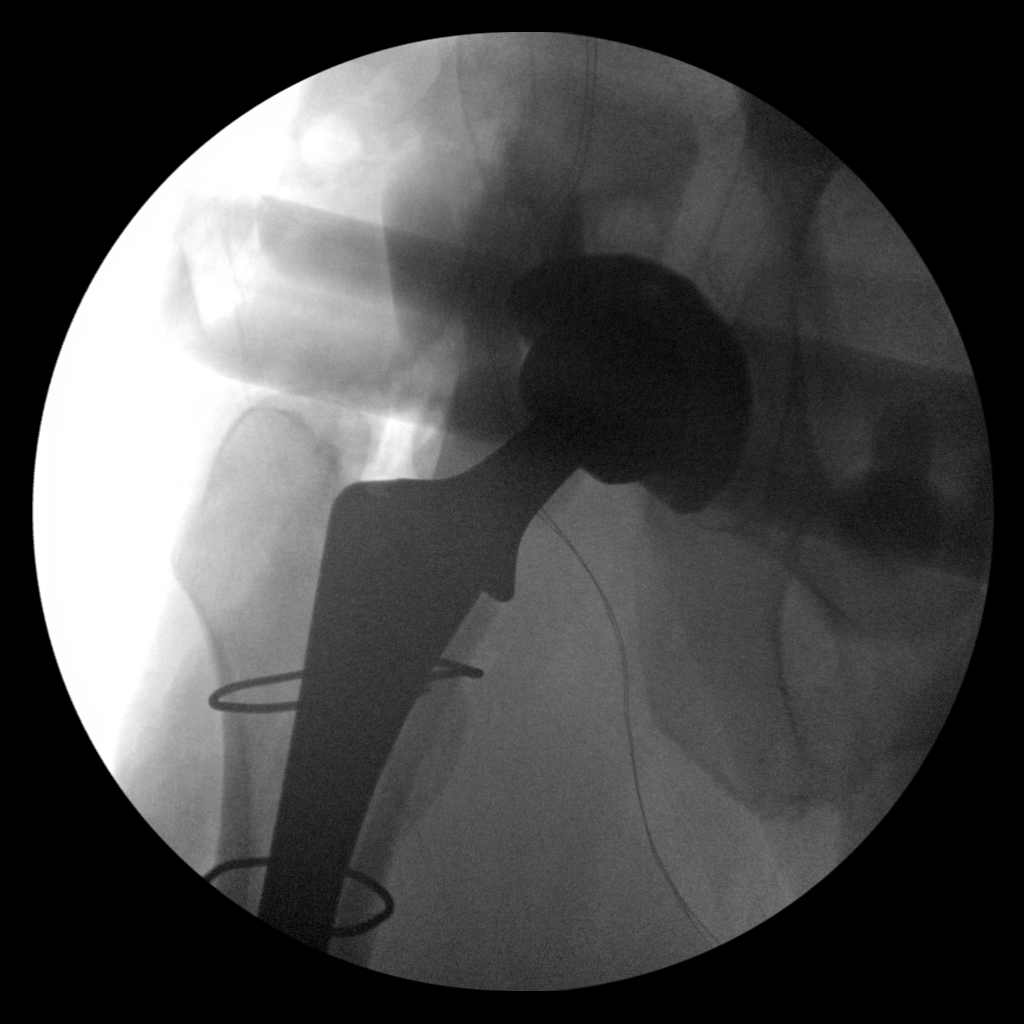

[3 of 3 positions shown; findings below may reference images not displayed]

FINDINGS: Status post revision arthroplasty of the right hip with placement of
a long stem femoral component with retention cerclage wires.
Fracture fragments are in anatomic alignment.
IMPRESSION: 1. Status post right hip revision arthroplasty for periprosthetic
fracture.

## 2016-09-27 IMAGING — DX DG PORTABLE PELVIS
2 series · 2 of 2 positions shown · non-contrast
Comparison: Intraoperative fluoroscopic spot films from earlier
same day and CT right hip dated 09/24/2016

CLINICAL DATA: Postop check

EXAM:
PORTABLE PELVIS 1-2 VIEWS

[pelvis ap (1 of 2)]
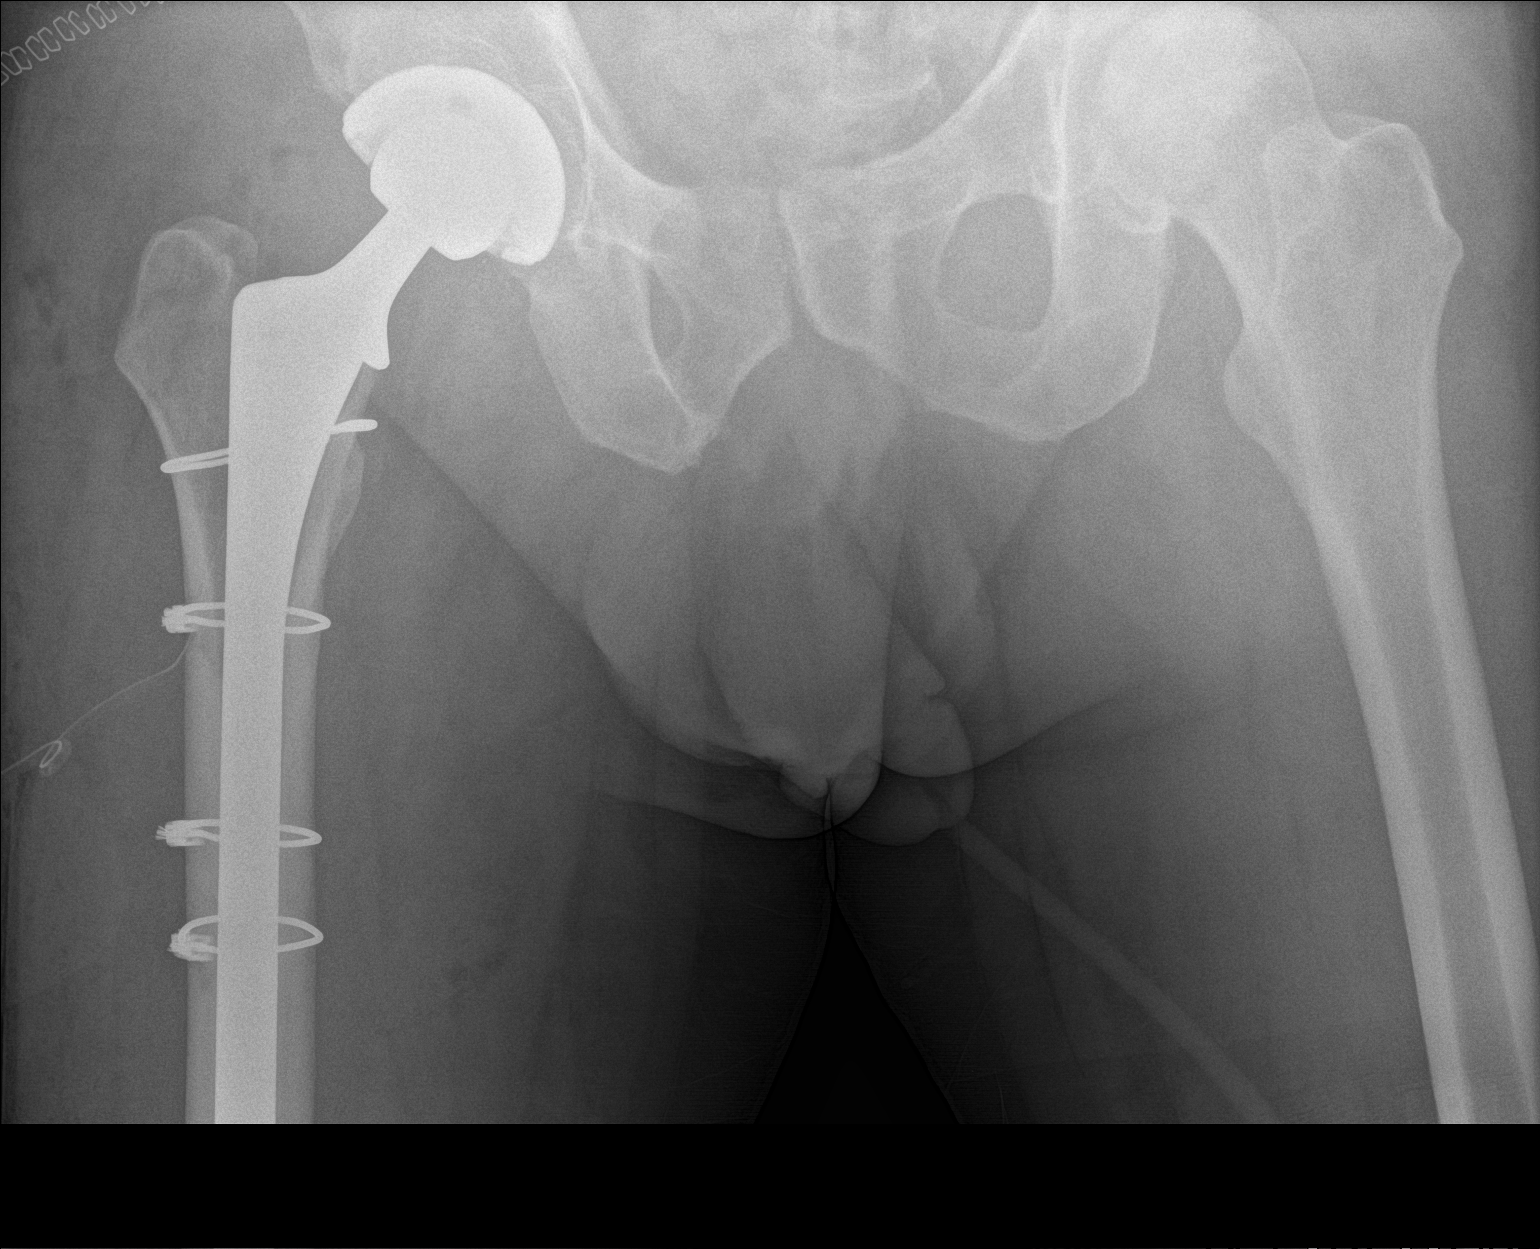

[pelvis ap (2 of 2)]
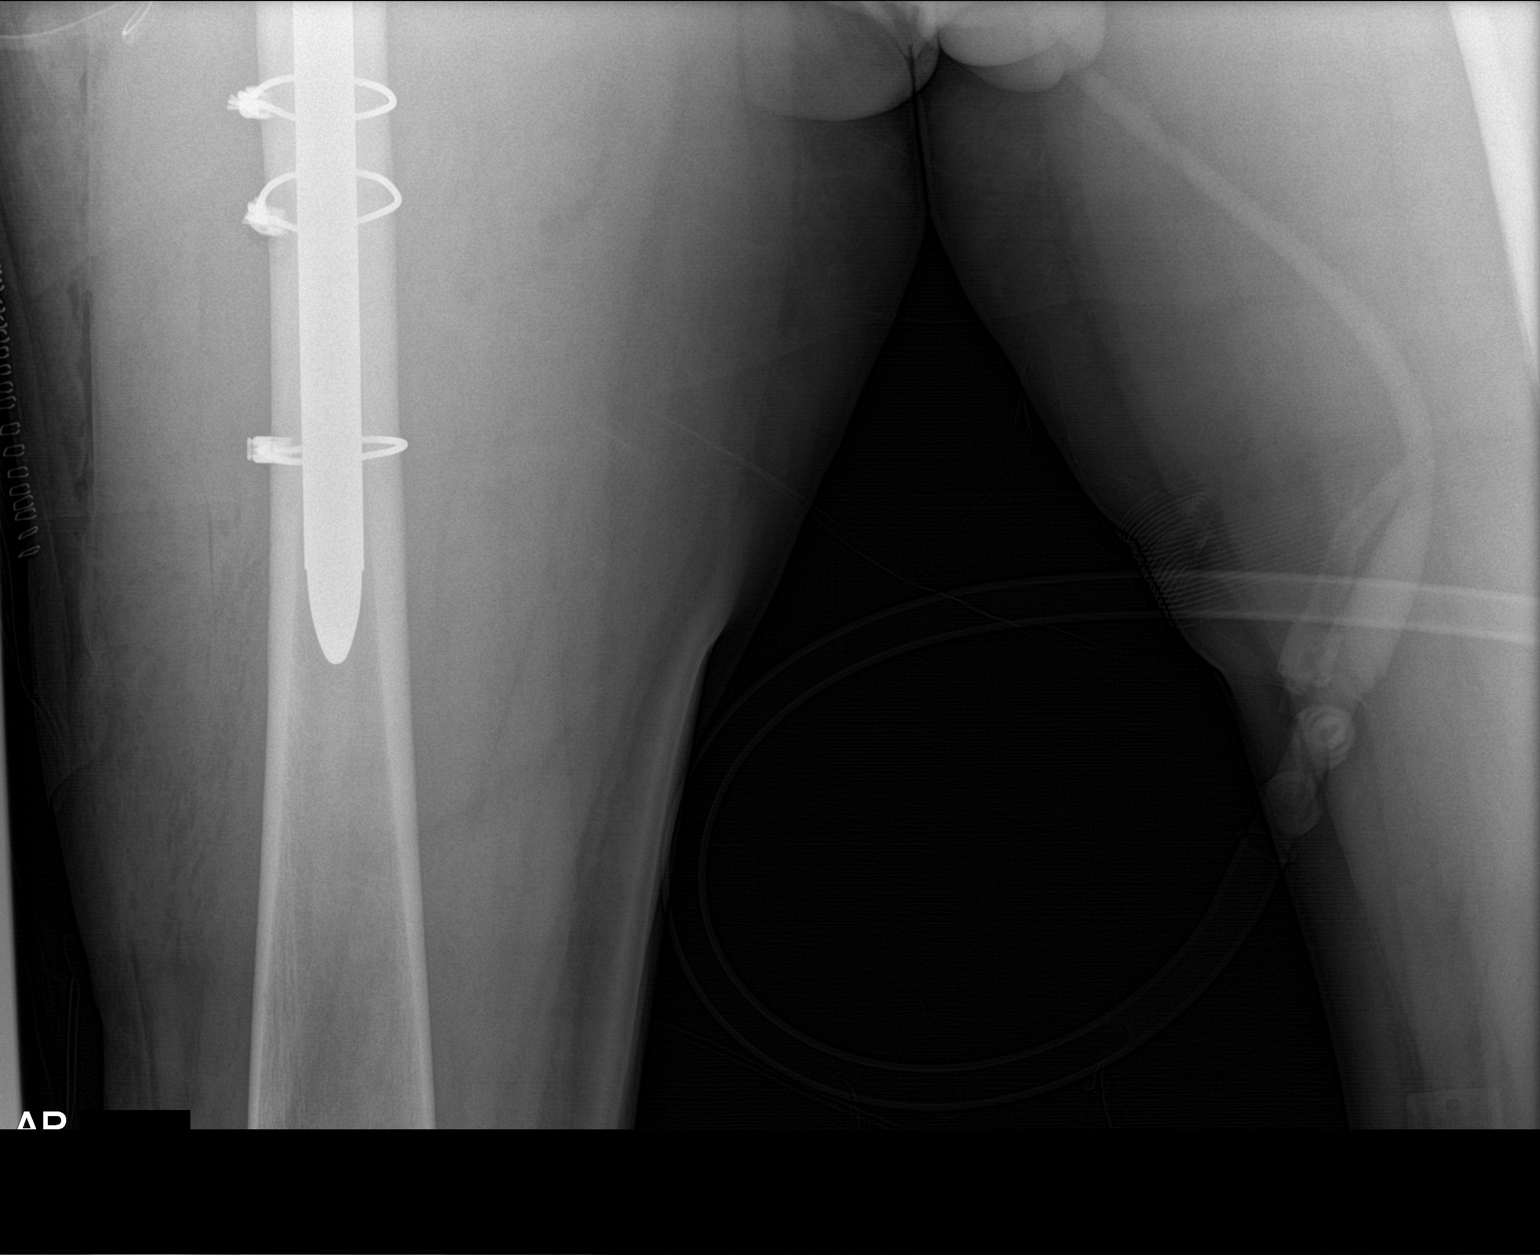

[2 of 2 positions shown; findings below may reference images not displayed]

FINDINGS: Status post revision arthroplasty of the right hip with placement of
a long stem femoral component and overlying cerclage wires. Hardware
appears intact and appropriately positioned. Surrounding osseous
structures appear intact and anatomic in alignment.
IMPRESSION: Status post revision arthroplasty of the right hip. Hardware appears
intact and appropriately positioned. No evidence of surgical
complicating feature.

## 2016-09-27 SURGERY — OPEN REDUCTION INTERNAL FIXATION (ORIF) PERIPROSTHETIC FRACTURE
Anesthesia: General | Site: Hip | Laterality: Right

## 2016-09-27 MED ORDER — 0.9 % SODIUM CHLORIDE (POUR BTL) OPTIME
TOPICAL | Status: DC | PRN
  Administered 2016-09-27: 1000 mL

## 2016-09-27 MED ORDER — TAMSULOSIN HCL 0.4 MG PO CAPS
0.4000 mg | ORAL_CAPSULE | Freq: Every day | ORAL | Status: DC
  Administered 2016-09-27 – 2016-09-29 (×3): 0.4 mg via ORAL
  Filled 2016-09-27 (×3): qty 1

## 2016-09-27 MED ORDER — METOCLOPRAMIDE HCL 5 MG PO TABS: 5.0000 mg | ORAL_TABLET | Freq: Three times a day (TID) | ORAL | Status: DC | PRN

## 2016-09-27 MED ORDER — ADULT MULTIVITAMIN W/MINERALS CH
1.0000 | ORAL_TABLET | Freq: Every day | ORAL | Status: DC
  Administered 2016-09-28 – 2016-09-30 (×3): 1 via ORAL
  Filled 2016-09-27 (×3): qty 1

## 2016-09-27 MED ORDER — SIMVASTATIN 20 MG PO TABS
20.0000 mg | ORAL_TABLET | Freq: Every day | ORAL | Status: DC
  Administered 2016-09-27 – 2016-09-29 (×3): 20 mg via ORAL
  Filled 2016-09-27 (×3): qty 1

## 2016-09-27 MED ORDER — ACETAMINOPHEN 650 MG RE SUPP: 650.0000 mg | Freq: Four times a day (QID) | RECTAL | Status: DC | PRN

## 2016-09-27 MED ORDER — ONDANSETRON HCL 4 MG/2ML IJ SOLN: 4.0000 mg | Freq: Four times a day (QID) | INTRAMUSCULAR | Status: DC | PRN

## 2016-09-27 MED ORDER — FLUOXETINE HCL 20 MG PO CAPS
20.0000 mg | ORAL_CAPSULE | Freq: Every day | ORAL | Status: DC
  Administered 2016-09-28 – 2016-09-30 (×3): 20 mg via ORAL
  Filled 2016-09-27 (×3): qty 1

## 2016-09-27 MED ORDER — METOCLOPRAMIDE HCL 5 MG/ML IJ SOLN: 5.0000 mg | Freq: Three times a day (TID) | INTRAMUSCULAR | Status: DC | PRN

## 2016-09-27 MED ORDER — CEFAZOLIN SODIUM 1 G IJ SOLR
INTRAMUSCULAR | Status: AC
  Filled 2016-09-27: qty 20

## 2016-09-27 MED ORDER — PROPOFOL 10 MG/ML IV BOLUS
INTRAVENOUS | Status: AC
  Filled 2016-09-27: qty 20

## 2016-09-27 MED ORDER — SUGAMMADEX SODIUM 500 MG/5ML IV SOLN
INTRAVENOUS | Status: DC | PRN
  Administered 2016-09-27: 250 mg via INTRAVENOUS

## 2016-09-27 MED ORDER — DEXAMETHASONE SODIUM PHOSPHATE 10 MG/ML IJ SOLN
INTRAMUSCULAR | Status: DC | PRN
  Administered 2016-09-27: 10 mg via INTRAVENOUS

## 2016-09-27 MED ORDER — OXYCODONE HCL 5 MG PO TABS
10.0000 mg | ORAL_TABLET | ORAL | Status: DC | PRN
  Administered 2016-09-27: 10 mg via ORAL
  Administered 2016-09-28 (×4): 15 mg via ORAL
  Administered 2016-09-29: 10 mg via ORAL
  Administered 2016-09-29 – 2016-09-30 (×5): 15 mg via ORAL
  Filled 2016-09-27: qty 3
  Filled 2016-09-27: qty 2
  Filled 2016-09-27 (×2): qty 3
  Filled 2016-09-27: qty 2
  Filled 2016-09-27 (×7): qty 3

## 2016-09-27 MED ORDER — PHENOL 1.4 % MT LIQD: 1.0000 | OROMUCOSAL | Status: DC | PRN

## 2016-09-27 MED ORDER — FENTANYL CITRATE (PF) 250 MCG/5ML IJ SOLN
INTRAMUSCULAR | Status: AC
  Filled 2016-09-27: qty 5

## 2016-09-27 MED ORDER — PROPOFOL 10 MG/ML IV BOLUS
INTRAVENOUS | Status: DC | PRN
  Administered 2016-09-27 (×2): 20 mg via INTRAVENOUS
  Administered 2016-09-27: 150 mg via INTRAVENOUS

## 2016-09-27 MED ORDER — ROCURONIUM BROMIDE 100 MG/10ML IV SOLN
INTRAVENOUS | Status: DC | PRN
  Administered 2016-09-27: 50 mg via INTRAVENOUS
  Administered 2016-09-27: 20 mg via INTRAVENOUS
  Administered 2016-09-27: 10 mg via INTRAVENOUS
  Administered 2016-09-27: 20 mg via INTRAVENOUS

## 2016-09-27 MED ORDER — ALBUTEROL SULFATE HFA 108 (90 BASE) MCG/ACT IN AERS: 2.0000 | INHALATION_SPRAY | Freq: Four times a day (QID) | RESPIRATORY_TRACT | Status: DC | PRN

## 2016-09-27 MED ORDER — SODIUM CHLORIDE 0.9 % IV SOLN: INTRAVENOUS | Status: DC

## 2016-09-27 MED ORDER — ALBUMIN HUMAN 5 % IV SOLN
INTRAVENOUS | Status: DC | PRN
  Administered 2016-09-27 (×2): via INTRAVENOUS

## 2016-09-27 MED ORDER — ALBUTEROL SULFATE (2.5 MG/3ML) 0.083% IN NEBU: 2.5000 mg | INHALATION_SOLUTION | Freq: Four times a day (QID) | RESPIRATORY_TRACT | Status: DC | PRN

## 2016-09-27 MED ORDER — SUGAMMADEX SODIUM 500 MG/5ML IV SOLN
INTRAVENOUS | Status: AC
  Filled 2016-09-27: qty 5

## 2016-09-27 MED ORDER — MIDAZOLAM HCL 2 MG/2ML IJ SOLN
INTRAMUSCULAR | Status: AC
  Filled 2016-09-27: qty 2

## 2016-09-27 MED ORDER — POLYETHYLENE GLYCOL 3350 17 G PO PACK
17.0000 g | PACK | Freq: Every day | ORAL | Status: DC | PRN
  Filled 2016-09-27: qty 1

## 2016-09-27 MED ORDER — APIXABAN 2.5 MG PO TABS
2.5000 mg | ORAL_TABLET | Freq: Two times a day (BID) | ORAL | Status: DC
  Administered 2016-09-28 – 2016-09-30 (×5): 2.5 mg via ORAL
  Filled 2016-09-27 (×5): qty 1

## 2016-09-27 MED ORDER — PHENYLEPHRINE 40 MCG/ML (10ML) SYRINGE FOR IV PUSH (FOR BLOOD PRESSURE SUPPORT)
PREFILLED_SYRINGE | INTRAVENOUS | Status: AC
Start: 2016-09-27 — End: 2016-09-27
  Filled 2016-09-27: qty 10

## 2016-09-27 MED ORDER — ALBUTEROL SULFATE HFA 108 (90 BASE) MCG/ACT IN AERS
INHALATION_SPRAY | RESPIRATORY_TRACT | Status: DC | PRN
  Administered 2016-09-27 (×2): 2 via RESPIRATORY_TRACT

## 2016-09-27 MED ORDER — HYDROMORPHONE HCL 1 MG/ML IJ SOLN
0.2500 mg | INTRAMUSCULAR | Status: DC | PRN
  Administered 2016-09-27 (×4): 0.5 mg via INTRAVENOUS

## 2016-09-27 MED ORDER — SODIUM CHLORIDE 0.9 % IV SOLN: Freq: Once | INTRAVENOUS | Status: DC

## 2016-09-27 MED ORDER — VANCOMYCIN HCL 1000 MG IV SOLR
INTRAVENOUS | Status: DC | PRN
  Administered 2016-09-27: 1000 mg via INTRAVENOUS

## 2016-09-27 MED ORDER — FENTANYL CITRATE (PF) 100 MCG/2ML IJ SOLN
INTRAMUSCULAR | Status: DC | PRN
  Administered 2016-09-27 (×2): 50 ug via INTRAVENOUS
  Administered 2016-09-27 (×2): 100 ug via INTRAVENOUS
  Administered 2016-09-27 (×2): 50 ug via INTRAVENOUS
  Administered 2016-09-27: 100 ug via INTRAVENOUS
  Administered 2016-09-27 (×5): 50 ug via INTRAVENOUS

## 2016-09-27 MED ORDER — ACETAMINOPHEN 325 MG PO TABS: 650.0000 mg | ORAL_TABLET | Freq: Four times a day (QID) | ORAL | Status: DC | PRN

## 2016-09-27 MED ORDER — CEFAZOLIN SODIUM-DEXTROSE 2-4 GM/100ML-% IV SOLN
2.0000 g | Freq: Four times a day (QID) | INTRAVENOUS | Status: AC
  Administered 2016-09-27 – 2016-09-28 (×2): 2 g via INTRAVENOUS
  Filled 2016-09-27 (×3): qty 100

## 2016-09-27 MED ORDER — MIDAZOLAM HCL 2 MG/2ML IJ SOLN
INTRAMUSCULAR | Status: AC
  Administered 2016-09-27: 0.5 mg via INTRAVENOUS
  Filled 2016-09-27: qty 2

## 2016-09-27 MED ORDER — MIDAZOLAM HCL 5 MG/5ML IJ SOLN
INTRAMUSCULAR | Status: DC | PRN
  Administered 2016-09-27: 2 mg via INTRAVENOUS

## 2016-09-27 MED ORDER — DEXAMETHASONE SODIUM PHOSPHATE 10 MG/ML IJ SOLN
INTRAMUSCULAR | Status: AC
  Filled 2016-09-27: qty 2

## 2016-09-27 MED ORDER — DIPHENHYDRAMINE HCL 25 MG PO CAPS
50.0000 mg | ORAL_CAPSULE | Freq: Every day | ORAL | Status: DC
  Administered 2016-09-27 – 2016-09-29 (×3): 50 mg via ORAL
  Filled 2016-09-27 (×3): qty 2

## 2016-09-27 MED ORDER — EPHEDRINE 5 MG/ML INJ
INTRAVENOUS | Status: AC
  Filled 2016-09-27: qty 10

## 2016-09-27 MED ORDER — GABAPENTIN 300 MG PO CAPS
600.0000 mg | ORAL_CAPSULE | Freq: Three times a day (TID) | ORAL | Status: DC
  Administered 2016-09-27: 300 mg via ORAL
  Administered 2016-09-27 – 2016-09-30 (×9): 600 mg via ORAL
  Filled 2016-09-27 (×11): qty 2

## 2016-09-27 MED ORDER — LIDOCAINE HCL (CARDIAC) 20 MG/ML IV SOLN
INTRAVENOUS | Status: DC | PRN
  Administered 2016-09-27: 20 mg via INTRAVENOUS

## 2016-09-27 MED ORDER — SENNA 8.6 MG PO TABS
1.0000 | ORAL_TABLET | Freq: Two times a day (BID) | ORAL | Status: DC
  Administered 2016-09-27 – 2016-09-30 (×7): 8.6 mg via ORAL
  Filled 2016-09-27 (×7): qty 1

## 2016-09-27 MED ORDER — ROCURONIUM BROMIDE 10 MG/ML (PF) SYRINGE
PREFILLED_SYRINGE | INTRAVENOUS | Status: AC
  Filled 2016-09-27: qty 5

## 2016-09-27 MED ORDER — SUCCINYLCHOLINE CHLORIDE 200 MG/10ML IV SOSY
PREFILLED_SYRINGE | INTRAVENOUS | Status: AC
  Filled 2016-09-27: qty 10

## 2016-09-27 MED ORDER — LACTATED RINGERS IV SOLN
INTRAVENOUS | Status: DC | PRN
  Administered 2016-09-27 (×4): via INTRAVENOUS

## 2016-09-27 MED ORDER — MENTHOL 3 MG MT LOZG: 1.0000 | LOZENGE | OROMUCOSAL | Status: DC | PRN

## 2016-09-27 MED ORDER — HYDROMORPHONE HCL 1 MG/ML IJ SOLN
INTRAMUSCULAR | Status: AC
  Administered 2016-09-27: 0.5 mg via INTRAVENOUS
  Filled 2016-09-27: qty 1

## 2016-09-27 MED ORDER — LIDOCAINE 2% (20 MG/ML) 5 ML SYRINGE
INTRAMUSCULAR | Status: AC
  Filled 2016-09-27: qty 5

## 2016-09-27 MED ORDER — DOCUSATE SODIUM 100 MG PO CAPS
100.0000 mg | ORAL_CAPSULE | Freq: Two times a day (BID) | ORAL | Status: DC
  Administered 2016-09-27 – 2016-09-30 (×6): 100 mg via ORAL
  Filled 2016-09-27 (×6): qty 1

## 2016-09-27 MED ORDER — MEPERIDINE HCL 25 MG/ML IJ SOLN: 6.2500 mg | INTRAMUSCULAR | Status: DC | PRN

## 2016-09-27 MED ORDER — SODIUM CHLORIDE 0.9 % IR SOLN
Status: DC | PRN
  Administered 2016-09-27: 1000 mL
  Administered 2016-09-27: 3000 mL

## 2016-09-27 MED ORDER — ONDANSETRON HCL 4 MG PO TABS: 4.0000 mg | ORAL_TABLET | Freq: Four times a day (QID) | ORAL | Status: DC | PRN

## 2016-09-27 MED ORDER — SODIUM CHLORIDE 0.9 % IV SOLN
INTRAVENOUS | Status: DC | PRN
  Administered 2016-09-27: 10:00:00 via INTRAVENOUS

## 2016-09-27 MED ORDER — BUSPIRONE HCL 5 MG PO TABS
15.0000 mg | ORAL_TABLET | Freq: Four times a day (QID) | ORAL | Status: DC
  Administered 2016-09-27 – 2016-09-30 (×13): 15 mg via ORAL
  Filled 2016-09-27 (×13): qty 1

## 2016-09-27 MED ORDER — ONDANSETRON HCL 4 MG/2ML IJ SOLN
INTRAMUSCULAR | Status: DC | PRN
  Administered 2016-09-27: 4 mg via INTRAVENOUS

## 2016-09-27 MED ORDER — HYDROMORPHONE HCL 1 MG/ML IJ SOLN
0.5000 mg | INTRAMUSCULAR | Status: DC | PRN
  Administered 2016-09-27 – 2016-09-28 (×4): 1 mg via INTRAVENOUS
  Filled 2016-09-27 (×4): qty 1

## 2016-09-27 MED ORDER — EPHEDRINE SULFATE 50 MG/ML IJ SOLN
INTRAMUSCULAR | Status: DC | PRN
  Administered 2016-09-27: 15 mg via INTRAVENOUS
  Administered 2016-09-27: 5 mg via INTRAVENOUS
  Administered 2016-09-27: 10 mg via INTRAVENOUS

## 2016-09-27 MED ORDER — MIDAZOLAM HCL 2 MG/2ML IJ SOLN
0.5000 mg | Freq: Once | INTRAMUSCULAR | Status: AC | PRN
  Administered 2016-09-27 (×2): 0.5 mg via INTRAVENOUS

## 2016-09-27 MED ORDER — PROMETHAZINE HCL 25 MG/ML IJ SOLN: 6.2500 mg | INTRAMUSCULAR | Status: DC | PRN

## 2016-09-27 MED ORDER — CEFAZOLIN SODIUM-DEXTROSE 2-3 GM-% IV SOLR
INTRAVENOUS | Status: DC | PRN
  Administered 2016-09-27: 2 g via INTRAVENOUS

## 2016-09-27 MED ORDER — ONDANSETRON HCL 4 MG/2ML IJ SOLN
INTRAMUSCULAR | Status: AC
  Filled 2016-09-27: qty 2

## 2016-09-27 SURGICAL SUPPLY — 59 items
BAG DECANTER FOR FLEXI CONT (MISCELLANEOUS) IMPLANT
BLADE SAW SGTL 18X1.27X75 (BLADE) IMPLANT
BLADE SAW SGTL 18X1.27X75MM (BLADE)
CABLE CERLAGE W/CRIMP 1.8 (Cable) ×10 IMPLANT
CABLE CERLAGE W/CRIMP 1.8MM (Cable) ×5 IMPLANT
CHLORAPREP W/TINT 26ML (MISCELLANEOUS) ×3 IMPLANT
COVER SURGICAL LIGHT HANDLE (MISCELLANEOUS) ×3 IMPLANT
DERMABOND ADVANCED (GAUZE/BANDAGES/DRESSINGS) ×2
DERMABOND ADVANCED .7 DNX12 (GAUZE/BANDAGES/DRESSINGS) ×1 IMPLANT
DRAPE C-ARMOR (DRAPES) ×3 IMPLANT
DRAPE HIP W/POCKET STRL (DRAPE) ×3 IMPLANT
DRAPE INCISE IOBAN 66X45 STRL (DRAPES) ×3 IMPLANT
DRAPE INCISE IOBAN 85X60 (DRAPES) ×6 IMPLANT
DRAPE POUCH INSTRU U-SHP 10X18 (DRAPES) ×3 IMPLANT
DRAPE SURG 17X11 SM STRL (DRAPES) ×3 IMPLANT
DRAPE U-SHAPE 47X51 STRL (DRAPES) ×3 IMPLANT
DRESSING ALLEVYN LIFE SACRUM (GAUZE/BANDAGES/DRESSINGS) ×3 IMPLANT
DRSG AQUACEL AG ADV 3.5X 4 (GAUZE/BANDAGES/DRESSINGS) ×3 IMPLANT
DRSG AQUACEL AG ADV 3.5X10 (GAUZE/BANDAGES/DRESSINGS) ×3 IMPLANT
DRSG AQUACEL AG ADV 3.5X14 (GAUZE/BANDAGES/DRESSINGS) ×3 IMPLANT
ELECT BLADE 4.0 EZ CLEAN MEGAD (MISCELLANEOUS) ×3
ELECT BLADE TIP CTD 4 INCH (ELECTRODE) ×3 IMPLANT
ELECT REM PT RETURN 9FT ADLT (ELECTROSURGICAL) ×3
ELECTRODE BLDE 4.0 EZ CLN MEGD (MISCELLANEOUS) ×1 IMPLANT
ELECTRODE REM PT RTRN 9FT ADLT (ELECTROSURGICAL) ×1 IMPLANT
FACESHIELD WRAPAROUND (MASK) ×9 IMPLANT
GLOVE BIO SURGEON STRL SZ8.5 (GLOVE) ×18 IMPLANT
GLOVE BIOGEL PI IND STRL 8.5 (GLOVE) ×6 IMPLANT
GLOVE BIOGEL PI INDICATOR 8.5 (GLOVE) ×12
GOWN SPEC L3 XXLG W/TWL (GOWN DISPOSABLE) ×6 IMPLANT
GOWN STRL REUS W/TWL 2XL LVL3 (GOWN DISPOSABLE) ×3 IMPLANT
HANDPIECE INTERPULSE COAX TIP (DISPOSABLE) ×2
HEAD CERAMIC 36 PLUS 8.5 12 14 (Hips) ×3 IMPLANT
KIT BASIN OR (CUSTOM PROCEDURE TRAY) ×3 IMPLANT
MANIFOLD NEPTUNE II (INSTRUMENTS) ×3 IMPLANT
NDL SAFETY ECLIPSE 18X1.5 (NEEDLE) ×1 IMPLANT
NEEDLE HYPO 18GX1.5 SHARP (NEEDLE) ×2
NS IRRIG 1000ML POUR BTL (IV SOLUTION) ×3 IMPLANT
PACK TOTAL JOINT (CUSTOM PROCEDURE TRAY) ×3 IMPLANT
SEALER BIPOLAR AQUA 6.0 (INSTRUMENTS) ×3 IMPLANT
SET HNDPC FAN SPRY TIP SCT (DISPOSABLE) ×1 IMPLANT
STAPLER VISISTAT 35W (STAPLE) ×9 IMPLANT
SUCTION FRAZIER HANDLE 10FR (MISCELLANEOUS) ×2
SUCTION TUBE FRAZIER 10FR DISP (MISCELLANEOUS) ×1 IMPLANT
SUT ETHIBOND 2 V 37 (SUTURE) ×6 IMPLANT
SUT MNCRL AB 4-0 PS2 18 (SUTURE) ×6 IMPLANT
SUT MON AB 2-0 CT1 36 (SUTURE) ×6 IMPLANT
SUT VIC AB 0 CT1 27 (SUTURE) ×6
SUT VIC AB 0 CT1 27XBRD ANBCTR (SUTURE) ×3 IMPLANT
SUT VIC AB 1 CT1 27 (SUTURE) ×14
SUT VIC AB 1 CT1 27XBRD ANBCTR (SUTURE) ×7 IMPLANT
SUT VIC AB 2-0 CT1 27 (SUTURE) ×6
SUT VIC AB 2-0 CT1 TAPERPNT 27 (SUTURE) ×3 IMPLANT
SUT VLOC 180 0 24IN GS25 (SUTURE) ×6 IMPLANT
SYR 50ML LL SCALE MARK (SYRINGE) ×3 IMPLANT
SYSTEM SOLUTION 10 INCH RT (Hips) ×3 IMPLANT
TOWEL OR 17X26 10 PK STRL BLUE (TOWEL DISPOSABLE) ×6 IMPLANT
TRAY CATH 16FR W/PLASTIC CATH (SET/KITS/TRAYS/PACK) IMPLANT
TRAY FOLEY W/METER SILVER 16FR (SET/KITS/TRAYS/PACK) ×3 IMPLANT

## 2016-09-27 NOTE — OR Nursing (Signed)
AQUAMANTYS GENERATOR USED AQU 1

## 2016-09-27 NOTE — Brief Op Note (Signed)
09/26/2016 - 09/27/2016  12:06 PM  PATIENT:  Russell Steele  61 y.o. male  PRE-OPERATIVE DIAGNOSIS:  Right hip periprosthetic fracture.  POST-OPERATIVE DIAGNOSIS:  Right hip periprosthetic fracture.  PROCEDURE:  Procedure(s) with comments: OPEN REDUCTION INTERNAL FIXATION (ORIF) PERIPROSTHETIC FRACTURE (Right) - Femeral Component Rev, DEPUY AML Stem, Zimmer Cables, LATERAL POSITION WITH MARK II. POSTERIOR APPROACH. TOTAL HIP ARTHROPLASTY revision of femoral component (Right)  SURGEON:  Surgeon(s) and Role:    * Taryne Kiger, Arlys JohnBrian, MD - Primary  PHYSICIAN ASSISTANT: none  ASSISTANTS: carla bethune rnfa   ANESTHESIA:   general  EBL:  Total I/O In: 3959 [I.V.:3150; Blood:309; IV Piggyback:500] Out: 1625 [Urine:475; Blood:1150]  BLOOD ADMINISTERED:1 unit CC PRBC  DRAINS: (1 medium) Hemovact drain(s) in the right hip with  Suction Open   LOCAL MEDICATIONS USED:  NONE  SPECIMEN:  Source of Specimen:  right hip implants  DISPOSITION OF SPECIMEN:  PATHOLOGY  COUNTS:  YES  TOURNIQUET:  * No tourniquets in log *  DICTATION: .Other Dictation: Dictation Number [id number]  PLAN OF CARE: Admit to inpatient   PATIENT DISPOSITION:  PACU - hemodynamically stable.   Delay start of Pharmacological VTE agent (>24hrs) due to surgical blood loss or risk of bleeding: no

## 2016-09-27 NOTE — Transfer of Care (Signed)
Immediate Anesthesia Transfer of Care Note  Patient: Candice CampVictor Bobrowski  Procedure(s) Performed: Procedure(s) with comments: OPEN REDUCTION INTERNAL FIXATION (ORIF) PERIPROSTHETIC FRACTURE (Right) - Femeral Component Rev, DEPUY AML Stem, Zimmer Cables, LATERAL POSITION WITH MARK II. POSTERIOR APPROACH. TOTAL HIP ARTHROPLASTY revision of femoral component (Right)  Patient Location: PACU  Anesthesia Type:General  Level of Consciousness: awake, alert  and oriented  Airway & Oxygen Therapy: Patient Spontanous Breathing and Patient connected to nasal cannula oxygen  Post-op Assessment: Report given to RN and Post -op Vital signs reviewed and stable  Post vital signs: Reviewed and stable  Last Vitals:  Vitals:   09/27/16 0555 09/27/16 1215  BP: 124/74 (!) 154/79  Pulse: (!) 48 99  Resp: 16 12  Temp: 36.7 C 36.4 C    Last Pain:  Vitals:   09/27/16 1001  TempSrc:   PainSc: 3          Complications: No apparent anesthesia complications

## 2016-09-27 NOTE — Progress Notes (Signed)
Pt hs been resting in bed since arrival from PACU. HEmovac drain in place to right thigh incision. Output = of red blood. Medicated as rx'd for discomfort.

## 2016-09-27 NOTE — OR Nursing (Signed)
Patient requested implant to take home right femoral head and stem sent to lab with not stating to return to patient

## 2016-09-27 NOTE — Op Note (Deleted)
  The note originally documented on this encounter has been moved the the encounter in which it belongs.  

## 2016-09-27 NOTE — Anesthesia Preprocedure Evaluation (Addendum)
Anesthesia Evaluation  Patient identified by MRN, date of birth, ID band Patient awake    Reviewed: Allergy & Precautions, NPO status , Patient's Chart, lab work & pertinent test results  History of Anesthesia Complications Negative for: history of anesthetic complications  Airway Mallampati: I  TM Distance: >3 FB Neck ROM: Full    Dental  (+) Edentulous Upper, Edentulous Lower   Pulmonary sleep apnea and Continuous Positive Airway Pressure Ventilation , COPD,  COPD inhaler, Current Smoker, former smoker,     + wheezing (end exp )      Cardiovascular (-) anginanegative cardio ROS   Rhythm:Regular Rate:Normal     Neuro/Psych Anxiety Depression negative neurological ROS     GI/Hepatic negative GI ROS, Neg liver ROS,   Endo/Other  negative endocrine ROS  Renal/GU negative Renal ROS     Musculoskeletal Avascular necrosis hip   Abdominal   Peds  Hematology negative hematology ROS (+)   Anesthesia Other Findings   Reproductive/Obstetrics                           Anesthesia Physical Anesthesia Plan  ASA: III  Anesthesia Plan: General   Post-op Pain Management:    Induction: Intravenous  Airway Management Planned: Oral ETT  Additional Equipment:   Intra-op Plan:   Post-operative Plan: Extubation in OR  Informed Consent: I have reviewed the patients History and Physical, chart, labs and discussed the procedure including the risks, benefits and alternatives for the proposed anesthesia with the patient or authorized representative who has indicated his/her understanding and acceptance.     Plan Discussed with: CRNA and Surgeon  Anesthesia Plan Comments: (Plan routine monitors, GETA)        Anesthesia Quick Evaluation

## 2016-09-27 NOTE — Anesthesia Procedure Notes (Signed)
Procedure Name: Intubation Date/Time: 09/27/2016 8:01 AM Performed by: Russell Steele, [name] Pre-anesthesia Checklist: Patient identified, Emergency Drugs available, Suction available and Patient being monitored Patient Re-evaluated:Patient Re-evaluated prior to inductionOxygen Delivery Method: Circle System Utilized Preoxygenation: Pre-oxygenation with 100% oxygen Intubation Type: IV induction Ventilation: Mask ventilation without difficulty Tube type: Oral Number of attempts: 1 Airway Equipment and Method: Stylet and Oral airway Placement Confirmation: ETT inserted through vocal cords under direct vision,  positive ETCO2 and breath sounds checked- equal and bilateral Secured at: 23 cm Tube secured with: Tape Dental Injury: Teeth and Oropharynx as per pre-operative assessment

## 2016-09-27 NOTE — Progress Notes (Signed)
Orthopedic Tech Progress Note Patient Details:  Russell Steele 04/14/1956 [id number]  Patient ID: Russell CampVictor Steele, male   DOB: 01/11/1956, 61 y.o.   MRN: [id number]   Nikki DomCrawford, Raymonda Pell 09/27/2016, 3:40 PM Called in bio-tech brace order; [facility]

## 2016-09-27 NOTE — Anesthesia Postprocedure Evaluation (Signed)
Anesthesia Post Note  Patient: Russell Steele  Procedure(s) Performed: Procedure(s) (LRB): OPEN REDUCTION INTERNAL FIXATION (ORIF) PERIPROSTHETIC FRACTURE (Right) TOTAL HIP ARTHROPLASTY revision of femoral component (Right)  Patient location during evaluation: PACU Anesthesia Type: General Level of consciousness: sedated, patient cooperative and oriented Pain management: pain level controlled Vital Signs Assessment: post-procedure vital signs reviewed and stable Respiratory status: spontaneous breathing, nonlabored ventilation, respiratory function stable and patient connected to nasal cannula oxygen Cardiovascular status: blood pressure returned to baseline and stable Postop Assessment: no signs of nausea or vomiting Anesthetic complications: no       Last Vitals:  Vitals:   09/27/16 1345 09/27/16 1400  BP: 129/74 135/79  Pulse: 77 81  Resp: 10 12  Temp:  36.6 C    Last Pain:  Vitals:   09/27/16 1400  TempSrc:   PainSc: Asleep                 Diquan Kassis,E. Minka Knight

## 2016-09-27 NOTE — Progress Notes (Signed)
Russell Steele would like to have his home medications resumed.  He is particularly concerned with his pain and sleep medications.

## 2016-09-27 NOTE — H&P (Signed)
ORTHOPAEDIC H&P  REQUESTING PHYSICIAN: Samson FredericSwinteck, Sakai Heinle, MD  PCP:  System, Pcp Not In  Chief Complaint: Periprosthetic right femur fracture  HPI: Russell Steele is a 61 y.o. male who complains of right hip pain after a fall about 2 weeks ago. 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 He had progressive right hip pain and inability to weight-bear. He was seen in the office last week, and x-rays revealed subsidence of the femoral component. A CT scan was obtained showing a [place] B2 periprosthetic femur fracture and a nondisplaced high ramus pelvic fracture. He was admitted to the hospital for surgical intervention.  Past Medical History:  Diagnosis Date  . Anxiety   . Arthritis   . Depression   . History of kidney stones   . Sleep apnea    Past Surgical History:  Procedure Laterality Date  . AMPUTATION TOE    . APPENDECTOMY    . BACK SURGERY    . DENTAL SURGERY    . KNEE ARTHROSCOPY    . TOTAL HIP ARTHROPLASTY Right 08/10/2016   Procedure: RIGHT TOTAL HIP ARTHROPLASTY ANTERIOR APPROACH;  Surgeon: Samson FredericBrian Chemika Nightengale, MD;  Location: [facility] OR;  Service: Orthopedics;  Laterality: Right;   Social History   Social History  . Marital status: Married    Spouse name: N/A  . Number of children: N/A  . Years of education: N/A   Social History Main Topics  . Smoking status: Former Smoker    Packs/day: 0.50    Types: Cigarettes    Quit date: 06/30/2016  . Smokeless tobacco: Never Used  . Alcohol use 1.8 oz/week    3 Cans of beer per week  . Drug use: No  . Sexual activity: Not on file   Other Topics Concern  . Not on file   Social History Narrative  . No narrative on file   No family history on file. Allergies  Allergen Reactions  . Bee Venom Anaphylaxis   Prior to Admission medications   Medication Sig Start Date End Date Taking? Authorizing Provider  acetaminophen (TYLENOL) 500 MG tablet Take 1,000 mg by  mouth every 6 (six) hours as needed for mild pain.   Yes [provider]  albuterol (PROVENTIL HFA;VENTOLIN HFA) 108 (90 Base) MCG/ACT inhaler Inhale 2 puffs into the lungs every 6 (six) hours as needed for wheezing or shortness of breath.   Yes [provider]  aspirin 81 MG chewable tablet Chew 1 tablet (81 mg total) by mouth 2 (two) times daily. 08/11/16  Yes Felix Pratt, Arlys JohnBrian, MD  busPIRone (BUSPAR) 15 MG tablet Take 15 mg by mouth 4 (four) times daily.    Yes [provider]  diphenhydrAMINE (BENADRYL) 25 mg capsule Take 50 mg by mouth at bedtime.    Yes [provider]  FLUoxetine (PROZAC) 20 MG capsule Take 20 mg by mouth daily.   Yes [provider]  gabapentin (NEURONTIN) 300 MG capsule Take 600 mg by mouth 3 (three) times daily.   Yes [provider]  Multiple Vitamin (MULTIVITAMIN WITH MINERALS) TABS tablet Take 1 tablet by mouth daily.   Yes [provider]  oxyCODONE (OXY IR/ROXICODONE) 5 MG immediate release tablet Take 3-4 tablets (15-20 mg total) by mouth every 3 (three) hours as needed for breakthrough pain. Patient taking differently: Take 15 mg by mouth 4 (four) times daily.  08/11/16  Yes Anasia Agro, Arlys JohnBrian, MD  simvastatin (ZOCOR) 20  MG tablet Take 20 mg by mouth at bedtime.   Yes [provider]  tamsulosin (FLOMAX) 0.4 MG CAPS capsule Take 0.4 mg by mouth at bedtime.   Yes [provider]  docusate sodium (COLACE) 100 MG capsule Take 1 capsule (100 mg total) by mouth 2 (two) times daily. Patient not taking: Reported on 09/26/2016 08/11/16   Samson Frederic, MD  ondansetron (ZOFRAN) 4 MG tablet Take 1 tablet (4 mg total) by mouth every 6 (six) hours as needed for nausea. Patient not taking: Reported on 09/26/2016 08/11/16   Samson Frederic, MD  oxymetazoline (AFRIN) 0.05 % nasal spray Place 1 spray into both nostrils 2 (two) times daily as needed for congestion.    [provider]  senna (SENOKOT)  8.6 MG TABS tablet Take 2 tablets (17.2 mg total) by mouth at bedtime. Patient not taking: Reported on 09/26/2016 08/11/16   Samson Frederic, MD   No results found.  Positive ROS: All other systems have been reviewed and were otherwise negative with the exception of those mentioned in the HPI and as above.  Physical Exam: General: Alert, no acute distress Cardiovascular: No pedal edema Respiratory: No cyanosis, no use of accessory musculature GI: No organomegaly, abdomen is soft and non-tender Skin: No lesions in the area of chief complaint Neurologic: Sensation intact distally Psychiatric: Patient is competent for consent with normal mood and affect Lymphatic: No axillary or cervical lymphadenopathy  MUSCULOSKELETAL: Examination of the right lower extremity reveals a well-healed anterior incision. He has pain with attempted logrolling of the hip. He is neurovascularly intact.  Assessment: Right Vancouver B2 periprosthetic femur fracture.  Plan: I discussed the findings with the patient. I recommended revision right total hip arthroplasty. We discussed the risks, benefits, and alternatives. He understands the biggest risks include infection and instability. He elects and wishes to proceed. Postoperatively, we will have to protect his weightbearing for approximately 6 weeks while the fracture heals.  The risks, benefits, and alternatives were discussed with the patient. There are risks associated with the surgery including, but not limited to, problems with anesthesia (death), infection, instability (giving out of the joint), dislocation, differences in leg length/angulation/rotation, fracture of bones, malunion, nonunion, loosening or failure of implants, hematoma (blood accumulation) which may require surgical drainage, blood clots, pulmonary embolism, nerve injury (foot drop and lateral thigh numbness), and blood vessel injury. The patient understands these risks and elects to  proceed.    Taeko Schaffer, Cloyde Reams, MD Cell [phone]    09/27/2016 7:31 AM

## 2016-09-27 NOTE — Op Note (Signed)
NAME:  Teryl LucySPEAR, Hale                ACCOUNT NO.:  [id number]  MEDICAL RECORD NO.:  [id number]  LOCATION:                               FACILITY:  [facility]  PHYSICIAN:  Samson FredericBrian Shriyans Kuenzi, MD     DATE OF BIRTH:  1955/07/12  DATE OF PROCEDURE:  09/27/2016 DATE OF DISCHARGE:                              OPERATIVE REPORT   SURGEON:  Samson FredericBrian Darvin Dials, MD  ASSISTANT:  Patrick Jupiterarla Bethune, RNFA.  PREOPERATIVE DIAGNOSIS:  Right [place] B2 periprosthetic femur fracture.  POSTOPERATIVE DIAGNOSIS:  Right [place] B2 periprosthetic femur fracture.  PROCEDURE PERFORMED:  Revision right total hip arthroplasty, entire femoral component.  IMPLANTS: 1. DePuy Solution femoral stem, 10-inch bowed, size 15-mm, large     stature. 2. Biolox femoral head, 36+ 8.5 mm.  EXPLANTS: 1. DePuy Tri-Lock size 8 high-offset stem. 2. Biolox 36+ 1.5 head ball.  ANESTHESIA:  General.  ANTIBIOTICS: 1. 2 g Ancef. 2. 1 g vancomycin.  COMPLICATIONS:  None.  TUBES AND DRAINS:  Medium Hemovac x1.  EBL:  1100 mL.  BLOOD PRODUCTS:  1 unit packed red blood cells.  DISPOSITION:  Stable to PACU.  INDICATIONS:  The patient is a 61 year old male, underwent primary right total hip arthroplasty about 6 weeks ago.  He was doing very well until he fell on his right hip about 2 weeks ago.  He came to the office last week.  X-rays showed subsidence of his femoral stem about 8 mm.  CT scan was obtained showing a nondisplaced high ramus fracture as well as extensive proximal femur fracture.  We discussed the risks, benefits and alternatives to surgical intervention.  The patient elected to proceed.  DESCRIPTION OF PROCEDURE IN DETAIL:  I identified the patient in the holding using 2 identifiers.  Surgical site was marked by myself.  He was taken to the operating room.  He was placed supine on the operating room table.  General anesthesia was induced.  He was flipped in the left lateral decubitus position.  Axillary roll was  placed.  He was secured with the hip positioner.  All bony prominences were well padded.  The right hip was prepped and draped in normal sterile surgical fashion. Time-out was called verifying side and site of surgery.  I made a standard posterior approach to the hip.  IT band was split in line with fibers.  The sciatic nerve was palpated and protected throughout the  procedure. Charnley was placed.  The short external rotators and the posterior capsule were taken down in one layer.  I tagged the posterior capsule.  The hip was dislocated.  Stem was grossly loose and subsided. I removed the stem with the loop extractor.  I exposed the acetabulum,  and the acetabular component was well fixed. I then began examining the femur, I lifted the vastus lateralis off the posterior septum.  The muscle was translated anteriorly.  I placed a prophylactic wire well distal where the Tri-Lock stem was.  I then placed a total of four additional reconstruction cables.  I placed one cable just above the lesser trochanter.  I placed one cable about a fingerbreadth below the lesser trochanter and then I placed two  additional cables distal to this area.  I then exposed the proximal femur.  I placed a reamer for an 10- inch stem.  I checked the reamer position on x-ray and I had definitely bypassed the distal aspect of the fracture by four cortices.  I then reamed up to a 15-mm reamer, got excellent chatter.  I sequentially  broached up to a  size 15 large body AML stem. I selected a real size 15 bowed large stature stem, this was impacted into place while controlling version.  Head trials were undertaken and then I placed the real head ball.  The hip was reduced.  This hip was stable in potty position.  Soft tissue tension was appropriate.  Leg lengths were palpated and felt to be appropriate.  Stability testing was further performed with the hip at 90 degrees of flexion and 0 degrees of abduction.  I was  able to internally rotate him up greater than 70 degrees before there was any impingement.  I then copiously irrigated the hip with saline using pulse lavage.  I closed the IT band over a medium Hemovac drain with #1 Vicryl and 0 V-Loc.  Multiple layer closure was then performed.  #1 Vicryl in the deep fatty tissue, 2-0 Vicryl in the fatty tissue superficial to this and then 2-0 Monocryl deep dermal layer.  Skin was closed with staples.  Glue was applied.  Then once the glue was dried, Primapore was placed.  The patient was flipped supine, abduction wedge was placed.  He was extubated, taken to the PACU in stable condition.  Sponge, needle and instrument counts were correct at the end of the case x2.  There were no complications.  Postoperatively, we will admit him to the hospital, touch down weightbearing with a walker.  He will receive physical and occupational therapy.  We will order a hip abduction brace to keep him at 15 degrees of abduction and we will allow 0-60 degrees of flexion.  He will undergo disposition planning.  I will see him in the office 2 weeks after discharge for suture removal.    ______________________________ Samson Frederic, MD   ______________________________ Samson Frederic, MD    BS/MEDQ  D:  09/27/2016  T:  09/27/2016  Job:  [id number]

## 2016-09-28 ENCOUNTER — Encounter (HOSPITAL_COMMUNITY): Payer: Self-pay | Admitting: General Practice

## 2016-09-28 LAB — CBC
HCT: 30.9 % — ABNORMAL LOW (ref 39.0–52.0)
HEMOGLOBIN: 9.7 g/dL — AB (ref 13.0–17.0)
MCH: 30.5 pg (ref 26.0–34.0)
MCHC: 31.4 g/dL (ref 30.0–36.0)
MCV: 97.2 fL (ref 78.0–100.0)
PLATELETS: 226 10*3/uL (ref 150–400)
RBC: 3.18 MIL/uL — ABNORMAL LOW (ref 4.22–5.81)
RDW: 15.1 % (ref 11.5–15.5)
WBC: 10.6 10*3/uL — ABNORMAL HIGH (ref 4.0–10.5)

## 2016-09-28 LAB — BASIC METABOLIC PANEL
Anion gap: 8 (ref 5–15)
BUN: 9 mg/dL (ref 6–20)
CALCIUM: 8.8 mg/dL — AB (ref 8.9–10.3)
CHLORIDE: 102 mmol/L (ref 101–111)
CO2: 31 mmol/L (ref 22–32)
CREATININE: 1.07 mg/dL (ref 0.61–1.24)
GFR calc non Af Amer: >60 mL/min (ref >60)
Glucose, Bld: 142 mg/dL — ABNORMAL HIGH (ref 65–99)
Potassium: 4.6 mmol/L (ref 3.5–5.1)
Sodium: 141 mmol/L (ref 135–145)

## 2016-09-28 NOTE — Discharge Instructions (Signed)
Dr. Rod Can Joint Replacement Specialist [facility] [address], [place], [place] [address] [phone]   TOTAL HIP REPLACEMENT POSTOPERATIVE DIRECTIONS    Hip Rehabilitation, Guidelines Following Surgery   WEIGHT BEARING Partial weight bearing with assist device as directed.  Touch Down Weight Bearing Right Leg with walker  The results of a hip operation are greatly improved after range of motion and muscle strengthening exercises. Follow all safety measures which are given to protect your hip. If any of these exercises cause increased pain or swelling in your joint, decrease the amount until you are comfortable again. Then slowly increase the exercises. Call your caregiver if you have problems or questions.   HOME CARE INSTRUCTIONS  Most of the following instructions are designed to prevent the dislocation of your new hip.  Remove items at home which could result in a fall. This includes throw rugs or furniture in walking pathways.  Continue medications as instructed at time of discharge.  You may have some home medications which will be placed on hold until you complete the course of blood thinner medication.  You may start showering once you are discharged home. Do not remove your dressing. Do not put on socks or shoes without following the instructions of your caregivers.   Sit on chairs with arms. Use the chair arms to help push yourself up when arising.  Arrange for the use of a toilet seat elevator so you are not sitting low.   Walk with walker as instructed.  You may resume a sexual relationship in one month or when given the OK by your caregiver.  Use walker as long as suggested by your caregivers.  Avoid periods of inactivity such as sitting longer than an hour when not asleep. This helps prevent blood clots.  You may return to work once you are cleared by Engineer, production.  Do not drive a car for 6 weeks or until released by your  surgeon.  Do not drive while taking narcotics.  Wear elastic stockings for two weeks following surgery during the day but you may remove then at night.  Make sure you keep all of your appointments after your operation with all of your doctors and caregivers. You should call the office at the above phone number and make an appointment for approximately two weeks after the date of your surgery. Please pick up a stool softener and laxative for home use as long as you are requiring pain medications.  ICE to the affected hip every three hours for 30 minutes at a time and then as needed for pain and swelling. Continue to use ice on the hip for pain and swelling from surgery. You may notice swelling that will progress down to the foot and ankle.  This is normal after surgery.  Elevate the leg when you are not up walking on it.   It is important for you to complete the blood thinner medication as prescribed by your doctor.  Continue to use the breathing machine which will help keep your temperature down.  It is common for your temperature to cycle up and down following surgery, especially at night when you are not up moving around and exerting yourself.  The breathing machine keeps your lungs expanded and your temperature down.  RANGE OF MOTION AND STRENGTHENING EXERCISES  These exercises are designed to help you keep full movement of your hip joint. Follow your caregiver's or physical therapist's instructions. Perform all exercises about fifteen times, three times  per day or as directed. Exercise both hips, even if you have had only one joint replacement. These exercises can be done on a training (exercise) mat, on the floor, on a table or on a bed. Use whatever works the best and is most comfortable for you. Use music or television while you are exercising so that the exercises are a pleasant break in your day. This will make your life better with the exercises acting as a break in routine you can look forward  to.  Lying on your back, slowly slide your foot toward your buttocks, raising your knee up off the floor. Then slowly slide your foot back down until your leg is straight again.  Lying on your back spread your legs as far apart as you can without causing discomfort.  Lying on your side, raise your upper leg and foot straight up from the floor as far as is comfortable. Slowly lower the leg and repeat.  Lying on your back, tighten up the muscle in the front of your thigh (quadriceps muscles). You can do this by keeping your leg straight and trying to raise your heel off the floor. This helps strengthen the largest muscle supporting your knee.  Lying on your back, tighten up the muscles of your buttocks both with the legs straight and with the knee bent at a comfortable angle while keeping your heel on the floor.   SKILLED REHAB INSTRUCTIONS: If the patient is transferred to a skilled rehab facility following release from the hospital, a list of the current medications will be sent to the facility for the patient to continue.  When discharged from the skilled rehab facility, please have the facility set up the patient's Home Health Physical Therapy prior to being released. Also, the skilled facility will be responsible for providing the patient with their medications at time of release from the facility to include their pain medication and their blood thinner medication. If the patient is still at the rehab facility at time of the two week follow up appointment, the skilled rehab facility will also need to assist the patient in arranging follow up appointment in our office and any transportation needs.  MAKE SURE YOU:  Understand these instructions.  Will watch your condition.  Will get help right away if you are not doing well or get worse.  Pick up stool softner and laxative for home use following surgery while on pain medications. Do not remove your dressing. The dressing is waterproof--it is OK to  take showers. Continue to use ice for pain and swelling after surgery. Do not use any lotions or creams on the incision until instructed by your surgeon. Total Hip Protocol. POSTERIOR HIP PRECAUTIONS. WEAR HIP BRACE AT ALL TIMES.   Information on my medicine - ELIQUIS (apixaban)  Why was Eliquis prescribed for you? Eliquis was prescribed for you to reduce the risk of blood clots forming after orthopedic surgery.    What do You need to know about Eliquis? Take your Eliquis TWICE DAILY - one tablet in the morning and one tablet in the evening with or without food.  It would be best to take the dose about the same time each day.  If you have difficulty swallowing the tablet whole please discuss with your pharmacist how to take the medication safely.  Take Eliquis exactly as prescribed by your doctor and DO NOT stop taking Eliquis without talking to the doctor who prescribed the medication.  Stopping without other medication to take  the place of Eliquis may increase your risk of developing a clot.  After discharge, you should have regular check-up appointments with your healthcare provider that is prescribing your Eliquis.  What do you do if you miss a dose? If a dose of ELIQUIS is not taken at the scheduled time, take it as soon as possible on the same day and twice-daily administration should be resumed.  The dose should not be doubled to make up for a missed dose.  Do not take more than one tablet of ELIQUIS at the same time.  Important Safety Information A possible side effect of Eliquis is bleeding. You should call your healthcare provider right away if you experience any of the following: ? Bleeding from an injury or your nose that does not stop. ? Unusual colored urine (red or dark brown) or unusual colored stools (red or black). ? Unusual bruising for unknown reasons. ? A serious fall or if you hit your head (even if there is no bleeding).  Some medicines may interact  with Eliquis and might increase your risk of bleeding or clotting while on Eliquis. To help avoid this, consult your healthcare provider or pharmacist prior to using any new prescription or non-prescription medications, including herbals, vitamins, non-steroidal anti-inflammatory drugs (NSAIDs) and supplements.  This website has more information on Eliquis (apixaban): http://www.eliquis.com/eliquis/home

## 2016-09-28 NOTE — Progress Notes (Signed)
   Subjective: 1 Day Post-Op Procedure(s) (LRB): OPEN REDUCTION INTERNAL FIXATION (ORIF) PERIPROSTHETIC FRACTURE (Right) TOTAL HIP ARTHROPLASTY revision of femoral component (Right) Patient reports pain as moderate.   We will start therapy today.  Plan is to go Home after hospital stay.  Objective: Vital signs in last 24 hours: Temp:  [97.5 F (36.4 C)-99 F (37.2 C)] 98.7 F (37.1 C) (05/28 0300) Pulse Rate:  [70-99] 91 (05/28 0300) Resp:  [10-19] 17 (05/28 0300) BP: (109-154)/(61-93) 113/61 (05/28 0300) SpO2:  [92 %-100 %] 97 % (05/28 0300)  Intake/Output from previous day:  Intake/Output Summary (Last 24 hours) at 09/28/16 0643 Last data filed at 09/28/16 0600  Gross per 24 hour  Intake             4779 ml  Output             5995 ml  Net            -1216 ml    Intake/Output this shift: Total I/O In: -  Out: 3270 [Urine:3200; Drains:70]  Labs:  Recent Labs  09/26/16 1750 09/27/16 0940 09/27/16 1018  HGB 12.8* 11.6* 10.9*    Recent Labs  09/26/16 1750 09/27/16 0940 09/27/16 1018  WBC 6.1  --   --   RBC 4.14*  --   --   HCT 41.0 34.0* 32.0*  PLT 297  --   --     Recent Labs  09/26/16 1955 09/27/16 0940 09/27/16 1018  NA 136 140 141  K 4.1 4.1 4.1  CL 100*  --   --   CO2 30  --   --   BUN 14  --   --   CREATININE 1.16  --   --   GLUCOSE 118* 115* 114*  CALCIUM 9.1  --   --     Recent Labs  09/26/16 1750  INR 1.01    EXAM General - Patient is Alert, Appropriate and Oriented Extremity - Neurologically intact Neurovascular intact No cellulitis present Dressing - dressing C/D/I Motor Function - intact, moving foot and toes well on exam.  Hemovac pulled without difficulty.  Past Medical History:  Diagnosis Date  . Anxiety   . Arthritis   . Depression   . History of kidney stones   . Sleep apnea     Assessment/Plan: 1 Day Post-Op Procedure(s) (LRB): OPEN REDUCTION INTERNAL FIXATION (ORIF) PERIPROSTHETIC FRACTURE (Right) TOTAL  HIP ARTHROPLASTY revision of femoral component (Right) Principal Problem:   Periprosthetic fracture around internal prosthetic right hip joint (HCC) Active Problems:   Periprosthetic fracture around internal prosthetic hip joint, initial encounter   Advance diet Up with therapy D/C IV fluids Hemovac Pulled Begin Therapy  Loanne DrillingALUISIO,Nakai Pollio V

## 2016-09-28 NOTE — Progress Notes (Signed)
Pt requests to keep his foley catheter in this morning. Pt educated on infection and the importance of getting the foley out sooner than later. Pt states he hurts to move and wants to keep it in. Will get oncoming nurse to address on oncoming shift.

## 2016-09-28 NOTE — Progress Notes (Signed)
Nurse has notified pharmacy for pt's Ancef 2 times. Waiting on response. Will continue to monitor.

## 2016-09-28 NOTE — Evaluation (Signed)
Physical Therapy Evaluation Patient Details Name: Russell Steele MRN: [id number] DOB: 07/18/1955 Today's Date: 09/28/2016   History of Present Illness  61 y.o. male who complains of right hip pain after a fall about 2 weeks ago. 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 past medical history of Anxiety; Arthritis; Depression; History of kidney stones; and Sleep apnea. has a past surgical history that includes Back surgery; Knee arthroscopy; Amputation toe    Clinical Impression  Patient is s/p above surgery resulting in functional limitations due to the deficits listed below (see PT Problem List). Pt is currently mod A x2 for bed mobility, maxAx2 for transfers and modAx2 for ambulation of 3 feet with RW. Patient will benefit from skilled PT to increase their independence and safety with mobility to allow discharge to the venue listed below.       Follow Up Recommendations CIR;Supervision/Assistance - 24 hour    Equipment Recommendations  Other (comment) (to be determined at next venue)    Recommendations for Other Services Rehab consult     Precautions / Restrictions Precautions Precautions: Posterior Hip;Fall Precaution Booklet Issued: Yes (comment) Precaution Comments: Pt able to recall 3/3 hip precautions. Provided handout Required Braces or Orthoses: Other Brace/Splint (hip abduction brace) Other Brace/Splint: hip abduction brace Restrictions Weight Bearing Restrictions: Yes RLE Weight Bearing: Partial weight bearing RLE Partial Weight Bearing Percentage or Pounds: 30      Mobility  Bed Mobility Overal bed mobility: Needs Assistance Bed Mobility: Supine to Sit     Supine to sit: Mod assist;+2 for physical assistance;HOB elevated     General bed mobility comments: Pt used bedrails and required Mod A for transition hips to EOB  Transfers Overall transfer level: Needs assistance Equipment used: Rolling  walker (2 wheeled) Transfers: Sit to/from Stand Sit to Stand: Max assist;+2 physical assistance;From elevated surface         General transfer comment: Max A +2; one to manage RLE and other for standing support and safety  Ambulation/Gait Ambulation/Gait assistance: Mod assist;+2 physical assistance Ambulation Distance (Feet): 3 Feet Assistive device: Rolling walker (2 wheeled) Gait Pattern/deviations: Shuffle;Antalgic;Trunk flexed (hop to pattern ) Gait velocity: slowed Gait velocity interpretation: Below normal speed for age/gender General Gait Details: pt able to utilize UE on RW to advance L LE DIstance limited by pain      Balance Overall balance assessment: History of Falls                                           Pertinent Vitals/Pain Pain Assessment: 0-10 Pain Score: 8  Pain Location: R hip Pain Descriptors / Indicators: Constant;Discomfort;Grimacing;Guarding;Sharp;Shooting Pain Intervention(s): Monitored during session  VSS    Home Living Family/patient expects to be discharged to:: Private residence Living Arrangements: Spouse/significant other Available Help at Discharge: Family Type of Home: House Home Access: Ramped entrance     Home Layout: One level Home Equipment: Grab bars - tub/shower;Walker - standard;Wheelchair - manual;Bedside commode (lift chair; BSC too small for his hips)      Prior Function Level of Independence: Independent         Comments: Pt was doing well after prior R THA. 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     Hand Dominance   Dominant Hand: Right    Extremity/Trunk Assessment  Upper Extremity Assessment Upper Extremity Assessment: Overall WFL for tasks assessed    Lower Extremity Assessment Lower Extremity Assessment: Defer to PT evaluation RLE Deficits / Details: ROM and strength of hip and knee could not be assessed due to surgical pain RLE: Unable to fully assess due to  pain;Unable to fully assess due to immobilization RLE Coordination: decreased gross motor;decreased fine motor    Cervical / Trunk Assessment Cervical / Trunk Assessment: Normal  Communication   Communication: No difficulties  Cognition Arousal/Alertness: Awake/alert Behavior During Therapy: WFL for tasks assessed/performed Overall Cognitive Status: Within Functional Limits for tasks assessed                                        General Comments General comments (skin integrity, edema, etc.): Pt performed well on room air. No signs of dyspnea. Denies dizziness or SOB        Assessment/Plan    PT Assessment Patient needs continued PT services  PT Problem List Decreased strength;Decreased range of motion;Decreased activity tolerance;Decreased balance;Decreased mobility;Decreased knowledge of use of DME;Decreased knowledge of precautions;Pain       PT Treatment Interventions DME instruction;Gait training;Functional mobility training;Therapeutic activities;Therapeutic exercise;Balance training;Patient/family education    PT Goals (Current goals can be found in the Care Plan section)  Acute Rehab PT Goals Patient Stated Goal: Walk without pain PT Goal Formulation: With patient Time For Goal Achievement: 10/12/16 Potential to Achieve Goals: Good    Frequency Min 5X/week   Barriers to discharge Decreased caregiver support      Co-evaluation   Reason for Co-Treatment: Complexity of the patient's impairments (multi-system involvement) PT goals addressed during session: Mobility/safety with mobility OT goals addressed during session: ADL's and self-care       AM-PAC PT "6 Clicks" Daily Activity  Outcome Measure Difficulty turning over in bed (including adjusting bedclothes, sheets and blankets)?: Total Difficulty moving from lying on back to sitting on the side of the bed? : Total Difficulty sitting down on and standing up from a chair with arms (e.g.,  wheelchair, bedside commode, etc,.)?: Total Help needed moving to and from a bed to chair (including a wheelchair)?: A Lot Help needed walking in hospital room?: A Lot Help needed climbing 3-5 steps with a railing? : Total 6 Click Score: 8    End of Session Equipment Utilized During Treatment: Gait belt;Other (comment) (abduction brace) Activity Tolerance: Patient limited by pain Patient left: in chair;with call bell/phone within reach Nurse Communication: Patient requests pain meds;Mobility status;Weight bearing status;Precautions PT Visit Diagnosis: Unsteadiness on feet (R26.81);Other abnormalities of gait and mobility (R26.89);Muscle weakness (generalized) (M62.81);History of falling (Z91.81);Difficulty in walking, not elsewhere classified (R26.2);Pain Pain - Right/Left: Right Pain - part of body: Hip    Time: 0940-1006 PT Time Calculation (min) (ACUTE ONLY): 26 min   Charges:   PT Evaluation $PT Eval Low Complexity: 1 Procedure     PT G Codes:        Yanel Dombrosky B. Beverely RisenVan Fleet PT, DPT Acute Rehabilitation  [phone] Pager [phone]    Elon Alaslizabeth B Van Fleet 09/28/2016, 12:53 PM

## 2016-09-28 NOTE — Progress Notes (Signed)
Nurse went to empty pt's hemovac drain to the right leg. Hemovac difficult to open. Pt attempts to help nurse and hemovac splashes blood all over nurse and pt. Pt is HIV neg as of May 26th per lab results.

## 2016-09-28 NOTE — Progress Notes (Signed)
Rehab Admissions Coordinator Note:  Patient was screened by Clois DupesBoyette, Aseem Sessums Godwin for appropriateness for an Inpatient Acute Rehab Consult per PT and OT recommendations.s Noted plans were to d/[facility] home [facility] prior to therapy evaluations.  At this time, we are recommending Inpatient Rehab consult if Ortho MD would like pt considered for a possible inpt rehab admit. Please advise.  Clois DupesBoyette, Naelani Lafrance Godwin 09/28/2016, 1:29 PM  I can be reached at [phone].

## 2016-09-28 NOTE — Evaluation (Signed)
Occupational Therapy Evaluation Patient Details Name: Russell CampVictor Poppen MRN: [id number] DOB: 04/11/1956 Today's Date: 09/28/2016    History of Present Illness 61 y.o. male who complains of right hip pain after a fall about 2 weeks ago. 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 past medical history of Anxiety; Arthritis; Depression; History of kidney stones; and Sleep apnea. 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 Currently, pt is Max A for LB ADLs and Max A+2 for functional mobility. Pt highly motivated and wanted to participate in therapy despite significant pain. Pt would benefit from skilled acute OT to facilitate safe dc and increase independence with ADLs and functional mobility.  Recommend dc to CIR for intense therapy to optimize independence prior to transitioning home.     Follow Up Recommendations  CIR;Supervision/Assistance - 24 hour    Equipment Recommendations  Other (comment);Tub/shower bench (Defer to next venue)    Recommendations for Other Services Rehab consult     Precautions / Restrictions Precautions Precautions: Posterior Hip;Fall Precaution Booklet Issued: Yes (comment) Precaution Comments: Pt able to recall 3/3 hip precautions. Provided handout Required Braces or Orthoses: Other Brace/Splint (hip abduction brace) Other Brace/Splint: hip abduction brace Restrictions Weight Bearing Restrictions: Yes RLE Weight Bearing: Partial weight bearing RLE Partial Weight Bearing Percentage or Pounds: 30      Mobility Bed Mobility Overal bed mobility: Needs Assistance Bed Mobility: Supine to Sit     Supine to sit: Mod assist;+2 for physical assistance;HOB elevated     General bed mobility comments:  Pt used bedrails and required Mod A for transition hips to EOB  Transfers Overall transfer level: Needs assistance Equipment used: Rolling walker (2 wheeled) Transfers: Sit to/from Stand Sit to Stand: Max assist;+2 physical assistance;From elevated surface         General transfer comment: Max A +2; one to manage RLE and other for standing support and safety    Balance Overall balance assessment: History of Falls                                         ADL either performed or assessed with clinical judgement   ADL Overall ADL's : Needs assistance/impaired Eating/Feeding: Set up;Sitting   Grooming: Set up;Sitting   Upper Body Bathing: Set up;Sitting   Lower Body Bathing: Maximal assistance;Sit to/from stand   Upper Body Dressing : Set up;Sitting   Lower Body Dressing: Maximal assistance;Sit to/from stand   Toilet Transfer: Maximal assistance;+2 for physical assistance;+2 for safety/equipment;Stand-pivot;RW (Simulated to recliner)           Functional mobility during ADLs: Maximal assistance;Rolling walker General ADL Comments: Pt very motivated and willing to participate in therapy. Pt requires Max A +2 for transfers to A with managing RLE due to significant pain. Pt Max A for LB ADLs     Vision Baseline Vision/History: Wears glasses Wears Glasses: At all times Patient Visual Report: No change from baseline       Perception     Praxis      Pertinent Vitals/Pain Pain Assessment: 0-10 Pain Score: 8  Pain Location: R hip Pain Descriptors / Indicators: Constant;Discomfort;Grimacing;Guarding;Sharp;Shooting  Pain Intervention(s): Monitored during session     Hand Dominance Right   Extremity/Trunk Assessment Upper Extremity Assessment Upper Extremity Assessment: Overall WFL for tasks assessed   Lower Extremity Assessment Lower Extremity Assessment: Defer to PT evaluation RLE Deficits / Details: ROM and strength of hip and knee could not be  assessed due to surgical pain RLE: Unable to fully assess due to pain;Unable to fully assess due to immobilization RLE Coordination: decreased gross motor;decreased fine motor   Cervical / Trunk Assessment Cervical / Trunk Assessment: Normal   Communication Communication Communication: No difficulties   Cognition Arousal/Alertness: Awake/alert Behavior During Therapy: WFL for tasks assessed/performed Overall Cognitive Status: Within Functional Limits for tasks assessed                                     General Comments  Pt performed well on room air. No signs of dyspnea. Denies dizziness or SOB    Exercises     Shoulder Instructions      Home Living Family/patient expects to be discharged to:: Private residence Living Arrangements: Spouse/significant other Available Help at Discharge: Family Type of Home: House Home Access: Ramped entrance     Home Layout: One level     Bathroom Shower/Tub: Tub/shower unit;Curtain   Firefighter: Handicapped height Bathroom Accessibility: Yes   Home Equipment: Grab bars - tub/shower;Walker - standard;Wheelchair - manual;Bedside commode (lift chair; BSC too small for his hips)          Prior Functioning/Environment Level of Independence: Independent        Comments: Pt was doing well after prior R THA. 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        OT Problem List: Decreased strength;Decreased range of motion;Decreased activity tolerance;Impaired balance (sitting and/or standing);Decreased knowledge of use of DME or AE;Decreased knowledge of precautions;Pain      OT Treatment/Interventions: Self-care/ADL training;Therapeutic exercise;Energy conservation;DME and/or AE instruction;Therapeutic activities;Patient/family education    OT Goals(Current goals can be found in the care plan section) Acute Rehab OT Goals Patient Stated Goal: Walk without pain OT Goal Formulation: With  patient Time For Goal Achievement: 10/12/16 Potential to Achieve Goals: Good  OT Frequency: Min 2X/week   Barriers to D/C: Decreased caregiver support          Co-evaluation PT/OT/SLP Co-Evaluation/Treatment: Yes Reason for Co-Treatment: Complexity of the patient's impairments (multi-system involvement) PT goals addressed during session: Mobility/safety with mobility OT goals addressed during session: ADL's and self-care      AM-PAC PT "6 Clicks" Daily Activity     Outcome Measure Help from another person eating meals?: None Help from another person taking care of personal grooming?: A Little Help from another person toileting, which includes using toliet, bedpan, or urinal?: A Little Help from another person bathing (including washing, rinsing, drying)?: A Lot Help from another person to put on and taking off regular upper body clothing?: None Help from another person to put on and taking off regular lower body clothing?: A Lot 6 Click Score: 18   End of Session Equipment Utilized During Treatment: Gait belt;Rolling walker;Other (comment) (hip abduction brace) Nurse Communication: Mobility status;Patient requests pain meds  Activity Tolerance: Patient tolerated treatment well Patient left: in chair;with call bell/phone within reach  OT Visit Diagnosis: Unsteadiness on feet (R26.81);Muscle weakness (generalized) (M62.81);History of falling (Z91.81);Pain Pain - Right/Left: Right Pain - part of body: Leg  Time: 1914-7829 OT Time Calculation (min): 20 min Charges:  OT General Charges $OT Visit: 1 Procedure OT Evaluation $OT Eval Low Complexity: 1 Procedure G-Codes:     Aiysha Jillson, OTR/L [phone]  Theodoro Grist Chera Slivka 09/28/2016, 1:18 PM

## 2016-09-29 LAB — CBC
HCT: 28.9 % — ABNORMAL LOW (ref 39.0–52.0)
Hemoglobin: 9.2 g/dL — ABNORMAL LOW (ref 13.0–17.0)
MCH: 31.2 pg (ref 26.0–34.0)
MCHC: 31.8 g/dL (ref 30.0–36.0)
MCV: 98 fL (ref 78.0–100.0)
PLATELETS: 220 10*3/uL (ref 150–400)
RBC: 2.95 MIL/uL — ABNORMAL LOW (ref 4.22–5.81)
RDW: 14.8 % (ref 11.5–15.5)
WBC: 8.3 10*3/uL (ref 4.0–10.5)

## 2016-09-29 LAB — BASIC METABOLIC PANEL
Anion gap: 9 (ref 5–15)
BUN: 12 mg/dL (ref 6–20)
CALCIUM: 8.8 mg/dL — AB (ref 8.9–10.3)
CO2: 29 mmol/L (ref 22–32)
CREATININE: 1.25 mg/dL — AB (ref 0.61–1.24)
Chloride: 96 mmol/L — ABNORMAL LOW (ref 101–111)
GFR calc Af Amer: >60 mL/min (ref >60)
GLUCOSE: 114 mg/dL — AB (ref 65–99)
Potassium: 4.1 mmol/L (ref 3.5–5.1)
Sodium: 134 mmol/L — ABNORMAL LOW (ref 135–145)

## 2016-09-29 MED ORDER — OXYCODONE HCL 5 MG PO TABS: 10.0000 mg | ORAL_TABLET | ORAL | 0 refills | Status: DC | PRN

## 2016-09-29 MED ORDER — APIXABAN 2.5 MG PO TABS: 2.5000 mg | ORAL_TABLET | Freq: Two times a day (BID) | ORAL | 0 refills | Status: DC

## 2016-09-29 MED ORDER — ASPIRIN 81 MG PO CHEW: 81.0000 mg | CHEWABLE_TABLET | Freq: Every day | ORAL | 1 refills | Status: DC

## 2016-09-29 NOTE — Progress Notes (Signed)
Physical Therapy Treatment Patient Details Name: Russell Steele MRN: [id number] DOB: 03/28/1956 Today's Date: 09/29/2016    History of Present Illness 61 y.o. male who complains of right hip pain after a fall about 2 weeks ago. 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 past medical history of Anxiety; Arthritis; Depression; History of kidney stones; and Sleep apnea. has a past surgical history that includes Back surgery; Knee arthroscopy; Amputation toe    PT Comments    Pt is making good progress towards his goals. Pt currently modA for bed mobility, transfers with RW and ambulation of 18 feet with RW. Pt would tolerate and benefit from intensive therapy of CIR. Pt requires skilled PT to progress bed mobility, transfer and gait training and to improve UE and L LE strengthening and endurance to mobilize safely in his discharge environment.     Follow Up Recommendations  CIR;Supervision/Assistance - 24 hour     Equipment Recommendations  Other (comment) (to be determined at next venue)    Recommendations for Other Services Rehab consult     Precautions / Restrictions Precautions Precautions: Posterior Hip;Fall Precaution Booklet Issued: Yes (comment) Precaution Comments: Pt able to recall 3/3 hip precautions after being retaught today.Referred to provided handout Required Braces or Orthoses: Other Brace/Splint (hip abduction brace) Other Brace/Splint: hip abduction brace Restrictions Weight Bearing Restrictions: Yes RLE Weight Bearing: Partial weight bearing RLE Partial Weight Bearing Percentage or Pounds: 30    Mobility  Bed Mobility Overal bed mobility: Needs Assistance Bed Mobility: Supine to Sit     Supine to sit: Mod assist;HOB elevated     General bed mobility comments: Pt used bedrails and required Mod A for transitioning hips to EOB and legs down to floor  Transfers Overall transfer level: Needs  assistance Equipment used: Rolling walker (2 wheeled) Transfers: Sit to/from Stand Sit to Stand: From elevated surface;Mod assist         General transfer comment: modA for steadying in upright   Ambulation/Gait Ambulation/Gait assistance: Mod assist Ambulation Distance (Feet): 18 Feet Assistive device: Rolling walker (2 wheeled) Gait Pattern/deviations: Shuffle;Antalgic;Trunk flexed (hop to pattern ) Gait velocity: slowed Gait velocity interpretation: Below normal speed for age/gender General Gait Details: modA for stability vc for weightbearing pt able to utilize UE on RW to advance L LE Pt limited by UE fatigue       Balance Overall balance assessment: Needs assistance Sitting-balance support: No upper extremity supported;Feet supported Sitting balance-Leahy Scale: Fair     Standing balance support: Bilateral upper extremity supported Standing balance-Leahy Scale: Fair Standing balance comment: requires RW support                             Cognition Arousal/Alertness: Awake/alert Behavior During Therapy: WFL for tasks assessed/performed Overall Cognitive Status: Within Functional Limits for tasks assessed                                               Pertinent Vitals/Pain Pain Assessment: 0-10 Pain Score: 7  Pain Location: R hip Pain Descriptors / Indicators: Constant;Discomfort;Grimacing;Guarding;Sharp;Shooting Pain Intervention(s): Monitored during session;Limited activity within patient's tolerance;Premedicated before session  VSS           PT Goals (current goals can now be found in the  care plan section) Acute Rehab PT Goals Patient Stated Goal: Walk without pain PT Goal Formulation: With patient Time For Goal Achievement: 10/12/16 Potential to Achieve Goals: Good Progress towards PT goals: Progressing toward goals    Frequency    Min 5X/week      PT Plan Current plan remains appropriate       AM-PAC PT "6  Clicks" Daily Activity  Outcome Measure  Difficulty turning over in bed (including adjusting bedclothes, sheets and blankets)?: Total Difficulty moving from lying on back to sitting on the side of the bed? : Total Difficulty sitting down on and standing up from a chair with arms (e.g., wheelchair, bedside commode, etc,.)?: Total Help needed moving to and from a bed to chair (including a wheelchair)?: A Lot Help needed walking in hospital room?: A Lot Help needed climbing 3-5 steps with a railing? : Total 6 Click Score: 8    End of Session Equipment Utilized During Treatment: Gait belt;Other (comment) (abduction brace) Activity Tolerance: Patient limited by pain Patient left: in chair;with call bell/phone within reach Nurse Communication: Patient requests pain meds;Mobility status;Weight bearing status;Precautions PT Visit Diagnosis: Unsteadiness on feet (R26.81);Other abnormalities of gait and mobility (R26.89);Muscle weakness (generalized) (M62.81);History of falling (Z91.81);Difficulty in walking, not elsewhere classified (R26.2);Pain Pain - Right/Left: Right Pain - part of body: Hip     Time: 1610-9604 PT Time Calculation (min) (ACUTE ONLY): 27 min  Charges:  $Gait Training: 8-22 mins $Therapeutic Activity: 8-22 mins                    G Codes:       Russell Mizuno B. Russell Steele PT, DPT Acute Rehabilitation  [phone] Pager [phone]     Russell Steele 09/29/2016, 5:01 PM

## 2016-09-29 NOTE — Progress Notes (Signed)
Physical therapy continues to feel pt would benefit form an inpt rehab admission prior to d/c home. Order a rehab consult if you agree. Please call me with any questions. [phone]

## 2016-09-29 NOTE — Discharge Summary (Signed)
Physician Discharge Summary  Patient ID: Russell Steele MRN: [id number] DOB/AGE: 59/10/1955 [age] y.o.  Admit date: 09/26/2016 Discharge date: 09/30/2016  Admission Diagnoses:  Periprosthetic fracture around internal prosthetic right hip joint [facility](HCC)  Discharge Diagnoses:  Principal Problem:   Periprosthetic fracture around internal prosthetic right hip joint (HCC) Active Problems:   Periprosthetic fracture around internal prosthetic hip joint, initial encounter   Adjustment disorder with mixed anxiety and depressed mood   Tobacco abuse   OSA (obstructive sleep apnea)   History of right hip replacement   Post-operative pain   Acute blood loss anemia   Stage 3 chronic kidney disease   Past Medical History:  Diagnosis Date  . Anxiety   . Arthritis   . Depression   . History of kidney stones   . Sleep apnea     Surgeries: Procedure(s): OPEN REDUCTION INTERNAL FIXATION (ORIF) PERIPROSTHETIC FRACTURE TOTAL HIP ARTHROPLASTY revision of femoral component on 09/27/2016   Consultants (if any):   Discharged Condition: Improved  Hospital Course: Russell CampVictor Garry is an 61 y.o. male who was admitted 09/26/2016 with a diagnosis of Periprosthetic fracture around internal prosthetic right hip joint (HCC) and went to the operating room on 09/27/2016 and underwent the above named procedures.  He received 1 unit packed red blood cells intraoperatively.  He was given perioperative antibiotics:  Anti-infectives    Start     Dose/Rate Route Frequency Ordered Stop   09/27/16 1730  ceFAZolin (ANCEF) IVPB 2g/100 mL premix     2 g 200 mL/hr over 30 Minutes Intravenous Every 6 hours 09/27/16 1438 09/28/16 0154   09/27/16 0715  ceFAZolin (ANCEF) IVPB 2g/100 mL premix  Status:  Discontinued     2 g 200 mL/hr over 30 Minutes Intravenous On call to O.R. 09/26/16 1941 09/27/16 1421    .  He was given sequential compression devices, early mobilization, and apixaban for DVT prophylaxis.  He benefited  maximally from the hospital stay and there were no complications.    Recent vital signs:  Vitals:   09/29/16 1900 09/30/16 0514  BP: 138/89 137/67  Pulse: 88 89  Resp: 16 18  Temp: 99.1 F (37.3 C) 99.1 F (37.3 C)    Recent laboratory studies:  Lab Results  Component Value Date   HGB 8.2 (L) 10/01/2016   HGB 8.5 (L) 09/30/2016   HGB 9.2 (L) 09/29/2016   Lab Results  Component Value Date   WBC 7.2 10/01/2016   PLT 246 10/01/2016   Lab Results  Component Value Date   INR 1.01 09/26/2016   Lab Results  Component Value Date   NA 137 10/01/2016   K 4.3 10/01/2016   CL 98 (L) 10/01/2016   CO2 32 10/01/2016   BUN 9 10/01/2016   CREATININE 0.93 10/01/2016   GLUCOSE 101 (H) 10/01/2016    Discharge Medications:   Allergies as of 09/30/2016      Reactions   Bee Venom Anaphylaxis      Medication List    TAKE these medications   acetaminophen 500 MG tablet Commonly known as:  TYLENOL Take 1,000 mg by mouth every 6 (six) hours as needed for mild pain.   albuterol 108 (90 Base) MCG/ACT inhaler Commonly known as:  PROVENTIL HFA;VENTOLIN HFA Inhale 2 puffs into the lungs every 6 (six) hours as needed for wheezing or shortness of breath.   apixaban 2.5 MG Tabs tablet Commonly known as:  ELIQUIS Take 1 tablet (2.5 mg total) by mouth 2 (two) times daily.  aspirin 81 MG chewable tablet Chew 1 tablet (81 mg total) by mouth daily. What changed:  when to take this   busPIRone 15 MG tablet Commonly known as:  BUSPAR Take 15 mg by mouth 4 (four) times daily.   diphenhydrAMINE 25 mg capsule Commonly known as:  BENADRYL Take 50 mg by mouth at bedtime.   docusate sodium 100 MG capsule Commonly known as:  COLACE Take 1 capsule (100 mg total) by mouth 2 (two) times daily.   FLUoxetine 20 MG capsule Commonly known as:  PROZAC Take 20 mg by mouth daily.   gabapentin 300 MG capsule Commonly known as:  NEURONTIN Take 600 mg by mouth 3 (three) times daily.    multivitamin with minerals Tabs tablet Take 1 tablet by mouth daily.   ondansetron 4 MG tablet Commonly known as:  ZOFRAN Take 1 tablet (4 mg total) by mouth every 6 (six) hours as needed for nausea.   oxyCODONE 5 MG immediate release tablet Commonly known as:  Oxy IR/ROXICODONE Take 2-3 tablets (10-15 mg total) by mouth every 4 (four) hours as needed for breakthrough pain ((for MODERATE breakthrough pain)). What changed:  how much to take  when to take this  reasons to take this   oxymetazoline 0.05 % nasal spray Commonly known as:  AFRIN Place 1 spray into both nostrils 2 (two) times daily as needed for congestion.   senna 8.6 MG Tabs tablet Commonly known as:  SENOKOT Take 2 tablets (17.2 mg total) by mouth at bedtime.   simvastatin 20 MG tablet Commonly known as:  ZOCOR Take 20 mg by mouth at bedtime.   tamsulosin 0.4 MG Caps capsule Commonly known as:  FLOMAX Take 0.4 mg by mouth at bedtime.       Diagnostic Studies: Pelvis Portable  Result Date: 09/27/2016 CLINICAL DATA:  Postop check EXAM: PORTABLE PELVIS 1-2 VIEWS COMPARISON:  Intraoperative fluoroscopic spot films from earlier same day and CT right hip dated 09/24/2016 FINDINGS: Status post revision arthroplasty of the right hip with placement of a long stem femoral component and overlying cerclage wires. Hardware appears intact and appropriately positioned. Surrounding osseous structures appear intact and anatomic in alignment. IMPRESSION: Status post revision arthroplasty of the right hip. Hardware appears intact and appropriately positioned. No evidence of surgical complicating feature. Electronically Signed   By: Bary Richard M.D.   On: 09/27/2016 13:06   Ct Hip Right Wo Contrast  Result Date: 09/25/2016 CLINICAL DATA:  Right hip joint replacement surgery on 08/10/2016. The patient fell on 09/19/2016. In EXAM: CT OF THE RIGHT HIP WITHOUT CONTRAST TECHNIQUE: Multidetector CT imaging of the right hip was  performed according to the standard protocol. Multiplanar CT image reconstructions were also generated. COMPARISON:  Radiographs dated 08/10/2016 FINDINGS: Bones/Joint/Cartilage There is a longitudinal fracture of the proximal right femur extending 15 cm distally from the lateral aspect of the femoral shaft just below the right greater trochanter. There is no displacement. There is a subtle fracture of the anterior medial aspect of the acetabulum best seen on images 25 through 27 of series 2. The components of the total hip prosthesis appear in good position with no evidence of loosening. There is a fluid collection along the surgical track consistent with a small hematoma. IMPRESSION: 1. Longitudinal fracture of the proximal right femoral shaft along the stem of the femoral component of the total hip prosthesis. 2. Small focal fracture of the right acetabulum. Electronically Signed   By: Francene Boyers M.D.  On: 09/25/2016 08:35   Dg C-arm 61-120 Min  Result Date: 09/27/2016 CLINICAL DATA:  Right periprosthetic femur fracture. EXAM: DG C-ARM 61-120 MIN; OPERATIVE RIGHT HIP WITH PELVIS COMPARISON:  08/10/2016 FINDINGS: Status post revision arthroplasty of the right hip with placement of a long stem femoral component with retention cerclage wires. Fracture fragments are in anatomic alignment. IMPRESSION: 1. Status post right hip revision arthroplasty for periprosthetic fracture. Electronically Signed   By: Signa Kell M.D.   On: 09/27/2016 11:30   Dg Hip Operative Unilat W Or W/o Pelvis Right  Result Date: 09/27/2016 CLINICAL DATA:  Right periprosthetic femur fracture. EXAM: DG C-ARM 61-120 MIN; OPERATIVE RIGHT HIP WITH PELVIS COMPARISON:  08/10/2016 FINDINGS: Status post revision arthroplasty of the right hip with placement of a long stem femoral component with retention cerclage wires. Fracture fragments are in anatomic alignment. IMPRESSION: 1. Status post right hip revision arthroplasty for  periprosthetic fracture. Electronically Signed   By: Signa Kell M.D.   On: 09/27/2016 11:30   Dg Femur [place] 2 Views Right  Result Date: 09/27/2016 CLINICAL DATA:  Periprosthetic femur fracture. Status post revision. EXAM: RIGHT FEMUR PORTABLE 2 VIEW COMPARISON:  Multiple priors. FINDINGS: Status post revision of RIGHT total hip replacement, augmented with cable fixation. Satisfactory position and alignment. Surgical drain good position. IMPRESSION: As above. Electronically Signed   By: Elsie Stain M.D.   On: 09/27/2016 13:31    Disposition: IP Rehab  Discharge Instructions    Call MD / Call 911    Complete by:  As directed    If you experience chest pain or shortness of breath, CALL 911 and be transported to the hospital emergency room.  If you develope a fever above 101 F, pus (white drainage) or increased drainage or redness at the wound, or calf pain, call your surgeon's office.   Constipation Prevention    Complete by:  As directed    Drink plenty of fluids.  Prune juice may be helpful.  You may use a stool softener, such as Colace (over the counter) 100 mg twice a day.  Use MiraLax (over the counter) for constipation as needed.   Diet - low sodium heart healthy    Complete by:  As directed    Discharge instructions    Complete by:  As directed    Touch down weight bearing right leg Wear hip brace at all times except for showers   Driving restrictions    Complete by:  As directed    No driving for 6 weeks   Follow the hip precautions as taught in Physical Therapy    Complete by:  As directed    Increase activity slowly as tolerated    Complete by:  As directed    Lifting restrictions    Complete by:  As directed    No lifting for 12 weeks   TED hose    Complete by:  As directed    Use stockings (TED hose) for 2 weeks on both leg(s).  You may remove them at night for sleeping.      Follow-up Information    Chanler Mendonca, Arlys John, MD. Schedule an appointment as soon as  possible for a visit in 2 weeks.   Specialty:  Orthopedic Surgery Why:  For suture removal, For wound re-check Contact information: [address] [place] [address] [phone]            Signed: Garnet Koyanagi 10/02/2016, 9:34 AM

## 2016-09-29 NOTE — Progress Notes (Signed)
   Subjective:  Patient reports pain as mild to moderate.  C/o of R hip pain with mobilization.  Objective:   VITALS:   Vitals:   09/27/16 2140 09/28/16 0300 09/28/16 1635 09/28/16 2242  BP: 130/78 113/61 136/84 138/72  Pulse: 81 91 91 96  Resp: 16 17    Temp: 99 F (37.2 C) 98.7 F (37.1 C) 99.2 F (37.3 C) 100 F (37.8 C)  TempSrc: Oral Oral Oral Oral  SpO2: 98% 97% 99% 98%   NAD ABD soft Sensation intact distally Intact pulses distally Dorsiflexion/Plantar flexion intact Incision: dressing C/D/I Compartment soft Hip abduction brace in place   Lab Results  Component Value Date   WBC 8.3 09/29/2016   HGB 9.2 (L) 09/29/2016   HCT 28.9 (L) 09/29/2016   MCV 98.0 09/29/2016   PLT 220 09/29/2016   BMET    Component Value Date/Time   NA 134 (L) 09/29/2016 0538   K 4.1 09/29/2016 0538   CL 96 (L) 09/29/2016 0538   CO2 29 09/29/2016 0538   GLUCOSE 114 (H) 09/29/2016 0538   BUN 12 09/29/2016 0538   CREATININE 1.25 (H) 09/29/2016 0538   CALCIUM 8.8 (L) 09/29/2016 0538   GFRNONAA >60 09/29/2016 0538   GFRAA >60 09/29/2016 0538     Assessment/Plan: 2 Days Post-Op   Principal Problem:   Periprosthetic fracture around internal prosthetic right hip joint (HCC) Active Problems:   Periprosthetic fracture around internal prosthetic hip joint, initial encounter   TDWB RLE with walker Posterior hip precautions Hip abduction brace at all times DVT ppx: apixaban, SCDs, TEDs PO pain control PT/OT Dispo: D/C home tomorrow with HHPT   Michille Mcelrath, Cloyde ReamsBrian James 09/29/2016, 7:52 AM   Samson FredericBrian Javeion Cannedy, MD Cell [phone]

## 2016-09-30 ENCOUNTER — Inpatient Hospital Stay (HOSPITAL_COMMUNITY)
Admission: RE | Admit: 2016-09-30 | Discharge: 2016-10-06 | DRG: 560 | Disposition: A | Discharge status: Home Health | Payer: Non-veteran care | Source: Intra-hospital | Attending: Physical Medicine & Rehabilitation | Admitting: Physical Medicine & Rehabilitation

## 2016-09-30 ENCOUNTER — Encounter (HOSPITAL_COMMUNITY): Payer: Self-pay | Admitting: Orthopedic Surgery

## 2016-09-30 DIAGNOSIS — F419 Anxiety disorder, unspecified: Secondary | ICD-10-CM | POA: Diagnosis present

## 2016-09-30 DIAGNOSIS — M545 Low back pain: Secondary | ICD-10-CM | POA: Diagnosis not present

## 2016-09-30 DIAGNOSIS — K5900 Constipation, unspecified: Secondary | ICD-10-CM | POA: Diagnosis present

## 2016-09-30 DIAGNOSIS — F4323 Adjustment disorder with mixed anxiety and depressed mood: Secondary | ICD-10-CM

## 2016-09-30 DIAGNOSIS — Z720 Tobacco use: Secondary | ICD-10-CM

## 2016-09-30 DIAGNOSIS — N183 Chronic kidney disease, stage 3 unspecified: Secondary | ICD-10-CM

## 2016-09-30 DIAGNOSIS — Z89429 Acquired absence of other toe(s), unspecified side: Secondary | ICD-10-CM

## 2016-09-30 DIAGNOSIS — N400 Enlarged prostate without lower urinary tract symptoms: Secondary | ICD-10-CM | POA: Diagnosis present

## 2016-09-30 DIAGNOSIS — M549 Dorsalgia, unspecified: Secondary | ICD-10-CM | POA: Diagnosis present

## 2016-09-30 DIAGNOSIS — S7291XD Unspecified fracture of right femur, subsequent encounter for closed fracture with routine healing: Secondary | ICD-10-CM | POA: Diagnosis not present

## 2016-09-30 DIAGNOSIS — G8918 Other acute postprocedural pain: Secondary | ICD-10-CM | POA: Diagnosis not present

## 2016-09-30 DIAGNOSIS — E785 Hyperlipidemia, unspecified: Secondary | ICD-10-CM | POA: Diagnosis present

## 2016-09-30 DIAGNOSIS — E46 Unspecified protein-calorie malnutrition: Secondary | ICD-10-CM

## 2016-09-30 DIAGNOSIS — Z96641 Presence of right artificial hip joint: Secondary | ICD-10-CM

## 2016-09-30 DIAGNOSIS — W19XXXD Unspecified fall, subsequent encounter: Secondary | ICD-10-CM | POA: Diagnosis present

## 2016-09-30 DIAGNOSIS — Z716 Tobacco abuse counseling: Secondary | ICD-10-CM

## 2016-09-30 DIAGNOSIS — M9701XS Periprosthetic fracture around internal prosthetic right hip joint, sequela: Secondary | ICD-10-CM

## 2016-09-30 DIAGNOSIS — G4730 Sleep apnea, unspecified: Secondary | ICD-10-CM | POA: Diagnosis present

## 2016-09-30 DIAGNOSIS — M9701XD Periprosthetic fracture around internal prosthetic right hip joint, subsequent encounter: Principal | ICD-10-CM

## 2016-09-30 DIAGNOSIS — D62 Acute posthemorrhagic anemia: Secondary | ICD-10-CM | POA: Diagnosis present

## 2016-09-30 DIAGNOSIS — G8929 Other chronic pain: Secondary | ICD-10-CM | POA: Diagnosis present

## 2016-09-30 DIAGNOSIS — F17210 Nicotine dependence, cigarettes, uncomplicated: Secondary | ICD-10-CM | POA: Diagnosis present

## 2016-09-30 DIAGNOSIS — E8809 Other disorders of plasma-protein metabolism, not elsewhere classified: Secondary | ICD-10-CM | POA: Diagnosis present

## 2016-09-30 DIAGNOSIS — K5903 Drug induced constipation: Secondary | ICD-10-CM

## 2016-09-30 DIAGNOSIS — Z96649 Presence of unspecified artificial hip joint: Secondary | ICD-10-CM

## 2016-09-30 DIAGNOSIS — G4733 Obstructive sleep apnea (adult) (pediatric): Secondary | ICD-10-CM

## 2016-09-30 DIAGNOSIS — M978XXA Periprosthetic fracture around other internal prosthetic joint, initial encounter: Secondary | ICD-10-CM

## 2016-09-30 DIAGNOSIS — R739 Hyperglycemia, unspecified: Secondary | ICD-10-CM | POA: Diagnosis present

## 2016-09-30 DIAGNOSIS — M7989 Other specified soft tissue disorders: Secondary | ICD-10-CM | POA: Diagnosis not present

## 2016-09-30 DIAGNOSIS — M9702XS Periprosthetic fracture around internal prosthetic left hip joint, sequela: Secondary | ICD-10-CM | POA: Diagnosis not present

## 2016-09-30 DIAGNOSIS — Z79899 Other long term (current) drug therapy: Secondary | ICD-10-CM

## 2016-09-30 DIAGNOSIS — Z91030 Bee allergy status: Secondary | ICD-10-CM | POA: Diagnosis not present

## 2016-09-30 DIAGNOSIS — F329 Major depressive disorder, single episode, unspecified: Secondary | ICD-10-CM | POA: Diagnosis present

## 2016-09-30 LAB — BPAM RBC
Blood Product Expiration Date: 201806202359
Blood Product Expiration Date: 201806202359
ISSUE DATE / TIME: 201805270922
ISSUE DATE / TIME: 201805270922
UNIT TYPE AND RH: 5100
Unit Type and Rh: 5100

## 2016-09-30 LAB — TYPE AND SCREEN
ABO/RH(D): O POS
ANTIBODY SCREEN: NEGATIVE
UNIT DIVISION: 0
Unit division: 0

## 2016-09-30 LAB — CBC
HCT: 26.6 % — ABNORMAL LOW (ref 39.0–52.0)
Hemoglobin: 8.5 g/dL — ABNORMAL LOW (ref 13.0–17.0)
MCH: 30.9 pg (ref 26.0–34.0)
MCHC: 32 g/dL (ref 30.0–36.0)
MCV: 96.7 fL (ref 78.0–100.0)
Platelets: 225 10*3/uL (ref 150–400)
RBC: 2.75 MIL/uL — ABNORMAL LOW (ref 4.22–5.81)
RDW: 14.1 % (ref 11.5–15.5)
WBC: 8 10*3/uL (ref 4.0–10.5)

## 2016-09-30 MED ORDER — APIXABAN 2.5 MG PO TABS
2.5000 mg | ORAL_TABLET | Freq: Two times a day (BID) | ORAL | Status: DC
  Administered 2016-09-30 – 2016-10-06 (×12): 2.5 mg via ORAL
  Filled 2016-09-30 (×12): qty 1

## 2016-09-30 MED ORDER — ONDANSETRON HCL 4 MG/2ML IJ SOLN: 4.0000 mg | Freq: Four times a day (QID) | INTRAMUSCULAR | Status: DC | PRN

## 2016-09-30 MED ORDER — ONDANSETRON HCL 4 MG PO TABS: 4.0000 mg | ORAL_TABLET | Freq: Four times a day (QID) | ORAL | Status: DC | PRN

## 2016-09-30 MED ORDER — GABAPENTIN 300 MG PO CAPS
600.0000 mg | ORAL_CAPSULE | Freq: Three times a day (TID) | ORAL | Status: DC
  Administered 2016-09-30 – 2016-10-06 (×18): 600 mg via ORAL
  Filled 2016-09-30 (×8): qty 2
  Filled 2016-09-30: qty 6
  Filled 2016-09-30 (×6): qty 2
  Filled 2016-09-30: qty 6
  Filled 2016-09-30 (×3): qty 2

## 2016-09-30 MED ORDER — FLUOXETINE HCL 20 MG PO CAPS
20.0000 mg | ORAL_CAPSULE | Freq: Every day | ORAL | Status: DC
Start: 2016-10-01 — End: 2016-10-06
  Administered 2016-10-01 – 2016-10-06 (×6): 20 mg via ORAL
  Filled 2016-09-30 (×6): qty 1

## 2016-09-30 MED ORDER — HYDROCODONE-ACETAMINOPHEN 5-325 MG PO TABS
1.0000 | ORAL_TABLET | Freq: Four times a day (QID) | ORAL | Status: DC | PRN
Start: 2016-09-30 — End: 2016-10-01
  Administered 2016-09-30 – 2016-10-01 (×2): 2 via ORAL
  Filled 2016-09-30 (×2): qty 2

## 2016-09-30 MED ORDER — SORBITOL 70 % SOLN: 30.0000 mL | Freq: Every day | Status: DC | PRN

## 2016-09-30 MED ORDER — ADULT MULTIVITAMIN W/MINERALS CH
1.0000 | ORAL_TABLET | Freq: Every day | ORAL | Status: DC
  Administered 2016-10-01 – 2016-10-06 (×6): 1 via ORAL
  Filled 2016-09-30 (×6): qty 1

## 2016-09-30 MED ORDER — BUSPIRONE HCL 15 MG PO TABS
15.0000 mg | ORAL_TABLET | Freq: Four times a day (QID) | ORAL | Status: DC
  Administered 2016-09-30 – 2016-10-06 (×24): 15 mg via ORAL
  Filled 2016-09-30 (×24): qty 1

## 2016-09-30 MED ORDER — TAMSULOSIN HCL 0.4 MG PO CAPS
0.4000 mg | ORAL_CAPSULE | Freq: Every day | ORAL | Status: DC
  Administered 2016-09-30 – 2016-10-05 (×6): 0.4 mg via ORAL
  Filled 2016-09-30 (×6): qty 1

## 2016-09-30 MED ORDER — DOCUSATE SODIUM 100 MG PO CAPS
100.0000 mg | ORAL_CAPSULE | Freq: Two times a day (BID) | ORAL | Status: DC
  Administered 2016-09-30 – 2016-10-06 (×12): 100 mg via ORAL
  Filled 2016-09-30 (×12): qty 1

## 2016-09-30 MED ORDER — POLYETHYLENE GLYCOL 3350 17 G PO PACK: 17.0000 g | PACK | Freq: Every day | ORAL | Status: DC | PRN

## 2016-09-30 MED ORDER — ACETAMINOPHEN 650 MG RE SUPP: 650.0000 mg | Freq: Four times a day (QID) | RECTAL | Status: DC | PRN

## 2016-09-30 MED ORDER — ALBUTEROL SULFATE (2.5 MG/3ML) 0.083% IN NEBU: 2.5000 mg | INHALATION_SOLUTION | Freq: Four times a day (QID) | RESPIRATORY_TRACT | Status: DC | PRN

## 2016-09-30 MED ORDER — SIMVASTATIN 20 MG PO TABS
20.0000 mg | ORAL_TABLET | Freq: Every day | ORAL | Status: DC
  Administered 2016-09-30 – 2016-10-05 (×6): 20 mg via ORAL
  Filled 2016-09-30 (×6): qty 1

## 2016-09-30 MED ORDER — SENNA 8.6 MG PO TABS
1.0000 | ORAL_TABLET | Freq: Two times a day (BID) | ORAL | Status: DC
  Administered 2016-09-30 – 2016-10-06 (×12): 8.6 mg via ORAL
  Filled 2016-09-30 (×12): qty 1

## 2016-09-30 MED ORDER — ACETAMINOPHEN 325 MG PO TABS
650.0000 mg | ORAL_TABLET | Freq: Four times a day (QID) | ORAL | Status: DC | PRN
  Administered 2016-10-05: 650 mg via ORAL
  Filled 2016-09-30: qty 2

## 2016-09-30 NOTE — Care Management Important Message (Signed)
Important Message  Patient Details  Name: Russell Steele MRN: [id number] Date of Birth: 05/08/1955   Medicare Important Message Given:  Yes    Russell Steele 09/30/2016, 2:47 PM

## 2016-09-30 NOTE — PMR Pre-admission (Signed)
PMR Admission Coordinator Pre-Admission Assessment  Patient: Russell Steele is an 61 y.o., male MRN: [id number] DOB: Jan 02, 1956 Height: 6\' 3"  (190.5 cm) Weight: 123.5 kg (272 lb 4.8 oz) (patient wearing brace that must be maintained)              Insurance Information HMO:     PPO:      PCP:      IPA:      80/20: yes     OTHER:  No HMO PRIMARY: Medicare part A only      Policy#: [id number]      Subscriber: pt Benefits:  Phone #: passport one online     Name: 09/30/2016 Eff. Date: 01/03/2015     Deduct: $1340      Out of Pocket Max: none      Life Max: none CIR: 100%      SNF: 20 full days Outpatient: Medicare a only     Co-Pay:  Home Health: Medicare A only      Co-Pay:  DME: Medicare a only     Co-Pay:  Providers: pt choice  SECONDARY: Generic [facility]      Policy#: [id number]      Subscriber: pt  Patient states he has been workers compensation since injury 2010. [facility] for his original THA. I explained to pt that Medicare part A would be covering his inpt rehab, not the [place]. He was in agreement. He states workers compensation unaware of his hip issues.  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 Presented 09/27/2016 after a fall and inability to bear weight on the right leg. X-rays and imaging revealed right hip periprosthetic fracture. Underwent ORIF 09/27/2016 per Dr. Linna Caprice. Hospital course pain management. Partial weightbearing right lower extremity of 30  pounds. Fitted with hip abduction brace as well as posterior hip precautions. Acute blood loss anemia 8.5 and monitor. Placed on Eliquisfor DVT prophylaxis.   Past Medical History  Past Medical History:  Diagnosis Date  . Anxiety   . Arthritis   . Depression   . History of kidney stones   . Sleep apnea     Family History  family history is not on file.  Prior Rehab/Hospitalizations:  Has the patient had major surgery during 100 days prior to admission? Yes  Current Medications   Current Facility-Administered Medications:  .  acetaminophen (TYLENOL) tablet 650 mg, 650 mg, Oral, Q6H PRN **OR** acetaminophen (TYLENOL) suppository 650 mg, 650 mg, Rectal, Q6H PRN, Swinteck, Arlys John, MD .  albuterol (PROVENTIL) (2.5 MG/3ML) 0.083% nebulizer solution 2.5 mg, 2.5 mg, Nebulization, Q6H PRN, Allena Katz, RPH .  apixaban (ELIQUIS) tablet 2.5 mg, 2.5 mg, Oral, BID, Swinteck, Arlys John, MD, 2.5 mg at 09/30/16 1036 .  busPIRone (BUSPAR) tablet 15 mg, 15 mg, Oral, QID, Swinteck, Arlys John, MD, 15 mg at 09/30/16 1036 .  diphenhydrAMINE (BENADRYL) capsule 50 mg, 50 mg, Oral, QHS, Swinteck, Arlys John, MD, 50 mg at 09/29/16 2122 .  docusate sodium (COLACE)  capsule 100 mg, 100 mg, Oral, BID, Swinteck, Arlys John, MD, 100 mg at 09/30/16 1036 .  FLUoxetine (PROZAC) capsule 20 mg, 20 mg, Oral, Daily, Swinteck, Arlys John, MD, 20 mg at 09/30/16 1036 .  gabapentin (NEURONTIN) capsule 600 mg, 600 mg, Oral, TID, Swinteck, Arlys John, MD, 600 mg at 09/30/16 1036 .  HYDROcodone-acetaminophen (NORCO/VICODIN) 5-325 MG per tablet 1-2 tablet, 1-2 tablet, Oral, Q6H PRN, Yolonda Kida, MD, 2 tablet at 09/30/16 1054 .  HYDROmorphone (DILAUDID) injection 0.5-1 mg, 0.5-1 mg, Intravenous, Q2H PRN, Samson Frederic, MD, 1 mg at 09/28/16 1802 .  menthol-cetylpyridinium (CEPACOL) lozenge 3 mg, 1 lozenge, Oral, PRN **OR** phenol (CHLORASEPTIC) mouth spray 1 spray, 1 spray, Mouth/Throat, PRN, Swinteck, Arlys John, MD .  metoCLOPramide (REGLAN)  tablet 5-10 mg, 5-10 mg, Oral, Q8H PRN **OR** metoCLOPramide (REGLAN) injection 5-10 mg, 5-10 mg, Intravenous, Q8H PRN, Swinteck, Arlys John, MD .  multivitamin with minerals tablet 1 tablet, 1 tablet, Oral, Daily, Swinteck, Arlys John, MD, 1 tablet at 09/30/16 1036 .  ondansetron (ZOFRAN) tablet 4 mg, 4 mg, Oral, Q6H PRN **OR** ondansetron (ZOFRAN) injection 4 mg, 4 mg, Intravenous, Q6H PRN, Swinteck, Arlys John, MD .  oxyCODONE (Oxy IR/ROXICODONE) immediate release tablet 10-15 mg, 10-15 mg, Oral, Q4H PRN, Samson Frederic, MD, 15 mg at 09/30/16 0744 .  polyethylene glycol (MIRALAX / GLYCOLAX) packet 17 g, 17 g, Oral, Daily PRN, Swinteck, Arlys John, MD .  senna (SENOKOT) tablet 8.6 mg, 1 tablet, Oral, BID, Swinteck, Arlys John, MD, 8.6 mg at 09/30/16 1036 .  simvastatin (ZOCOR) tablet 20 mg, 20 mg, Oral, QHS, Swinteck, Arlys John, MD, 20 mg at 09/29/16 2123 .  tamsulosin (FLOMAX) capsule 0.4 mg, 0.4 mg, Oral, QHS, Swinteck, Arlys John, MD, 0.4 mg at 09/29/16 2122  Patients Current Diet: Diet regular Room service appropriate? Yes; Fluid consistency: Thin Diet - low sodium heart healthy  Precautions / Restrictions Precautions Precautions: Posterior Hip, Fall Precaution Booklet Issued: Yes (comment) Precaution Comments: Pt able to recall 2/3 hip precautions after reviewing during session. Pt requires VCs to maintain precautions throughout session. 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 None of the above  Prior Functional Level Prior Function Level of Independence: Independent Comments: Pt was doing well after prior R THA. 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  Self Care: Did the patient need help bathing, dressing, using the toilet or eating?  Independent  Indoor Mobility: Did the patient need assistance with walking from room to room (with or without device)? Independent  Stairs: Did the patient need assistance with internal or external stairs (with or without device)? Independent  Functional Cognition: Did the patient need help planning regular tasks such as shopping or remembering to take medications? Independent  Current Functional Level Cognition  Overall Cognitive Status: Impaired/Different from baseline Orientation Level: Oriented X4 Following Commands: Follows one step commands  with increased time Safety/Judgement: Decreased awareness of safety General Comments: Required cues throughout session to maintain post hip precautions during ADLs and funcitonal mobility    Extremity Assessment (includes Sensation/Coordination)  Upper Extremity Assessment: Overall WFL for tasks assessed  Lower Extremity Assessment: Defer to PT evaluation RLE Deficits / Details: ROM and strength of hip and knee could not be assessed due to surgical pain RLE: Unable to fully assess due to pain, Unable to fully assess due to immobilization RLE Coordination: decreased gross motor, decreased fine motor    ADLs  Overall ADL's : Needs assistance/impaired Eating/Feeding: Set up, Sitting Grooming: Set up, Sitting Upper Body Bathing: Set up, Sitting Lower Body Bathing: Sit to/from stand, Moderate assistance Upper Body Dressing : Set up,  Sitting Lower Body Dressing: Sit to/from stand, Moderate assistance Toilet Transfer: Maximal assistance, +2 for physical assistance, +2 for safety/equipment, Stand-pivot, RW (Simulated to recliner) Tub/ Shower Transfer: Minimal assistance, +2 for safety/equipment, Tub transfer, Tub bench, Cueing for sequencing, Cueing for safety Functional mobility during ADLs: Maximal assistance, Rolling walker General ADL Comments: Pt very motivated and willing to participate in therapy. Pt requires Max A +2 for transfers to A with managing RLE due to significant pain. Pt Max A for LB ADLs    Mobility  Overal bed mobility: Needs Assistance Bed Mobility: Supine to Sit Supine to sit: Mod assist, HOB elevated General bed mobility comments: Pt used bedrails and required Min A for lowing R legs down to floor while maintaining hip precautions. Pt started to cross legs to utilize LLE to move RLE; required Mod VCs to maintain precautions during transition to EOB.     Transfers  Overall transfer level: Needs assistance Equipment used: Rolling walker (2 wheeled) Transfers: Sit to/from Stand Sit to Stand: From elevated surface, Min assist, +2 safety/equipment General transfer comment: Min A to power up from elevated surface. +2 for safety in standing. Required VCs to manage RLE and for hand placement    Ambulation / Gait / Stairs / Wheelchair Mobility  Ambulation/Gait Ambulation/Gait assistance: Mod assist Ambulation Distance (Feet): 30 Feet Assistive device: Rolling walker (2 wheeled) Gait Pattern/deviations: Shuffle, Antalgic, Step-to pattern, Trunk flexed (hop to pattern mostly to maintain PWB) General Gait Details: modA for stability with vc for maintaining weightbearing precautions Pt distance of ambulation in limited by UE strength  Gait velocity: slowed Gait velocity interpretation: Below normal speed for age/gender    Posture / Balance Balance Overall balance assessment: Needs  assistance Sitting-balance support: No upper extremity supported, Feet supported Sitting balance-Leahy Scale: Fair Standing balance support: Bilateral upper extremity supported Standing balance-Leahy Scale: Fair Standing balance comment: requires RW support     Special needs/care consideration BiPAP/CPAP yes pta CPM  N/a Continuous Drip IV  N/a Dialysis  N/a Life Vest  N/a Oxygen  N/a Special Bed  N/a Trach Size  N/a Wound Vac n/a Skin surgical incision Bowel mgmt: continent LBM 09/27/2016 Bladder mgmt: continent Diabetic mgmt n/a   Previous Home Environment Living Arrangements: Spouse/significant other  Lives With: Spouse Available Help at Discharge:  (niece helping with his wife since he was hospitalized) Type of Home: House Home Layout: One level Home Access: Ramped entrance Bathroom Shower/Tub: Tub/shower unit, Engineer, building services: Handicapped height Bathroom Accessibility: Yes How Accessible: Accessible via walker Home Care Services: No  Discharge Living Setting Plans for Discharge Living Setting: Patient's home, Lives with (comment) (spouse) Type of Home at Discharge: House Discharge Home Layout: One level Discharge Home Access: Ramped entrance Discharge Bathroom Shower/Tub:  Tub/shower unit, Curtain Discharge Bathroom Toilet: Handicapped height Discharge Bathroom Accessibility: Yes How Accessible: Accessible via walker Does the patient have any problems obtaining your medications?: No  Social/Family/Support Systems Patient Roles: Spouse, Caregiver Contact Information: Fleet ContrasRachel, niece and wife, Verlon AuLeslie Anticipated Caregiver: self and niece prn Anticipated Caregiver's Contact Information: see above Ability/Limitations of Caregiver: niece in pt's home as caregiver for pt's wife since he was hospitalized Caregiver Availability: Intermittent Discharge Plan Discussed with Primary Caregiver: Yes Is Caregiver In Agreement with Plan?: Yes Does Caregiver/Family have  Issues with Lodging/Transportation while Pt is in Rehab?: No   Patient's wife had a CVA 2017. [facility] and [facility] home after about 1 1/2 months. She currently is ambulatory and he provides her supervision with all adls and ambulation. She has very poor memory. Pt's niece did not live with them pta. She is assisting in the home with pt's wife since he was hospitalized. Pt's daughter is in [place].  Goals/Additional Needs Patient/Family Goal for Rehab: Mod I with PT and OT Expected length of stay: ELOS 16-19 days Pt/Family Agrees to Admission and willing to participate: Yes Program Orientation Provided & Reviewed with Pt/Caregiver Including Roles  & Responsibilities: Yes  Decrease burden of Care through IP rehab admission: n/a  Possible need for SNF placement upon discharge: not anticipated  Patient Condition: This patient's condition remains as documented in the consult dated 09/30/2016, in which the Rehabilitation Physician determined and documented that the patient's condition is appropriate for intensive rehabilitative care in an inpatient rehabilitation facility. Will admit to inpatient rehab today.  Preadmission Screen Completed By:  Clois DupesBoyette, Cohan Stipes Godwin, 09/30/2016 1:58 PM ______________________________________________________________________   Discussed status with Dr. Allena KatzPatel on 09/30/2016 at  1358 and received telephone approval for admission today.  Admission Coordinator:  Clois DupesBoyette, Mar Walmer Godwin, time [phone] Date 09/30/2016

## 2016-09-30 NOTE — Progress Notes (Signed)
Physical Therapy Treatment Patient Details Name: Russell Steele MRN: [id number] DOB: 10/20/1955 Today's Date: 09/30/2016    History of Present Illness 61 y.o. male who complains of right hip pain after a fall about 2 weeks ago. 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 past medical history of Anxiety; Arthritis; Depression; History of kidney stones; and Sleep apnea. has a past surgical history that includes Back surgery; Knee arthroscopy; Amputation toe    PT Comments    Pt has poor functional memory of his hip precautions with prompting he is able to recall them but then mobilizes in ways that put his surgical repair at risk. Pt is making slow progress towards his goals and is limited by pain and fatigue in mobilization. Pt currently, modA for bed mobility, minAx2 for transfers, and modA for ambulation of 30 feet with RW. PT discussed pt status with physician and agreed to a consult from CIR to further intensive PT intervention to be able to ultimately safely discharge home.       Follow Up Recommendations  CIR;Supervision/Assistance - 24 hour     Equipment Recommendations  Other (comment) (to be determined at next venue)    Recommendations for Other Services Rehab consult     Precautions / Restrictions Precautions Precautions: Posterior Hip;Fall Precaution Booklet Issued: Yes (comment) Precaution Comments: Pt able to recall 2/3 hip precautions after reviewing during session. Pt requires VCs to maintain precautions throughout session. Referred to provided handout Required Braces or Orthoses: Other Brace/Splint (hip abduction brace) Other Brace/Splint: hip abduction brace Restrictions Weight Bearing Restrictions: No RLE Weight Bearing: Partial weight bearing RLE Partial Weight Bearing Percentage or Pounds: 30    Mobility  Bed Mobility Overal bed mobility: Needs Assistance Bed Mobility: Supine to Sit     Supine  to sit: Mod assist;HOB elevated     General bed mobility comments: Pt used bedrails and required Min A for lowing R legs down to floor while maintaining hip precautions. Pt started to cross legs to utilize LLE to move RLE; required Mod VCs to maintain precautions during transition to EOB.   Transfers Overall transfer level: Needs assistance Equipment used: Rolling walker (2 wheeled) Transfers: Sit to/from Stand Sit to Stand: From elevated surface;Min assist;+2 safety/equipment         General transfer comment: Min A to power up from elevated surface. +2 for safety in standing. Required VCs to manage RLE and for hand placement  Ambulation/Gait Ambulation/Gait assistance: Mod assist Ambulation Distance (Feet): 30 Feet Assistive device: Rolling walker (2 wheeled) Gait Pattern/deviations: Shuffle;Antalgic;Step-to pattern;Trunk flexed (hop to pattern mostly to maintain PWB) Gait velocity: slowed Gait velocity interpretation: Below normal speed for age/gender General Gait Details: modA for stability with vc for maintaining weightbearing precautions Pt distance of ambulation in limited by UE strength          Balance Overall balance assessment: Needs assistance Sitting-balance support: No upper extremity supported;Feet supported Sitting balance-Leahy Scale: Fair     Standing balance support: Bilateral upper extremity supported Standing balance-Leahy Scale: Fair Standing balance comment: requires RW support                             Cognition Arousal/Alertness: Awake/alert Behavior During Therapy: WFL for tasks assessed/performed Overall Cognitive Status: Impaired/Different from baseline Area of Impairment: Memory;Following commands;Problem solving;Safety/judgement  Memory: Decreased recall of precautions Following Commands: Follows one step commands with increased time Safety/Judgement: Decreased awareness of safety   Problem  Solving: Requires verbal cues;Requires tactile cues General Comments: Required cues throughout session to maintain post hip precautions during ADLs and funcitonal mobility      Exercises General Exercises - Lower Extremity Heel Slides:  (limited to maintain hip precaution)    General Comments General comments (skin integrity, edema, etc.): Pt exhibits poor functional memory of posterior hip precautions, able to verbalize back to PT but then proceeds to mobilize in ways that break the precautions like hooking L LE under the R ankle to aid in bed mobility and bending forward to use UE to move L LE       Pertinent Vitals/Pain Pain Assessment: 0-10 Pain Score: 8  Pain Location: R hip Pain Descriptors / Indicators: Constant;Discomfort;Grimacing;Guarding;Sharp;Shooting Pain Intervention(s): Monitored during session;Repositioned  VSS           PT Goals (current goals can now be found in the care plan section) Acute Rehab PT Goals Patient Stated Goal: Walk without pain PT Goal Formulation: With patient Time For Goal Achievement: 10/12/16 Potential to Achieve Goals: Good Progress towards PT goals: Progressing toward goals    Frequency    Min 5X/week      PT Plan Current plan remains appropriate    Co-evaluation   Reason for Co-Treatment: Complexity of the patient's impairments (multi-system involvement);For patient/therapist safety PT goals addressed during session: Mobility/safety with mobility OT goals addressed during session: ADL's and self-care      AM-PAC PT "6 Clicks" Daily Activity  Outcome Measure  Difficulty turning over in bed (including adjusting bedclothes, sheets and blankets)?: Total Difficulty moving from lying on back to sitting on the side of the bed? : Total Difficulty sitting down on and standing up from a chair with arms (e.g., wheelchair, bedside commode, etc,.)?: Total Help needed moving to and from a bed to chair (including a wheelchair)?: A  Little Help needed walking in hospital room?: A Little Help needed climbing 3-5 steps with a railing? : Total 6 Click Score: 10    End of Session Equipment Utilized During Treatment: Gait belt;Other (comment) (abduction brace) Activity Tolerance: Patient limited by pain;Patient limited by fatigue Patient left: in chair;with call bell/phone within reach Nurse Communication: Mobility status;Weight bearing status;Precautions PT Visit Diagnosis: Unsteadiness on feet (R26.81);Other abnormalities of gait and mobility (R26.89);History of falling (Z91.81);Muscle weakness (generalized) (M62.81);Pain;Difficulty in walking, not elsewhere classified (R26.2) Pain - Right/Left: Right Pain - part of body: Hip     Time: 4098-1191 PT Time Calculation (min) (ACUTE ONLY): 37 min  Charges:  $Gait Training: 8-22 mins                    G Codes:       Russell Steele B. Beverely Risen PT, DPT Acute Rehabilitation  [phone] Pager [phone]     Elon Alas Fleet 09/30/2016, 12:14 PM

## 2016-09-30 NOTE — Progress Notes (Signed)
[place] previously given to [place], [place],. Patient is going to room 12-MW

## 2016-09-30 NOTE — H&P (Signed)
Physical Medicine and Rehabilitation Admission H&P    Chief complaint: Hip pain  HPI: Russell Steele is a 61 y.o. 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 Patient lives with spouse. Independent prior to total hip replacement. One level home with ramped entrance. History taken from patient and chart review.  Wife has had a stroke in the past and is "useless" per pt. There is also a niece  in the home who can assist. Presented 09/27/2016 after a fall and inability to bear weight on the right leg. X-rays and imaging revealed right hip periprosthetic fracture. Underwent ORIF 09/27/2016 per Dr. Lyla Glassing. Hospital course pain management. Partial weightbearing right lower extremity of 30 pounds. Fitted with hip abduction brace as well as posterior hip precautions. Acute blood loss anemia 8.5 and monitor. Placed on Eliquis for DVT prophylaxis. Physical occupational therapy evaluations completed with recommendations of physical medicine rehabilitation consult. Patient was admitted for a comprehensive rehabilitation program  Review of Systems  Constitutional: Negative for chills and fever.  HENT: Negative for hearing loss.   Eyes: Negative for blurred vision and double vision.  Respiratory: Positive for shortness of breath.   Cardiovascular: Positive for leg swelling. Negative for chest pain and palpitations.  Gastrointestinal: Positive for constipation. Negative for nausea and vomiting.  Genitourinary: Positive for urgency. Negative for dysuria, flank pain and hematuria.  Musculoskeletal: Positive for falls, joint pain and myalgias.  Skin: Negative for rash.  Neurological: Negative for seizures.  Psychiatric/Behavioral: Positive for depression.       Anxiety  All other systems reviewed and are negative.  Past Medical History:  Diagnosis Date  . Anxiety   . Arthritis   . Depression   . History of  kidney stones   . Sleep apnea    Past Surgical History:  Procedure Laterality Date  . AMPUTATION TOE    . APPENDECTOMY    . BACK SURGERY    . DENTAL SURGERY    . KNEE ARTHROSCOPY    . ORIF FEMUR FRACTURE Right 09/26/2016  . TOTAL HIP ARTHROPLASTY Right 08/10/2016   Procedure: RIGHT TOTAL HIP ARTHROPLASTY ANTERIOR APPROACH;  Surgeon: Rod Can, MD;  Location: [facility];  Service: Orthopedics;  Laterality: Right;   History reviewed. No pertinent family history of hip fracture. Social History:  reports that he has been smoking Cigarettes.  He has been smoking about 0.50 packs per day. He has never used smokeless tobacco. He reports that he drinks about 1.8 oz of alcohol per week . He reports that he does not use drugs. Allergies:  Allergies  Allergen Reactions  . Bee Venom Anaphylaxis   Medications Prior to Admission  Medication Sig Dispense Refill  . acetaminophen (TYLENOL) 500 MG tablet Take 1,000 mg by mouth every 6 (six) hours as needed for mild pain.    Marland Kitchen albuterol (PROVENTIL HFA;VENTOLIN HFA) 108 (90 Base) MCG/ACT inhaler Inhale 2 puffs into the lungs every 6 (six) hours as needed for wheezing or shortness of breath.    . busPIRone (BUSPAR) 15 MG tablet Take 15 mg by mouth 4 (four) times daily.     . diphenhydrAMINE (BENADRYL) 25 mg capsule Take 50 mg by mouth at bedtime.     Marland Kitchen FLUoxetine (PROZAC) 20 MG capsule Take 20 mg by mouth daily.    Marland Kitchen gabapentin (NEURONTIN) 300 MG capsule Take 600 mg by mouth 3 (three) times daily.    . Multiple Vitamin (MULTIVITAMIN WITH  MINERALS) TABS tablet Take 1 tablet by mouth daily.    Marland Kitchen oxyCODONE (OXY IR/ROXICODONE) 5 MG immediate release tablet Take 3-4 tablets (15-20 mg total) by mouth every 3 (three) hours as needed for breakthrough pain. (Patient taking differently: Take 15 mg by mouth 4 (four) times daily. ) 90 tablet 0  . simvastatin (ZOCOR) 20 MG tablet Take 20 mg by mouth at bedtime.    . tamsulosin (FLOMAX) 0.4 MG CAPS capsule Take 0.4 mg by  mouth at bedtime.    . [DISCONTINUED] aspirin 81 MG chewable tablet Chew 1 tablet (81 mg total) by mouth 2 (two) times daily. 60 tablet 1  . docusate sodium (COLACE) 100 MG capsule Take 1 capsule (100 mg total) by mouth 2 (two) times daily. (Patient not taking: Reported on 09/26/2016) 60 capsule 1  . ondansetron (ZOFRAN) 4 MG tablet Take 1 tablet (4 mg total) by mouth every 6 (six) hours as needed for nausea. (Patient not taking: Reported on 09/26/2016) 20 tablet 0  . oxymetazoline (AFRIN) 0.05 % nasal spray Place 1 spray into both nostrils 2 (two) times daily as needed for congestion.    . senna (SENOKOT) 8.6 MG TABS tablet Take 2 tablets (17.2 mg total) by mouth at bedtime. (Patient not taking: Reported on 09/26/2016) 120 each 0    Home: Home Living Family/patient expects to be discharged to:: Unsure Living Arrangements: Spouse/significant other Available Help at Discharge: Family Type of Home: House Home Access: Ramped entrance Home Layout: One level Bathroom Shower/Tub: Tub/shower unit, Architectural technologist: Handicapped height Bathroom Accessibility: Yes Home Equipment: Grab bars - tub/shower, Environmental consultant - standard, Wheelchair - manual, Bedside commode (lift chair; BSC too small for his hips)   Functional History: Prior Function Level of Independence: Independent Comments: Pt was doing well after prior R THA. 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  Functional Status:  Mobility: Bed Mobility Overal bed mobility: Needs Assistance Bed Mobility: Supine to Sit Supine to sit: Mod assist, HOB elevated General bed mobility comments: Pt used bedrails and required Min A for lowing R legs down to floor while maintaining hip precautions. Pt started to cross legs to utilize LLE to move RLE; required Mod VCs to maintain precautions during transition to EOB.  Transfers Overall transfer level: Needs assistance Equipment used: Rolling walker (2 wheeled) Transfers: Sit to/from  Stand Sit to Stand: From elevated surface, Min assist, +2 safety/equipment General transfer comment: Min A to power up from elevated surface. +2 for safety in standing. Required VCs to manage RLE and for hand placement Ambulation/Gait Ambulation/Gait assistance: Mod assist Ambulation Distance (Feet): 30 Feet Assistive device: Rolling walker (2 wheeled) Gait Pattern/deviations: Shuffle, Antalgic, Step-to pattern, Trunk flexed (hop to pattern mostly to maintain PWB) General Gait Details: modA for stability with vc for maintaining weightbearing precautions Pt distance of ambulation in limited by UE strength  Gait velocity: slowed Gait velocity interpretation: Below normal speed for age/gender    ADL: ADL Overall ADL's : Needs assistance/impaired Eating/Feeding: Set up, Sitting Grooming: Set up, Sitting Upper Body Bathing: Set up, Sitting Lower Body Bathing: Sit to/from stand, Moderate assistance Upper Body Dressing : Set up, Sitting Lower Body Dressing: Sit to/from stand, Moderate assistance Toilet Transfer: Maximal assistance, +2 for physical assistance, +2 for safety/equipment, Stand-pivot, RW (Simulated to recliner) Tub/ Shower Transfer: Minimal assistance, +2 for safety/equipment, Tub transfer, Tub bench, Cueing for sequencing, Cueing for safety Functional mobility during ADLs: Maximal assistance, Rolling walker General ADL Comments: Pt very motivated  and willing to participate in therapy. Pt requires Max A +2 for transfers to A with managing RLE due to significant pain. Pt Max A for LB ADLs  Cognition: Cognition Overall Cognitive Status: Impaired/Different from baseline Orientation Level: Oriented X4 Cognition Arousal/Alertness: Awake/alert Behavior During Therapy: WFL for tasks assessed/performed Overall Cognitive Status: Impaired/Different from baseline Area of Impairment: Memory, Following commands, Problem solving, Safety/judgement Memory: Decreased recall of  precautions Following Commands: Follows one step commands with increased time Safety/Judgement: Decreased awareness of safety Problem Solving: Requires verbal cues, Requires tactile cues General Comments: Required cues throughout session to maintain post hip precautions during ADLs and funcitonal mobility  Physical Exam: Blood pressure 137/67, pulse 89, temperature 99.1 F (37.3 C), temperature source Oral, resp. rate 18, height 6' 3" (1.905 m), weight 123.5 kg (272 lb 4.8 oz), SpO2 98 %. Physical Exam  Vitals reviewed. Constitutional: He is oriented to person, place, and time. He appears well-developed and well-nourished.  HENT:  Head: Normocephalic and atraumatic.  Eyes: EOM are normal. Right eye exhibits no discharge. Left eye exhibits no discharge.  Neck: Normal range of motion. Neck supple. No thyromegaly present.  Cardiovascular: Normal rate, regular rhythm and normal heart sounds.   Respiratory: Effort normal and breath sounds normal. No respiratory distress.  GI: Soft. Bowel sounds are normal. He exhibits no distension.  Musculoskeletal: He exhibits edema and tenderness.  Neurological: He is alert and oriented to person, place, and time.  Motor: B/l UE 5/5 proximal to distal LLE: HF 4/5, distally 5/5 RLE: HF 2/5 (pain inhibition), ADF/PF 5/5  Skin: Skin is warm and dry.   Skin:  Hip incision dressed clean and dry with abduction brace in place  Psychiatric: He has a normal mood and affect. His behavior is normal.    Results for orders placed or performed during the hospital encounter of 09/26/16 (from the past 48 hour(s))  CBC     Status: Abnormal   Collection Time: 09/29/16  5:38 AM  Result Value Ref Range   WBC 8.3 4.0 - 10.5 K/uL   RBC 2.95 (L) 4.22 - 5.81 MIL/uL   Hemoglobin 9.2 (L) 13.0 - 17.0 g/dL   HCT 28.9 (L) 39.0 - 52.0 %   MCV 98.0 78.0 - 100.0 fL   MCH 31.2 26.0 - 34.0 pg   MCHC 31.8 30.0 - 36.0 g/dL   RDW 14.8 11.5 - 15.5 %   Platelets 220 150 - 400 K/uL   Basic metabolic panel     Status: Abnormal   Collection Time: 09/29/16  5:38 AM  Result Value Ref Range   Sodium 134 (L) 135 - 145 mmol/L    Comment: DELTA CHECK NOTED   Potassium 4.1 3.5 - 5.1 mmol/L   Chloride 96 (L) 101 - 111 mmol/L   CO2 29 22 - 32 mmol/L   Glucose, Bld 114 (H) 65 - 99 mg/dL   BUN 12 6 - 20 mg/dL   Creatinine, Ser 1.25 (H) 0.61 - 1.24 mg/dL   Calcium 8.8 (L) 8.9 - 10.3 mg/dL   GFR calc non Af Amer >60 >60 mL/min   GFR calc Af Amer >60 >60 mL/min    Comment: (NOTE) The eGFR has been calculated using the CKD EPI equation. This calculation has not been validated in all clinical situations. eGFR's persistently <60 mL/min signify possible Chronic Kidney Disease.    Anion gap 9 5 - 15  CBC     Status: Abnormal   Collection Time: 09/30/16  6:03 AM  Result Value Ref Range   WBC 8.0 4.0 - 10.5 K/uL   RBC 2.75 (L) 4.22 - 5.81 MIL/uL   Hemoglobin 8.5 (L) 13.0 - 17.0 g/dL   HCT 26.6 (L) 39.0 - 52.0 %   MCV 96.7 78.0 - 100.0 fL   MCH 30.9 26.0 - 34.0 pg   MCHC 32.0 30.0 - 36.0 g/dL   RDW 14.1 11.5 - 15.5 %   Platelets 225 150 - 400 K/uL   No results found.     Medical Problem List and Plan: 1.  Decreased functional mobility secondary to right hip periprosthetic fracture status post ORIF. Partial weightbearing 30 pounds with recent right total hip arthroplasty 08/10/2016 with posterior hip precautions 2.  DVT Prophylaxis/Anticoagulation: Eliquis. Check vascular study 3. Pain Management: Neurontin 600 mg 3 times a day, Hydrocodone as needed 4. Mood: Prozac 20 mg daily, BuSpar 15 mg 4 times a day 5. Neuropsych: This patient is capable of making decisions on his own behalf. 6. Skin/Wound Care: Routine skin checks 7. Fluids/Electrolytes/Nutrition: Routine I&O with follow-up chemistries 8. Acute blood loss anemia. Follow-up CBC 9. Hyperlipidemia. Zocor 10. BPH. Flomax 11. History of tobacco abuse. Counseling 12. Constipation. Laxative assistance   Post  Admission Physician Evaluation: 1. Preadmission assessment reviewed and changes made below. 2. Functional deficits secondary  to right hip periprosthetic fracture status post ORIF. 3. Patient is admitted to receive collaborative, interdisciplinary care between the physiatrist, rehab nursing staff, and therapy team. 4. Patient's level of medical complexity and substantial therapy needs in context of that medical necessity cannot be provided at a lesser intensity of care such as a SNF. 5. Patient has experienced substantial functional loss from his/her baseline which was documented above under the "Functional History" and "Functional Status" headings.  Judging by the patient's diagnosis, physical exam, and functional history, the patient has potential for functional progress which will result in measurable gains while on inpatient rehab.  These gains will be of substantial and practical use upon discharge  in facilitating mobility and self-care at the household level. 4. Physiatrist will provide 24 hour management of medical needs as well as oversight of the therapy plan/treatment and provide guidance as appropriate regarding the interaction of the two. 7. 24 hour rehab nursing will assist with bowel management, safety, skin/wound care, disease management, pain management and patient education  and help integrate therapy concepts, techniques,education, etc. 8. PT will assess and treat for/with: Lower extremity strength, range of motion, stamina, balance, functional mobility, safety, adaptive techniques and equipment, woundcare, coping skills, pain control, education.   Goals are: Mod I. 9. OT will assess and treat for/with: ADL's, functional mobility, safety, upper extremity strength, adaptive techniques and equipment, wound mgt, ego support, and community reintegration.   Goals are: Mod I. Therapy may proceed with showering this patient. 10. Case Management and Social Worker will assess and treat for  psychological issues and discharge planning. 11. Team conference will be held weekly to assess progress toward goals and to determine barriers to discharge. 12. Patient will receive at least 3 hours of therapy per day at least 5 days per week. 13. ELOS: 12-16 days.       14. Prognosis:  excellent  Delice Lesch, MD, Mellody Drown Lauraine Rinne J., PA-C 09/30/2016

## 2016-09-30 NOTE — Progress Notes (Signed)
I met with pt at bedside to discuss an inpt rehab admission. He is in agreement and prefers inpt rehab rather than SNF or Home with [facility] at this time. I contacted Dr. Lyla Glassing and he is in agreement to d/c to inpt rehab today. I will make the arrangements. RN CM is aware. [phone]

## 2016-09-30 NOTE — Progress Notes (Signed)
Occupational Therapy Treatment Patient Details Name: Russell Steele MRN: [id number] DOB: 12/24/1955 Today's Date: 09/30/2016    History of present illness 61 y.o. male who complains of right hip pain after a fall about 2 weeks ago. 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 past medical history of Anxiety; Arthritis; Depression; History of kidney stones; and Sleep apnea. has a past surgical history that includes Back surgery; Knee arthroscopy; Amputation toe   OT comments  Pt progressing towards established goals. However, continues to have difficulty maintain posterior hip precautions during ADLs and functional mobility. Pt perform LB dressing with Mod A and Max VCs for safety and adherence to precautions. Provided education on tub transfer using tub bench and pt performed transfer with Min A +2 to increase safety. Pt highly motivated to participate in therapy and feel he would benefit from intense therapy to optimize his safety and independence prior to transitioning home. Will continue to follow acutely as admitted.    Follow Up Recommendations  CIR;Supervision/Assistance - 24 hour    Equipment Recommendations  Other (comment);Tub/shower bench (Defer to next venue)    Recommendations for Other Services Rehab consult    Precautions / Restrictions Precautions Precautions: Posterior Hip;Fall Precaution Booklet Issued: Yes (comment) Precaution Comments: Pt able to recall 2/3 hip precautions after reviewing during session. Pt requires VCs to maintain precautions throughout session. Referred to provided handout Required Braces or Orthoses: Other Brace/Splint (hip abduction brace) Other Brace/Splint: hip abduction brace Restrictions Weight Bearing Restrictions: No RLE Weight Bearing: Partial weight bearing RLE Partial Weight Bearing Percentage or Pounds: 30       Mobility Bed Mobility Overal bed mobility: Needs  Assistance Bed Mobility: Supine to Sit     Supine to sit: Mod assist;HOB elevated     General bed mobility comments: Pt used bedrails and required Min A for lowing R legs down to floor while maintaining hip precautions. Pt started to cross legs to utilize LLE to move RLE; required Mod VCs to maintain precautions during transition to EOB.   Transfers Overall transfer level: Needs assistance Equipment used: Rolling walker (2 wheeled) Transfers: Sit to/from Stand Sit to Stand: From elevated surface;Min assist;+2 safety/equipment         General transfer comment: Min A to power up from elevated surface. +2 for safety in standing. Required VCs to manage RLE and for hand placement    Balance Overall balance assessment: Needs assistance Sitting-balance support: No upper extremity supported;Feet supported Sitting balance-Leahy Scale: Fair     Standing balance support: Bilateral upper extremity supported Standing balance-Leahy Scale: Fair Standing balance comment: requires RW support                            ADL either performed or assessed with clinical judgement   ADL Overall ADL's : Needs assistance/impaired             Lower Body Bathing: Sit to/from stand;Moderate assistance   Upper Body Dressing : Set up;Sitting   Lower Body Dressing: Sit to/from stand;Moderate assistance           Tub/ Shower Transfer: Minimal assistance;+2 for safety/equipment;Tub transfer;Tub bench;Cueing for sequencing;Cueing for safety   Functional mobility during ADLs: Maximal assistance;Rolling walker General ADL Comments: Pt very motivated and willing to participate in therapy. Pt requires Max A +2 for transfers to A with managing RLE due to significant pain. Pt Max  A for LB ADLs     Vision       Perception     Praxis      Cognition Arousal/Alertness: Awake/alert Behavior During Therapy: WFL for tasks assessed/performed Overall Cognitive Status: Impaired/Different  from baseline Area of Impairment: Memory;Following commands;Problem solving;Safety/judgement                     Memory: Decreased recall of precautions Following Commands: Follows one step commands with increased time Safety/Judgement: Decreased awareness of safety   Problem Solving: Requires verbal cues;Requires tactile cues General Comments: Required cues throughout session to maintain post hip precautions during ADLs and funcitonal mobility        Exercises    Shoulder Instructions       General Comments      Pertinent Vitals/ Pain       Pain Assessment: 0-10 Pain Score: 8  Pain Location: R hip Pain Descriptors / Indicators: Constant;Discomfort;Grimacing;Guarding;Sharp;Shooting Pain Intervention(s): Monitored during session  Home Living                                          Prior Functioning/Environment              Frequency  Min 2X/week        Progress Toward Goals  OT Goals(current goals can now be found in the care plan section)     Acute Rehab OT Goals Patient Stated Goal: Walk without pain OT Goal Formulation: With patient Time For Goal Achievement: 10/12/16 Potential to Achieve Goals: Good  Plan      Co-evaluation    PT/OT/SLP Co-Evaluation/Treatment: Yes Reason for Co-Treatment: Complexity of the patient's impairments (multi-system involvement) PT goals addressed during session: Mobility/safety with mobility OT goals addressed during session: ADL's and self-care      AM-PAC PT "6 Clicks" Daily Activity     Outcome Measure   Help from another person eating meals?: None Help from another person taking care of personal grooming?: A Little Help from another person toileting, which includes using toliet, bedpan, or urinal?: A Little Help from another person bathing (including washing, rinsing, drying)?: A Lot Help from another person to put on and taking off regular upper body clothing?: None Help from  another person to put on and taking off regular lower body clothing?: A Lot 6 Click Score: 18    End of Session Equipment Utilized During Treatment: Gait belt;Rolling walker;Other (comment) (hip abduction brace)  OT Visit Diagnosis: Unsteadiness on feet (R26.81);Muscle weakness (generalized) (M62.81);History of falling (Z91.81);Pain Pain - Right/Left: Right Pain - part of body: Leg   Activity Tolerance Patient tolerated treatment well   Patient Left in chair;with call bell/phone within reach   Nurse Communication Mobility status;Patient requests pain meds        Time: 1610-9604 OT Time Calculation (min): 48 min  Charges: OT General Charges $OT Visit: 1 Procedure OT Treatments $Self Care/Home Management : 23-37 mins  Russell Steele, OTR/L [phone]   Theodoro Grist Dakari Cregger 09/30/2016, 11:00 AM

## 2016-09-30 NOTE — Progress Notes (Signed)
Marcello Fennel, MD Physician Signed Physical Medicine and Rehabilitation  Consult Note Date of Service: 09/30/2016 11:45 AM  Related encounter: Admission (Current) from 09/26/2016 in [facility]     Expand All Collapse All   [] Hide copied text [] Hover for attribution information      Physical Medicine and Rehabilitation Consult Reason for Consult: Decreased functional mobility Referring Physician: Dr.Swinteck   HPI: Russell Steele is a 61 y.o. 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 Patient lives with spouse. Independent prior to total hip replacement. One level home with ramped entrance. History taken from patient and chart review.  Wife has had a stroke in the past and is "useless" per pt. There is also a niece  in the home who can assist. Presented 09/27/2016 after a fall and inability to bear weight on the right leg. X-rays and imaging revealed right hip periprosthetic fracture. Underwent ORIF 09/27/2016 per Dr. Linna Caprice. Hospital course pain management. Partial weightbearing right lower extremity of 30 pounds. Fitted with hip abduction brace as well as posterior hip precautions. Acute blood loss anemia 8.5 and monitor. Placed on Eliquis for DVT prophylaxis. Physical occupational therapy evaluations completed with recommendations of physical medicine rehabilitation consult.   Review of Systems  Constitutional: Negative for chills and fever.  HENT: Negative for hearing loss.   Eyes: Negative for blurred vision, double vision and discharge.  Respiratory: Positive for shortness of breath. Negative for cough.   Cardiovascular: Positive for leg swelling. Negative for chest pain.  Gastrointestinal: Positive for constipation. Negative for nausea and vomiting.  Genitourinary: Positive for urgency. Negative for dysuria and hematuria.  Musculoskeletal:  Positive for falls, joint pain and myalgias.  Skin: Negative for rash.  Neurological: Negative for seizures.  Psychiatric/Behavioral: Positive for depression.       Anxiety  All other systems reviewed and are negative.  Past Medical History:  Diagnosis Date  . Anxiety   . Arthritis   . Depression   . History of kidney stones   . Sleep apnea         Past Surgical History:  Procedure Laterality Date  . AMPUTATION TOE    . APPENDECTOMY    . BACK SURGERY    . DENTAL SURGERY    . KNEE ARTHROSCOPY    . ORIF FEMUR FRACTURE Right 09/26/2016  . TOTAL HIP ARTHROPLASTY Right 08/10/2016   Procedure: RIGHT TOTAL HIP ARTHROPLASTY ANTERIOR APPROACH;  Surgeon: Samson Frederic, MD;  Location: [facility] OR;  Service: Orthopedics;  Laterality: Right;   History reviewed. No pertinent family history of hip  fracture. Social History:  reports that he has been smoking Cigarettes.  He has been smoking about 0.50 packs per day. He has never used smokeless tobacco. He reports that he drinks about 1.8 oz of alcohol per week . He reports that he does not use drugs. Allergies:      Allergies  Allergen Reactions  . Bee Venom Anaphylaxis         Medications Prior to Admission  Medication Sig Dispense Refill  . acetaminophen (TYLENOL) 500 MG tablet Take 1,000 mg by mouth every 6 (six) hours as needed for mild pain.    Marland Kitchen albuterol (PROVENTIL HFA;VENTOLIN HFA) 108 (90 Base) MCG/ACT inhaler Inhale 2 puffs into the lungs every 6 (six) hours as needed for wheezing or shortness of breath.    . busPIRone (BUSPAR) 15 MG tablet Take 15 mg  by mouth 4 (four) times daily.     . diphenhydrAMINE (BENADRYL) 25 mg capsule Take 50 mg by mouth at bedtime.     Marland Kitchen FLUoxetine (PROZAC) 20 MG capsule Take 20 mg by mouth daily.    Marland Kitchen gabapentin (NEURONTIN) 300 MG capsule Take 600 mg by mouth 3 (three) times daily.    . Multiple Vitamin (MULTIVITAMIN WITH MINERALS) TABS tablet Take 1 tablet by mouth daily.     Marland Kitchen oxyCODONE (OXY IR/ROXICODONE) 5 MG immediate release tablet Take 3-4 tablets (15-20 mg total) by mouth every 3 (three) hours as needed for breakthrough pain. (Patient taking differently: Take 15 mg by mouth 4 (four) times daily. ) 90 tablet 0  . simvastatin (ZOCOR) 20 MG tablet Take 20 mg by mouth at bedtime.    . tamsulosin (FLOMAX) 0.4 MG CAPS capsule Take 0.4 mg by mouth at bedtime.    . [DISCONTINUED] aspirin 81 MG chewable tablet Chew 1 tablet (81 mg total) by mouth 2 (two) times daily. 60 tablet 1  . docusate sodium (COLACE) 100 MG capsule Take 1 capsule (100 mg total) by mouth 2 (two) times daily. (Patient not taking: Reported on 09/26/2016) 60 capsule 1  . ondansetron (ZOFRAN) 4 MG tablet Take 1 tablet (4 mg total) by mouth every 6 (six) hours as needed for nausea. (Patient not taking: Reported on 09/26/2016) 20 tablet 0  . oxymetazoline (AFRIN) 0.05 % nasal spray Place 1 spray into both nostrils 2 (two) times daily as needed for congestion.    . senna (SENOKOT) 8.6 MG TABS tablet Take 2 tablets (17.2 mg total) by mouth at bedtime. (Patient not taking: Reported on 09/26/2016) 120 each 0    Home: Home Living Family/patient expects to be discharged to:: Unsure Living Arrangements: Spouse/significant other Available Help at Discharge: Family Type of Home: House Home Access: Ramped entrance Home Layout: One level Bathroom Shower/Tub: Tub/shower unit, Engineer, building services: Handicapped height Bathroom Accessibility: Yes Home Equipment: Grab bars - tub/shower, Environmental consultant - standard, Wheelchair - manual, Bedside commode (lift chair; BSC too small for his hips)  Functional History: Prior Function Level of Independence: Independent Comments: Pt was doing well after prior R THA. 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 Functional Status:  Mobility: Bed Mobility Overal bed mobility: Needs Assistance Bed Mobility: Supine to Sit Supine to sit: Mod assist, HOB  elevated General bed mobility comments: Pt used bedrails and required Min A for lowing R legs down to floor while maintaining hip precautions. Pt started to cross legs to utilize LLE to move RLE; required Mod VCs to maintain precautions during transition to EOB.  Transfers Overall transfer level: Needs assistance Equipment used: Rolling walker (2 wheeled) Transfers: Sit to/from Stand Sit to Stand: From elevated surface, Min assist, +2 safety/equipment General transfer comment: Min A to power up from elevated surface. +2 for safety in standing. Required VCs to manage RLE and for hand placement Ambulation/Gait Ambulation/Gait assistance: Mod assist Ambulation Distance (Feet): 18 Feet Assistive device: Rolling walker (2 wheeled) Gait Pattern/deviations: Shuffle, Antalgic, Trunk flexed (hop to pattern ) General Gait Details: modA for stability vc for weightbearing pt able to utilize UE on RW to advance L LE Pt limited by UE fatigue Gait velocity: slowed Gait velocity interpretation: Below normal speed for age/gender  ADL: ADL Overall ADL's : Needs assistance/impaired Eating/Feeding: Set up, Sitting Grooming: Set up, Sitting Upper Body Bathing: Set up, Sitting Lower Body Bathing: Sit to/from stand, Moderate assistance Upper Body Dressing :  Set up, Sitting Lower Body Dressing: Sit to/from stand, Moderate assistance Toilet Transfer: Maximal assistance, +2 for physical assistance, +2 for safety/equipment, Stand-pivot, RW (Simulated to recliner) Tub/ Shower Transfer: Minimal assistance, +2 for safety/equipment, Tub transfer, Tub bench, Cueing for sequencing, Cueing for safety Functional mobility during ADLs: Maximal assistance, Rolling walker General ADL Comments: Pt very motivated and willing to participate in therapy. Pt requires Max A +2 for transfers to A with managing RLE due to significant pain. Pt Max A for LB ADLs  Cognition: Cognition Overall Cognitive Status: Impaired/Different  from baseline Orientation Level: Oriented X4 Cognition Arousal/Alertness: Awake/alert Behavior During Therapy: WFL for tasks assessed/performed Overall Cognitive Status: Impaired/Different from baseline Area of Impairment: Memory, Following commands, Problem solving, Safety/judgement Memory: Decreased recall of precautions Following Commands: Follows one step commands with increased time Safety/Judgement: Decreased awareness of safety Problem Solving: Requires verbal cues, Requires tactile cues General Comments: Required cues throughout session to maintain post hip precautions during ADLs and funcitonal mobility  Blood pressure 137/67, pulse 89, temperature 99.1 F (37.3 C), temperature source Oral, resp. rate 18, height 6\' 3"  (1.905 m), weight 123.5 kg (272 lb 4.8 oz), SpO2 98 %. Physical Exam  Vitals reviewed. Constitutional: He is oriented to person, place, and time. He appears well-developed and well-nourished.  HENT:  Head: Normocephalic and atraumatic.  Eyes: Conjunctivae and EOM are normal. Right eye exhibits no discharge. Left eye exhibits no discharge.  Neck: Normal range of motion. Neck supple. No thyromegaly present.  Cardiovascular: Normal rate, regular rhythm and normal heart sounds.   Respiratory: Effort normal and breath sounds normal. No respiratory distress.  GI: Soft. Bowel sounds are normal. He exhibits no distension.  Musculoskeletal: He exhibits edema and tenderness.  Neurological: He is alert and oriented to person, place, and time.  Motor: B/l UE 5/5 proximal to distal LLE: HF 4/5, distally 5/5 RLE: HF 2/5 (pain inhibition), ADF/PF 5/5  Skin: Skin is warm and dry.  Hip incision is dressed with abduction brace in place  Psychiatric: He has a normal mood and affect. His behavior is normal.    Lab Results Last 24 Hours       Results for orders placed or performed during the hospital encounter of 09/26/16 (from the past 24 hour(s))  CBC     Status: Abnormal    Collection Time: 09/30/16  6:03 AM  Result Value Ref Range   WBC 8.0 4.0 - 10.5 K/uL   RBC 2.75 (L) 4.22 - 5.81 MIL/uL   Hemoglobin 8.5 (L) 13.0 - 17.0 g/dL   HCT 14.7 (L) 82.9 - 56.2 %   MCV 96.7 78.0 - 100.0 fL   MCH 30.9 26.0 - 34.0 pg   MCHC 32.0 30.0 - 36.0 g/dL   RDW 13.0 86.5 - 78.4 %   Platelets 225 150 - 400 K/uL     Imaging Results (Last 48 hours)  No results found.    Assessment/Plan: Diagnosis: Right hip periprosthetic fracture Labs and images independently reviewed.  Records reviewed and summated above.  1. Does the need for close, 24 hr/day medical supervision in concert with the patient's rehab needs make it unreasonable for this patient to be served in a less intensive setting? Yes 2. 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 Due to bowel management, safety, skin/wound care, disease management, pain management and patient education, does the patient require 24 hr/day rehab nursing? Yes 4.  Does the patient require coordinated care of a physician, rehab nurse, PT (1-2 hrs/day, 5 days/week) and OT (1-2 hrs/day, 5 days/week) to address physical and functional deficits in the context of the above medical diagnosis(es)? Yes Addressing deficits in the following areas: balance, endurance, locomotion, strength, transferring, bowel/bladder control, bathing, dressing, toileting and psychosocial support 5. Can the patient actively participate in an intensive therapy program of at least 3 hrs of therapy per day at least 5 days per week? Yes 6. The potential for patient to make measurable gains while on inpatient rehab is excellent 7. Anticipated functional outcomes upon discharge from inpatient rehab are modified independent  with PT, modified independent and supervision with OT, n/a with SLP. 8. Estimated  rehab length of stay to reach the above functional goals is: 12-16 days. 9. Anticipated D/C setting: Home 10. Anticipated post D/C treatments: HH therapy and Home excercise program 11. Overall Rehab/Functional Prognosis: excellent  RECOMMENDATIONS: This patient's condition is appropriate for continued rehabilitative care in the following setting: CIR today. Patient has agreed to participate in recommended program. Yes Note that insurance prior authorization may be required for reimbursement for recommended care.  Comment: Rehab Admissions Coordinator to follow up.  Maryla MorrowAnkit Patel, MD, [place] DomFAAPMR Charlton AmorANGIULLI,DANIEL J., PA-C 09/30/2016    Revision History                        Routing History

## 2016-09-30 NOTE — Progress Notes (Signed)
Patient will be transferring to inpatient rehab today, time unknown at present

## 2016-09-30 NOTE — Progress Notes (Signed)
Standley Brooking, RN Rehab Admission Coordinator Signed Physical Medicine and Rehabilitation  PMR Pre-admission Date of Service: 09/30/2016 1:48 PM  Related encounter: Admission (Current) from 09/26/2016 in [facility]       [] Hide copied text PMR Admission Coordinator Pre-Admission Assessment  Patient: Russell Steele is an 61 y.o., male MRN: [id number] DOB: [birth year] Height: 6\' 3"  (190.5 cm) Weight: 123.5 kg (272 lb 4.8 oz) (patient wearing brace that must be maintained)                                                                                                                                                  Insurance Information HMO:     PPO:      PCP:      IPA:      80/20: yes     OTHER:  No HMO PRIMARY: Medicare part A only      Policy#: [id number]      Subscriber: pt Benefits:  Phone #: passport one online     Name: 09/30/2016 Eff. Date: 01/03/2015     Deduct: $1340      Out of Pocket Max: none      Life Max: none CIR: 100%      SNF: 20 full days Outpatient: Medicare a only     Co-Pay:  Home Health: Medicare A only      Co-Pay:  DME: Medicare a only     Co-Pay:  Providers: pt choice  SECONDARY: Generic [facility]      Policy#: [id number]      Subscriber: pt  Patient states he has been workers compensation since injury 2010. [facility]opedic for his original THA. I explained to pt that Medicare part A would be covering his inpt rehab, not the [place]. He was in agreement. He states workers compensation unaware of his hip issues.  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 Presented 09/27/2016 after a fall and inability to bear weight on the right leg. X-rays and imaging revealed right hip periprosthetic fracture. Underwent ORIF 09/27/2016 per Dr. Linna Caprice. Hospital course pain management. Partial weightbearing right lower extremity of 30 pounds. Fitted with hip abduction brace as well as posterior hip precautions. Acute blood loss anemia 8.5 and monitor. Placed on Eliquisfor  DVT prophylaxis.   Past Medical History      Past Medical History:  Diagnosis Date  . Anxiety   . Arthritis   . Depression   . History of kidney stones   . Sleep apnea     Family History  family history is not on file.  Prior Rehab/Hospitalizations:  Has the patient had major surgery during 100 days prior to admission? Yes  Current Medications   Current Facility-Administered Medications:  .  acetaminophen (TYLENOL) tablet 650 mg, 650 mg, Oral, Q6H PRN **OR** acetaminophen (TYLENOL) suppository 650 mg, 650 mg, Rectal, Q6H PRN, Swinteck, Arlys John, MD .  albuterol (PROVENTIL) (2.5 MG/3ML) 0.083% nebulizer solution 2.5 mg, 2.5 mg, Nebulization, Q6H PRN, Allena Katz, RPH .  apixaban (ELIQUIS) tablet 2.5 mg, 2.5 mg, Oral, BID, Swinteck, Arlys John, MD, 2.5 mg at 09/30/16 1036 .  busPIRone (BUSPAR) tablet 15 mg, 15 mg, Oral, QID, Swinteck, Arlys John, MD, 15 mg at 09/30/16 1036 .  diphenhydrAMINE (BENADRYL) capsule 50 mg, 50 mg, Oral, QHS, Swinteck, Arlys John, MD, 50 mg at 09/29/16 2122 .  docusate sodium (COLACE) capsule 100 mg, 100 mg, Oral, BID, Swinteck, Arlys John, MD, 100 mg at 09/30/16 1036 .  FLUoxetine (PROZAC) capsule 20 mg, 20 mg, Oral, Daily, Swinteck, Arlys John, MD, 20 mg at 09/30/16 1036 .  gabapentin (NEURONTIN) capsule 600 mg, 600 mg, Oral, TID, Swinteck, Arlys John, MD, 600 mg at 09/30/16 1036 .  HYDROcodone-acetaminophen  (NORCO/VICODIN) 5-325 MG per tablet 1-2 tablet, 1-2 tablet, Oral, Q6H PRN, Yolonda Kida, MD, 2 tablet at 09/30/16 1054 .  HYDROmorphone (DILAUDID) injection 0.5-1 mg, 0.5-1 mg, Intravenous, Q2H PRN, Samson Frederic, MD, 1 mg at 09/28/16 1802 .  menthol-cetylpyridinium (CEPACOL) lozenge 3 mg, 1 lozenge, Oral, PRN **OR** phenol (CHLORASEPTIC) mouth spray 1 spray, 1 spray, Mouth/Throat, PRN, Swinteck, Arlys John, MD .  metoCLOPramide (REGLAN) tablet 5-10 mg, 5-10 mg, Oral, Q8H PRN **OR** metoCLOPramide (REGLAN) injection 5-10 mg, 5-10 mg, Intravenous, Q8H PRN, Swinteck, Arlys John, MD .  multivitamin with minerals tablet 1 tablet, 1 tablet, Oral, Daily, Swinteck, Arlys John, MD, 1 tablet at 09/30/16 1036 .  ondansetron (ZOFRAN) tablet 4 mg, 4 mg, Oral, Q6H PRN **OR** ondansetron (ZOFRAN) injection 4 mg, 4 mg, Intravenous, Q6H PRN, Swinteck, Arlys John, MD .  oxyCODONE (Oxy IR/ROXICODONE) immediate release tablet 10-15 mg, 10-15 mg, Oral, Q4H PRN, Samson Frederic, MD, 15 mg at 09/30/16 0744 .  polyethylene glycol (MIRALAX / GLYCOLAX) packet 17 g, 17 g, Oral, Daily PRN, Swinteck, Arlys John, MD .  senna (SENOKOT) tablet 8.6 mg, 1 tablet, Oral, BID, Swinteck, Arlys John, MD, 8.6 mg at 09/30/16 1036 .  simvastatin (ZOCOR) tablet 20 mg, 20 mg, Oral, QHS, Swinteck, Arlys John, MD, 20 mg at 09/29/16 2123 .  tamsulosin (FLOMAX) capsule 0.4 mg, 0.4 mg, Oral, QHS, Swinteck, Arlys John, MD, 0.4 mg at 09/29/16 2122  Patients Current Diet: Diet regular Room service appropriate? Yes; Fluid consistency: Thin Diet - low sodium heart healthy  Precautions / Restrictions Precautions Precautions: Posterior Hip, Fall Precaution Booklet Issued: Yes (comment) Precaution Comments: Pt able to recall 2/3 hip precautions after reviewing during session. Pt requires VCs to maintain precautions throughout session. 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 None of the above  Prior Functional Level Prior Function Level of Independence: Independent Comments: Pt was doing well after prior R THA. 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  Self Care: Did the patient need help bathing, dressing, using the toilet or eating?  Independent  Indoor Mobility: Did the patient need assistance with walking from room to room (with or without device)? Independent  Stairs: Did the patient need assistance with internal or external stairs (with or without device)? Independent  Functional Cognition: Did the patient need help planning regular tasks such as shopping or remembering to take medications? Independent  Current Functional Level Cognition  Overall Cognitive Status: Impaired/Different from baseline Orientation Level: Oriented X4 Following Commands: Follows one step commands with increased time Safety/Judgement: Decreased awareness of safety General Comments: Required cues throughout session to maintain post hip precautions during ADLs and funcitonal mobility    Extremity Assessment (includes Sensation/Coordination)  Upper Extremity Assessment: Overall WFL for tasks assessed  Lower Extremity Assessment: Defer to PT evaluation RLE Deficits / Details: ROM and  strength of hip and knee could not be assessed due to surgical pain RLE: Unable to fully assess due to pain, Unable to fully assess due to immobilization RLE Coordination: decreased gross motor, decreased fine motor    ADLs  Overall ADL's : Needs assistance/impaired Eating/Feeding: Set up, Sitting Grooming: Set up, Sitting Upper Body Bathing: Set up, Sitting Lower Body Bathing: Sit to/from stand, Moderate assistance Upper Body Dressing : Set up, Sitting Lower Body Dressing: Sit to/from stand, Moderate assistance Toilet Transfer: Maximal assistance, +2 for physical assistance, +2 for safety/equipment, Stand-pivot, RW (Simulated to recliner) Tub/ Shower Transfer: Minimal assistance, +2 for safety/equipment, Tub transfer, Tub bench, Cueing for sequencing, Cueing for safety Functional mobility during ADLs: Maximal assistance, Rolling walker General ADL Comments: Pt very motivated and willing to participate in therapy. Pt requires Max A +2 for transfers to A with managing RLE due to significant pain. Pt Max A for LB ADLs    Mobility  Overal bed mobility: Needs Assistance Bed Mobility: Supine to Sit Supine to sit: Mod assist, HOB elevated General bed mobility comments: Pt used bedrails and required Min A for lowing R legs down to floor while maintaining hip precautions. Pt started to cross legs to utilize LLE to move RLE; required Mod VCs to maintain precautions during transition to EOB.     Transfers  Overall transfer level: Needs assistance Equipment used: Rolling walker (2 wheeled) Transfers: Sit to/from Stand Sit to Stand: From elevated surface, Min assist, +2 safety/equipment General transfer comment: Min A to power up from elevated surface. +2 for safety in standing. Required VCs to manage RLE and for hand placement    Ambulation / Gait / Stairs / Wheelchair Mobility  Ambulation/Gait Ambulation/Gait assistance: Mod assist Ambulation Distance (Feet): 30 Feet Assistive device:  Rolling walker (2 wheeled) Gait Pattern/deviations: Shuffle, Antalgic, Step-to pattern, Trunk flexed (hop to pattern mostly to maintain PWB) General Gait Details: modA for stability with vc for maintaining weightbearing precautions Pt distance of ambulation in limited by UE strength  Gait velocity: slowed Gait velocity interpretation: Below normal speed for age/gender  Posture / Balance Balance Overall balance assessment: Needs assistance Sitting-balance support: No upper extremity supported, Feet supported Sitting balance-Leahy Scale: Fair Standing balance support: Bilateral upper extremity supported Standing balance-Leahy Scale: Fair Standing balance comment: requires RW support     Special needs/care consideration BiPAP/CPAP yes pta CPM  N/a Continuous Drip IV  N/a Dialysis  N/a Life Vest  N/a Oxygen  N/a Special Bed  N/a Trach Size  N/a Wound Vac n/a Skin surgical incision Bowel mgmt: continent LBM 09/27/2016 Bladder mgmt: continent Diabetic mgmt n/a   Previous Home Environment Living Arrangements: Spouse/significant other  Lives With: Spouse Available Help at Discharge:  (niece helping with his wife since he was hospitalized) Type of Home: House Home Layout: One level Home Access: Ramped entrance Bathroom Shower/Tub: Tub/shower unit, Engineer, building services: Handicapped height Bathroom Accessibility: Yes How Accessible: Accessible via walker Home Care Services: No  Discharge Living Setting Plans for Discharge Living Setting: Patient's home, Lives with (comment) (spouse) Type of Home at Discharge: House Discharge Home Layout: One level Discharge Home Access: Ramped entrance Discharge Bathroom Shower/Tub: Tub/shower unit, Curtain Discharge Bathroom Toilet: Handicapped height Discharge Bathroom Accessibility: Yes How Accessible: Accessible via walker Does the patient have any problems obtaining your medications?: No  Social/Family/Support Systems Patient  Roles: Spouse, Caregiver Contact Information: Fleet Contras, niece and wife, Verlon Au Anticipated Caregiver: self and niece prn Anticipated Caregiver's Contact Information: see above Ability/Limitations of Caregiver: niece in pt's home as caregiver for pt's wife since he was hospitalized Caregiver Availability: Intermittent Discharge Plan Discussed with Primary Caregiver: Yes Is Caregiver In Agreement with Plan?: Yes Does Caregiver/Family have Issues with Lodging/Transportation while Pt is in Rehab?: No   Patient's wife had a CVA 2017. She received her rehab at [facility] and returned home after about 1 1/2 months. She currently is ambulatory and he provides her supervision with all adls and ambulation. She has very poor memory. Pt's niece did not live with them pta. She is assisting in the home with pt's wife since he was hospitalized. Pt's daughter is in [place].  Goals/Additional Needs Patient/Family Goal for Rehab: Mod I with PT and OT Expected length of stay: ELOS 16-19 days Pt/Family Agrees to Admission and willing to participate: Yes Program Orientation Provided & Reviewed with Pt/Caregiver Including Roles  & Responsibilities: Yes  Decrease burden of Care through IP rehab admission: n/a  Possible need for SNF placement upon discharge: not anticipated  Patient Condition: This patient's condition remains as documented in the consult dated 09/30/2016, in which the Rehabilitation Physician determined and documented that the patient's condition is appropriate for intensive rehabilitative care in an inpatient rehabilitation facility. Will admit to inpatient rehab today.  Preadmission Screen Completed By:  Clois Dupes, 09/30/2016 1:58 PM ______________________________________________________________________   Discussed status with Dr. Allena Katz on 09/30/2016 at  1358 and received telephone approval for admission today.  Admission Coordinator:  Clois Dupes, time 6962 Date 09/30/2016        Cosigned by: Marcello Fennel, MD at 09/30/2016 2:05 PM  Revision History

## 2016-09-30 NOTE — Consult Note (Signed)
Physical Medicine and Rehabilitation Consult Reason for Consult: Decreased functional mobility Referring Physician: Dr.Swinteck   HPI: Russell Steele is a 61 y.o. 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 Patient lives with spouse. Independent prior to total hip replacement. One level home with ramped entrance. History taken from patient and chart review.  Wife has had a stroke in the past and is "useless" per pt. There is also a niece  in the home who can assist. Presented 09/27/2016 after a fall and inability to bear weight on the right leg. X-rays and imaging revealed right hip periprosthetic fracture. Underwent ORIF 09/27/2016 per Dr. Linna Caprice. Hospital course pain management. Partial weightbearing right lower extremity of 30 pounds. Fitted with hip abduction brace as well as posterior hip precautions. Acute blood loss anemia 8.5 and monitor. Placed on Eliquis for DVT prophylaxis. Physical occupational therapy evaluations completed with recommendations of physical medicine rehabilitation consult.   Review of Systems  Constitutional: Negative for chills and fever.  HENT: Negative for hearing loss.   Eyes: Negative for blurred vision, double vision and discharge.  Respiratory: Positive for shortness of breath. Negative for cough.   Cardiovascular: Positive for leg swelling. Negative for chest pain.  Gastrointestinal: Positive for constipation. Negative for nausea and vomiting.  Genitourinary: Positive for urgency. Negative for dysuria and hematuria.  Musculoskeletal: Positive for falls, joint pain and myalgias.  Skin: Negative for rash.  Neurological: Negative for seizures.  Psychiatric/Behavioral: Positive for depression.       Anxiety  All other systems reviewed and are negative.  Past Medical History:  Diagnosis Date  . Anxiety   . Arthritis   . Depression   . History of kidney  stones   . Sleep apnea    Past Surgical History:  Procedure Laterality Date  . AMPUTATION TOE    . APPENDECTOMY    . BACK SURGERY    . DENTAL SURGERY    . KNEE ARTHROSCOPY    . ORIF FEMUR FRACTURE Right 09/26/2016  . TOTAL HIP ARTHROPLASTY Right 08/10/2016   Procedure: RIGHT TOTAL HIP ARTHROPLASTY ANTERIOR APPROACH;  Surgeon: Samson Frederic, MD;  Location: [facility] OR;  Service: Orthopedics;  Laterality: Right;   History reviewed. No pertinent family history of hip  fracture. Social History:  reports that he has been smoking Cigarettes.  He has been smoking about 0.50 packs per day. He has never used smokeless tobacco. He reports that he drinks about 1.8 oz of alcohol per week . He reports that he does not use drugs. Allergies:  Allergies  Allergen Reactions  . Bee Venom Anaphylaxis   Medications Prior to Admission  Medication Sig Dispense Refill  . acetaminophen (TYLENOL) 500 MG tablet Take 1,000 mg by mouth every 6 (six) hours as needed for mild pain.    Marland Kitchen albuterol (PROVENTIL HFA;VENTOLIN HFA) 108 (90 Base) MCG/ACT inhaler Inhale 2 puffs into the lungs every 6 (six) hours as needed for wheezing or shortness of breath.    . busPIRone (BUSPAR) 15 MG tablet Take 15 mg by mouth 4 (four) times daily.     . diphenhydrAMINE (BENADRYL) 25 mg capsule Take 50 mg by mouth at bedtime.     Marland Kitchen FLUoxetine (PROZAC) 20 MG capsule Take 20 mg by mouth daily.    Marland Kitchen gabapentin (NEURONTIN) 300 MG capsule Take 600 mg by mouth 3 (three) times daily.    . Multiple Vitamin (MULTIVITAMIN WITH MINERALS) TABS  tablet Take 1 tablet by mouth daily.    Marland Kitchen. oxyCODONE (OXY IR/ROXICODONE) 5 MG immediate release tablet Take 3-4 tablets (15-20 mg total) by mouth every 3 (three) hours as needed for breakthrough pain. (Patient taking differently: Take 15 mg by mouth 4 (four) times daily. ) 90 tablet 0  . simvastatin (ZOCOR) 20 MG tablet Take 20 mg by mouth at bedtime.    . tamsulosin (FLOMAX) 0.4 MG CAPS capsule Take 0.4 mg by mouth  at bedtime.    . [DISCONTINUED] aspirin 81 MG chewable tablet Chew 1 tablet (81 mg total) by mouth 2 (two) times daily. 60 tablet 1  . docusate sodium (COLACE) 100 MG capsule Take 1 capsule (100 mg total) by mouth 2 (two) times daily. (Patient not taking: Reported on 09/26/2016) 60 capsule 1  . ondansetron (ZOFRAN) 4 MG tablet Take 1 tablet (4 mg total) by mouth every 6 (six) hours as needed for nausea. (Patient not taking: Reported on 09/26/2016) 20 tablet 0  . oxymetazoline (AFRIN) 0.05 % nasal spray Place 1 spray into both nostrils 2 (two) times daily as needed for congestion.    . senna (SENOKOT) 8.6 MG TABS tablet Take 2 tablets (17.2 mg total) by mouth at bedtime. (Patient not taking: Reported on 09/26/2016) 120 each 0    Home: Home Living Family/patient expects to be discharged to:: Unsure Living Arrangements: Spouse/significant other Available Help at Discharge: Family Type of Home: House Home Access: Ramped entrance Home Layout: One level Bathroom Shower/Tub: Tub/shower unit, Engineer, building servicesCurtain Bathroom Toilet: Handicapped height Bathroom Accessibility: Yes Home Equipment: Grab bars - tub/shower, Environmental consultantWalker - standard, Wheelchair - manual, Bedside commode (lift chair; BSC too small for his hips)  Functional History: Prior Function Level of Independence: Independent Comments: Pt was doing well after prior R THA. 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 Functional Status:  Mobility: Bed Mobility Overal bed mobility: Needs Assistance Bed Mobility: Supine to Sit Supine to sit: Mod assist, HOB elevated General bed mobility comments: Pt used bedrails and required Min A for lowing R legs down to floor while maintaining hip precautions. Pt started to cross legs to utilize LLE to move RLE; required Mod VCs to maintain precautions during transition to EOB.  Transfers Overall transfer level: Needs assistance Equipment used: Rolling walker (2 wheeled) Transfers: Sit to/from Stand Sit  to Stand: From elevated surface, Min assist, +2 safety/equipment General transfer comment: Min A to power up from elevated surface. +2 for safety in standing. Required VCs to manage RLE and for hand placement Ambulation/Gait Ambulation/Gait assistance: Mod assist Ambulation Distance (Feet): 18 Feet Assistive device: Rolling walker (2 wheeled) Gait Pattern/deviations: Shuffle, Antalgic, Trunk flexed (hop to pattern ) General Gait Details: modA for stability vc for weightbearing pt able to utilize UE on RW to advance L LE Pt limited by UE fatigue Gait velocity: slowed Gait velocity interpretation: Below normal speed for age/gender    ADL: ADL Overall ADL's : Needs assistance/impaired Eating/Feeding: Set up, Sitting Grooming: Set up, Sitting Upper Body Bathing: Set up, Sitting Lower Body Bathing: Sit to/from stand, Moderate assistance Upper Body Dressing : Set up, Sitting Lower Body Dressing: Sit to/from stand, Moderate assistance Toilet Transfer: Maximal assistance, +2 for physical assistance, +2 for safety/equipment, Stand-pivot, RW (Simulated to recliner) Tub/ Shower Transfer: Minimal assistance, +2 for safety/equipment, Tub transfer, Tub bench, Cueing for sequencing, Cueing for safety Functional mobility during ADLs: Maximal assistance, Rolling walker General ADL Comments: Pt very motivated and willing to participate in therapy.  Pt requires Max A +2 for transfers to A with managing RLE due to significant pain. Pt Max A for LB ADLs  Cognition: Cognition Overall Cognitive Status: Impaired/Different from baseline Orientation Level: Oriented X4 Cognition Arousal/Alertness: Awake/alert Behavior During Therapy: WFL for tasks assessed/performed Overall Cognitive Status: Impaired/Different from baseline Area of Impairment: Memory, Following commands, Problem solving, Safety/judgement Memory: Decreased recall of precautions Following Commands: Follows one step commands with increased  time Safety/Judgement: Decreased awareness of safety Problem Solving: Requires verbal cues, Requires tactile cues General Comments: Required cues throughout session to maintain post hip precautions during ADLs and funcitonal mobility  Blood pressure 137/67, pulse 89, temperature 99.1 F (37.3 C), temperature source Oral, resp. rate 18, height 6\' 3"  (1.905 m), weight 123.5 kg (272 lb 4.8 oz), SpO2 98 %. Physical Exam  Vitals reviewed. Constitutional: He is oriented to person, place, and time. He appears well-developed and well-nourished.  HENT:  Head: Normocephalic and atraumatic.  Eyes: Conjunctivae and EOM are normal. Right eye exhibits no discharge. Left eye exhibits no discharge.  Neck: Normal range of motion. Neck supple. No thyromegaly present.  Cardiovascular: Normal rate, regular rhythm and normal heart sounds.   Respiratory: Effort normal and breath sounds normal. No respiratory distress.  GI: Soft. Bowel sounds are normal. He exhibits no distension.  Musculoskeletal: He exhibits edema and tenderness.  Neurological: He is alert and oriented to person, place, and time.  Motor: B/l UE 5/5 proximal to distal LLE: HF 4/5, distally 5/5 RLE: HF 2/5 (pain inhibition), ADF/PF 5/5  Skin: Skin is warm and dry.  Hip incision is dressed with abduction brace in place  Psychiatric: He has a normal mood and affect. His behavior is normal.    Results for orders placed or performed during the hospital encounter of 09/26/16 (from the past 24 hour(s))  CBC     Status: Abnormal   Collection Time: 09/30/16  6:03 AM  Result Value Ref Range   WBC 8.0 4.0 - 10.5 K/uL   RBC 2.75 (L) 4.22 - 5.81 MIL/uL   Hemoglobin 8.5 (L) 13.0 - 17.0 g/dL   HCT 16.1 (L) 09.6 - 04.5 %   MCV 96.7 78.0 - 100.0 fL   MCH 30.9 26.0 - 34.0 pg   MCHC 32.0 30.0 - 36.0 g/dL   RDW 40.9 81.1 - 91.4 %   Platelets 225 150 - 400 K/uL   No results found.  Assessment/Plan: Diagnosis: Right hip periprosthetic fracture Labs  and images independently reviewed.  Records reviewed and summated above.  1. Does the need for close, 24 hr/day medical supervision in concert with the patient's rehab needs make it unreasonable for this patient to be served in a less intensive setting? Yes 2. 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 Due to bowel management, safety, skin/wound care, disease management, pain management and patient education, does the patient require 24 hr/day rehab nursing? Yes 4. Does the patient require coordinated care of a physician, rehab nurse, PT (1-2 hrs/day, 5 days/week) and OT (1-2 hrs/day, 5 days/week) to address physical and functional deficits in the context of the above medical diagnosis(es)? Yes Addressing deficits in the following areas: balance, endurance, locomotion, strength, transferring, bowel/bladder control, bathing, dressing, toileting and psychosocial support 5. Can the patient actively participate in an intensive therapy program of at least 3 hrs of therapy per day at least 5 days per week? Yes 6. The potential  for patient to make measurable gains while on inpatient rehab is excellent 7. Anticipated functional outcomes upon discharge from inpatient rehab are modified independent  with PT, modified independent and supervision with OT, n/a with SLP. 8. Estimated rehab length of stay to reach the above functional goals is: 12-16 days. 9. Anticipated D/C setting: Home 10. Anticipated post D/C treatments: HH therapy and Home excercise program 11. Overall Rehab/Functional Prognosis: excellent  RECOMMENDATIONS: This patient's condition is appropriate for continued rehabilitative care in the following setting: CIR today. Patient has agreed to participate in recommended program. Yes Note that insurance prior authorization may be  required for reimbursement for recommended care.  Comment: Rehab Admissions Coordinator to follow up.  Maryla Morrow, MD, [place] Dom Charlton Amor., PA-C 09/30/2016

## 2016-09-30 NOTE — IPOC Note (Signed)
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   Chronic bilateral low back pain   Hyperglycemia   Hypoalbuminemia due to protein-calorie malnutrition (HCC)   Benign prostatic hyperplasia   Constipation due to pain medication     Functional Problem List: Nursing Edema, Pain, Skin Integrity  PT Balance, Motor, Pain, Safety  OT Balance, Pain  SLP    TR         Basic ADL's: OT Grooming, Bathing, Dressing, Toileting     Advanced  ADL's: OT Simple Meal Preparation, Laundry, Light Housekeeping     Transfers: PT Bed Mobility, Bed to Chair, Car, Furniture, Floor  OT Tub/Shower, Technical brewer: PT Ambulation, Psychologist, prison and probation services, Stairs     Additional Impairments: OT None  SLP        TR      Anticipated Outcomes Item Anticipated Outcome  Self Feeding independent  Swallowing      Basic self-care  modified independent  Toileting  modified independent   Bathroom Transfers modified independent  Bowel/Bladder  For patient to continue to be continent.  Transfers  mod I with LRAD  Locomotion  ambulatory mod I with LRAD  Communication     Cognition     Pain  To continue to manage patient's chronic pain with medication and positioning.  Safety/Judgment  Patient will continue to be cooperative with fall saftey guidlines.   Therapy Plan: PT Intensity: Minimum of 1-2 x/day ,45 to 90 minutes PT Frequency: 5 out of 7 days PT Duration Estimated Length of Stay: 5-7 days OT Intensity: Minimum of 1-2 x/day, 45 to 90 minutes OT Frequency: 5 out of 7 days OT Duration/Estimated Length of Stay: 5- 7 days         Team Interventions: Nursing Interventions Patient/Family Education, Pain Management, Skin Care/Wound Management, Psychosocial Support, Discharge Planning, Medication  Management  PT interventions Ambulation/gait training, Balance/vestibular training, Cognitive remediation/compensation, Community reintegration, Discharge planning, Disease management/prevention, DME/adaptive equipment instruction, Functional electrical stimulation, Functional mobility training, Neuromuscular re-education, Pain management, Patient/family education, Psychosocial support, Stair training, Therapeutic Activities, Therapeutic Exercise, UE/LE Strength taining/ROM, UE/LE Coordination activities  OT Interventions Balance/vestibular training, Cognitive remediation/compensation, DME/adaptive equipment instruction, Discharge planning, Pain management, Self Care/advanced ADL retraining, Therapeutic Activities, UE/LE Coordination activities, Therapeutic Exercise, Patient/family education, Functional mobility training, Neuromuscular re-education, UE/LE Strength taining/ROM  SLP Interventions    TR Interventions    SW/CM Interventions Discharge Planning, Psychosocial Support, Patient/Family Education    Team Discharge Planning: Destination: PT-Home ,OT- Home , SLP-  Projected Follow-up: PT-Home health PT, 24 hour supervision/assistance, OT-  None, SLP-  Projected Equipment Needs: PT-None recommended by PT, OT- 3 in 1 bedside comode, Tub/shower bench, SLP-  Equipment Details: PT-Pt reports having rolling walker, manual w/c, power w/c at home. , OT-  Patient/family involved in discharge planning: PT- Patient,  OT-Patient, SLP-   MD ELOS: 5-7 days. 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 Wife has had a stroke in the past and is "useless" per pt. There is also a niece in the home who can assist.Presented 09/27/2016 after a fall and inability to bear weight on the right leg. X-rays and imaging revealed right hip periprosthetic fracture. Underwent ORIF  09/27/2016 per Dr. Linna Caprice. Hospital course pain management. Partial weightbearing  right lower extremity of 30 pounds. Fitted with hip abduction brace as well as posterior hip precautions. Acute blood loss anemia monitored. Placed on Eliquisfor DVT prophylaxis. Pt with resulting dificits with mobility, transfers, and self-care.  Will set goals for Mod I with PT/OT  See Team Conference Notes for weekly updates to the plan of care

## 2016-09-30 NOTE — Progress Notes (Signed)
Discharging patient to inpatient rehab. Left unit via wheelchair accompanied by Nursing Technicians from 5N. AVS not printed per notice not to.

## 2016-09-30 NOTE — Progress Notes (Signed)
   Subjective:  Patient reports pain as mild to moderate.  C/o of R hip pain with mobilization.  Objective:   VITALS:   Vitals:   09/29/16 1507 09/29/16 1852 09/29/16 1900 09/30/16 0514  BP: 130/61  138/89 137/67  Pulse: 90  88 89  Resp: 16  16 18   Temp: 99.3 F (37.4 C)  99.1 F (37.3 C) 99.1 F (37.3 C)  TempSrc: Oral  Oral Oral  SpO2: 97%  97% 98%  Weight:  123.5 kg (272 lb 4.8 oz)    Height:  6\' 3"  (1.905 m)     NAD ABD soft Sensation intact distally Intact pulses distally Dorsiflexion/Plantar flexion intact Incision: dressing C/D/I Compartment soft Hip abduction brace in place   Lab Results  Component Value Date   WBC 8.0 09/30/2016   HGB 8.5 (L) 09/30/2016   HCT 26.6 (L) 09/30/2016   MCV 96.7 09/30/2016   PLT 225 09/30/2016   BMET    Component Value Date/Time   NA 134 (L) 09/29/2016 0538   K 4.1 09/29/2016 0538   CL 96 (L) 09/29/2016 0538   CO2 29 09/29/2016 0538   GLUCOSE 114 (H) 09/29/2016 0538   BUN 12 09/29/2016 0538   CREATININE 1.25 (H) 09/29/2016 0538   CALCIUM 8.8 (L) 09/29/2016 0538   GFRNONAA >60 09/29/2016 0538   GFRAA >60 09/29/2016 0538     Assessment/Plan: 3 Days Post-Op   Principal Problem:   Periprosthetic fracture around internal prosthetic right hip joint (HCC) Active Problems:   Periprosthetic fracture around internal prosthetic hip joint, initial encounter   TDWB RLE with walker Posterior hip precautions Hip abduction brace at all times DVT ppx: apixaban, SCDs, TEDs PO pain control PT/OT Dispo: D/C home with HHPT   Bernell Sigal, Cloyde ReamsBrian James 09/30/2016, 7:56 AM   Samson FredericBrian Essa Malachi, MD Cell [phone]

## 2016-10-01 ENCOUNTER — Encounter (HOSPITAL_COMMUNITY): Payer: Medicare Other

## 2016-10-01 ENCOUNTER — Inpatient Hospital Stay (HOSPITAL_COMMUNITY): Payer: Non-veteran care | Admitting: Occupational Therapy

## 2016-10-01 ENCOUNTER — Inpatient Hospital Stay (HOSPITAL_COMMUNITY): Payer: Non-veteran care

## 2016-10-01 ENCOUNTER — Inpatient Hospital Stay (HOSPITAL_COMMUNITY): Payer: Medicare Other | Admitting: Physical Therapy

## 2016-10-01 ENCOUNTER — Encounter (HOSPITAL_COMMUNITY): Payer: Self-pay

## 2016-10-01 DIAGNOSIS — N400 Enlarged prostate without lower urinary tract symptoms: Secondary | ICD-10-CM

## 2016-10-01 DIAGNOSIS — R739 Hyperglycemia, unspecified: Secondary | ICD-10-CM

## 2016-10-01 DIAGNOSIS — M7989 Other specified soft tissue disorders: Secondary | ICD-10-CM

## 2016-10-01 DIAGNOSIS — M545 Low back pain, unspecified: Secondary | ICD-10-CM

## 2016-10-01 DIAGNOSIS — E8809 Other disorders of plasma-protein metabolism, not elsewhere classified: Secondary | ICD-10-CM

## 2016-10-01 DIAGNOSIS — E46 Unspecified protein-calorie malnutrition: Secondary | ICD-10-CM

## 2016-10-01 DIAGNOSIS — D62 Acute posthemorrhagic anemia: Secondary | ICD-10-CM

## 2016-10-01 DIAGNOSIS — K5903 Drug induced constipation: Secondary | ICD-10-CM

## 2016-10-01 DIAGNOSIS — G8929 Other chronic pain: Secondary | ICD-10-CM

## 2016-10-01 LAB — CBC WITH DIFFERENTIAL/PLATELET
Basophils Absolute: 0 10*3/uL (ref 0.0–0.1)
Basophils Relative: 0 %
Eosinophils Absolute: 0.4 10*3/uL (ref 0.0–0.7)
Eosinophils Relative: 5 %
HEMATOCRIT: 25.9 % — AB (ref 39.0–52.0)
Hemoglobin: 8.2 g/dL — ABNORMAL LOW (ref 13.0–17.0)
Lymphocytes Relative: 20 %
Lymphs Abs: 1.5 10*3/uL (ref 0.7–4.0)
MCH: 30.7 pg (ref 26.0–34.0)
MCHC: 31.7 g/dL (ref 30.0–36.0)
MCV: 97 fL (ref 78.0–100.0)
Monocytes Absolute: 0.6 10*3/uL (ref 0.1–1.0)
Monocytes Relative: 9 %
NEUTROS ABS: 4.7 10*3/uL (ref 1.7–7.7)
Neutrophils Relative %: 66 %
PLATELETS: 246 10*3/uL (ref 150–400)
RBC: 2.67 MIL/uL — AB (ref 4.22–5.81)
RDW: 14 % (ref 11.5–15.5)
WBC: 7.2 10*3/uL (ref 4.0–10.5)

## 2016-10-01 LAB — COMPREHENSIVE METABOLIC PANEL
ALT: 25 U/L (ref 17–63)
AST: 50 U/L — AB (ref 15–41)
Albumin: 2.9 g/dL — ABNORMAL LOW (ref 3.5–5.0)
Alkaline Phosphatase: 55 U/L (ref 38–126)
Anion gap: 7 (ref 5–15)
BILIRUBIN TOTAL: 0.7 mg/dL (ref 0.3–1.2)
BUN: 9 mg/dL (ref 6–20)
CHLORIDE: 98 mmol/L — AB (ref 101–111)
CO2: 32 mmol/L (ref 22–32)
CREATININE: 0.93 mg/dL (ref 0.61–1.24)
Calcium: 8.7 mg/dL — ABNORMAL LOW (ref 8.9–10.3)
GFR calc Af Amer: >60 mL/min (ref >60)
GLUCOSE: 101 mg/dL — AB (ref 65–99)
Potassium: 4.3 mmol/L (ref 3.5–5.1)
Sodium: 137 mmol/L (ref 135–145)
Total Protein: 5.6 g/dL — ABNORMAL LOW (ref 6.5–8.1)

## 2016-10-01 MED ORDER — METHOCARBAMOL 750 MG PO TABS
750.0000 mg | ORAL_TABLET | Freq: Four times a day (QID) | ORAL | Status: DC | PRN
  Administered 2016-10-03 – 2016-10-06 (×8): 750 mg via ORAL
  Filled 2016-10-01 (×8): qty 1

## 2016-10-01 MED ORDER — DIPHENHYDRAMINE HCL 25 MG PO CAPS
50.0000 mg | ORAL_CAPSULE | Freq: Every evening | ORAL | Status: DC | PRN
  Administered 2016-10-02 – 2016-10-05 (×4): 50 mg via ORAL
  Filled 2016-10-01 (×4): qty 2

## 2016-10-01 MED ORDER — PRO-STAT SUGAR FREE PO LIQD
30.0000 mL | Freq: Two times a day (BID) | ORAL | Status: DC
  Administered 2016-10-01 – 2016-10-06 (×11): 30 mL via ORAL
  Filled 2016-10-01 (×11): qty 30

## 2016-10-01 MED ORDER — OXYCODONE HCL 5 MG PO TABS: 15.0000 mg | ORAL_TABLET | Freq: Four times a day (QID) | ORAL | Status: DC

## 2016-10-01 MED ORDER — HYDROCODONE-ACETAMINOPHEN 5-325 MG PO TABS: 1.0000 | ORAL_TABLET | Freq: Four times a day (QID) | ORAL | Status: DC | PRN

## 2016-10-01 MED ORDER — OXYCODONE HCL 5 MG PO TABS
15.0000 mg | ORAL_TABLET | ORAL | Status: DC | PRN
  Administered 2016-10-01 – 2016-10-05 (×22): 15 mg via ORAL
  Filled 2016-10-01 (×22): qty 3

## 2016-10-01 NOTE — Progress Notes (Signed)
Occupational Therapy Session Note  Patient Details  Name: Russell Steele MRN: [id number] Date of Birth: 01/16/1956  Today's Date: 10/01/2016 OT Individual Time: 1130-1158 OT Individual Time Calculation (min): 28 min    Short Term Goals: No short term goals set  Skilled Therapeutic Interventions/Progress Updates:    Treatment session with focus on education regarding AE to assist with LB dressing.  Pt able to verbalize posterior hip precautions in context with LB dressing.  Therapist demonstrated use of reacher and sock aid with pt able to return demonstration with increased time to remove sock on RLE due to pain.  Discussed OT POC and rehab process with pt reporting understanding.   Therapy Documentation Precautions:  Restrictions Weight Bearing Restrictions: Yes RLE Weight Bearing: Partial weight bearing RLE Partial Weight Bearing Percentage or Pounds: 30 Pain: Pain Assessment Pain Assessment: 0-10 Pain Score: 4  Pain Type: Acute pain;Surgical pain Pain Location: Leg Pain Orientation: Right Pain Descriptors / Indicators: Aching;Sharp Pain Frequency: Constant Pain Onset: On-going Patients Stated Pain Goal: 4 Pain Intervention(s): Medication (See eMAR)  See Function Navigator for Current Functional Status.   Therapy/Group: Individual Therapy  Rosalio LoudHOXIE, Baylie Drakes 10/01/2016, 12:18 PM

## 2016-10-01 NOTE — Progress Notes (Signed)
[facility] PHYSICAL MEDICINE & REHABILITATION     PROGRESS NOTE  Subjective/Complaints:  Pt seen sitting up in bed this AM.  He states he did not sleep well.  He is upset that the light will not turn off in his room.  He is also upset his pain meds were changed.    ROS: Denies CP, SOB, N/V/D.  Objective: Vital Signs: Blood pressure 121/65, pulse 87, temperature 99.1 F (37.3 C), temperature source Oral, resp. rate 18, height 6\' 3"  (1.905 m), weight 118.5 kg (261 lb 3.2 oz), SpO2 96 %. No results found.  Recent Labs  09/30/16 0603 10/01/16 0557  WBC 8.0 7.2  HGB 8.5* 8.2*  HCT 26.6* 25.9*  PLT 225 246    Recent Labs  09/29/16 0538 10/01/16 0557  NA 134* 137  K 4.1 4.3  CL 96* 98*  GLUCOSE 114* 101*  BUN 12 9  CREATININE 1.25* 0.93  CALCIUM 8.8* 8.7*   CBG (last 3)  No results for input(s): GLUCAP in the last 72 hours.  Wt Readings from Last 3 Encounters:  09/30/16 118.5 kg (261 lb 3.2 oz)  09/29/16 123.5 kg (272 lb 4.8 oz)  08/10/16 119.4 kg (263 lb 4 oz)    Physical Exam:  BP 121/65 (BP Location: Right Arm)   Pulse 87   Temp 99.1 F (37.3 C) (Oral)   Resp 18   Ht 6\' 3"  (1.905 m)   Wt 118.5 kg (261 lb 3.2 oz)   SpO2 96%   BMI 32.65 kg/m  Constitutional: He appears well-developed and well-nourished.  HENT: Normocephalic and atraumatic.  Eyes: EOMI. No discharge.  Cardiovascular: Normal rate, regular rhythm. No JVD Respiratory: Effort normal and breath sounds normal GI: Soft. Bowel sounds are normal.  Musculoskeletal: He exhibits edema and tenderness.  Neurological: He is alert and oriented.  Motor: B/l UE 5/5 proximal to distal LLE: HF 4/5, distally 5/5 RLE: HF 2/5 (pain inhibition, unchanged), ADF/PF 5/5  Skin: Hip incision dressed clean and dry with abduction brace in place  Psychiatric: Easily agitated.  Assessment/Plan: 1. Functional deficits secondary to right hip periprosthetic fracture status post ORIF which require 3+ hours per day of  interdisciplinary therapy in a comprehensive inpatient rehab setting. Physiatrist is providing close team supervision and 24 hour management of active medical problems listed below. Physiatrist and rehab team continue to assess barriers to discharge/monitor patient progress toward functional and medical goals.  Function:  Bathing Bathing position      Bathing parts      Bathing assist        Upper Body Dressing/Undressing Upper body dressing                    Upper body assist        Lower Body Dressing/Undressing Lower body dressing                                  Lower body assist        Toileting Toileting          Toileting assist     Transfers Chair/bed transfer             Locomotion Ambulation           Wheelchair          Cognition Comprehension Comprehension assist level: Follows complex conversation/direction with extra time/assistive device  Expression Expression assist level: Expresses complex  ideas: With extra time/assistive device  Social Interaction Social Interaction assist level: Interacts appropriately with others with medication or extra time (anti-anxiety, antidepressant).  Problem Solving Problem solving assist level: Solves complex problems: Recognizes & self-corrects  Memory Memory assist level: Complete Independence: No helper    Medical Problem List and Plan: 1.  Decreased functional mobility secondary to right hip periprosthetic fracture status post ORIF. Partial weightbearing 30 pounds with recent right total hip arthroplasty 08/10/2016 with posterior hip precautions  Begin CIR 2.  DVT Prophylaxis/Anticoagulation: Eliquis.   Vascular study pending 3. Pain Management/chronic back pain: Neurontin 600 mg 3 times a day, Hydrocodone as needed  Started Roxi 15 q6, per pt home dose for chronic back pain from Dr. Angelina Ok 4. Mood: Prozac 20 mg daily, BuSpar 15 mg 4 times a day 5. Neuropsych: This patient is  capable of making decisions on his own behalf. 6. Skin/Wound Care: Routine skin checks 7. Fluids/Electrolytes/Nutrition: Routine I&Os   BMP within acceptable range on 5/31 8. Acute blood loss anemia.   Hb 8.2 on 5/31  Cont to monitor 9. Hyperlipidemia. Zocor 10. BPH. Flomax  Monitor for retention 11. History of tobacco abuse. Counseling 12. Constipation. Laxative assistance 13. Hyperglycemia  Likely stress induced  Monitor with increased activity 14. Hypoalbuminemia  Supplement initiated 5/31  LOS (Days) 1 A FACE TO FACE EVALUATION WAS PERFORMED  Ankit Karis Juba 10/01/2016 8:22 AM

## 2016-10-01 NOTE — Progress Notes (Signed)
*  PRELIMINARY RESULTS* Vascular Ultrasound Bilateral lower extremity vensous duplex has been completed.  Preliminary findings: No evidence of deep vein thrombosis in the visualized veins of the lower extremities.  Negative for baker's cysts.   Russell FischerCharlotte C Loma Steele 10/01/2016, 4:28 PM

## 2016-10-01 NOTE — Progress Notes (Signed)
Occupational Therapy Session Note  Patient Details  Name: Russell Steele MRN: [id number] Date of Birth: 07/24/1955  Today's Date: 10/01/2016 OT Individual Time: [id number] OT Individual Time Calculation (min): 47 min    Short Term Goals: Week 1:  OT Short Term Goal 1 (Week 1): STGs equal to LTGs set at modified independent level.  Skilled Therapeutic Interventions/Progress Updates:  Pt worked on tub/shower transfers from wheelchair to tub bench with supervision using the RW.  He was unable to lift the RLE into our practice tub secondary to not having enough room to bring it in and maintain hip precautions.  He reports having a garden shower/tub at home for use and feels tub bench should work fine.  Also had pt practice bed transfer to regular bed, which he was able to complete with supervision sit to supine and supine to sit.  He was able to manage his RLE with use of UEs to provide enough lift to bring it up on top of his bed.  Once finished had pt transfer back to his room via wheelchair and then back to his hospital bed secondary to going down for vascular study.   Nursing in room with call button and phone in reach.     Therapy Documentation Precautions:  Precautions Precautions: Posterior Hip, Fall Precaution Booklet Issued: Yes (comment) Precaution Comments: precautions provided, reviewed and posted Required Braces or Orthoses: Other Brace/Splint Other Brace/Splint: hip abduction brace Restrictions Weight Bearing Restrictions: Yes RLE Weight Bearing: Partial weight bearing RLE Partial Weight Bearing Percentage or Pounds: 30%  Pain: Pain Assessment Pain Assessment: 0-10 Pain Score: 7  Pain Type: Acute pain Pain Location: Leg Pain Orientation: Right Pain Descriptors / Indicators: Discomfort Pain Frequency: Constant Pain Onset: With Activity Patients Stated Pain Goal: 4 Pain Intervention(s): Repositioned ADL: See Function Navigator for Current Functional  Status.   Therapy/Group: Individual Therapy  Emberleigh Reily OTR/L 10/01/2016, 5:01 PM

## 2016-10-01 NOTE — Care Management Note (Signed)
[facility] Individual Statement of Services  Patient Name:  Russell Steele  Date:  10/01/2016  Welcome to the [facility].  Our goal is to provide you with an individualized program based on your diagnosis and situation, designed to meet your specific needs.  With this comprehensive rehabilitation program, you will be expected to participate in at least 3 hours of rehabilitation therapies Monday-Friday, with modified therapy programming on the weekends.  Your rehabilitation program will include the following services:  Physical Therapy (PT), Occupational Therapy (OT), 24 hour per day rehabilitation nursing, Therapeutic Recreaction (TR), Case Management (Social Worker), Rehabilitation Medicine, Nutrition Services and Pharmacy Services  Weekly team conferences will be held on Wednesday to discuss your progress.  Your Social Worker will talk with you frequently to get your input and to update you on team discussions.  Team conferences with you and your family in attendance may also be held.  Expected length of stay: 5-7 Days Overall anticipated outcome: mod/i level  Depending on your progress and recovery, your program may change. Your Social Worker will coordinate services and will keep you informed of any changes. Your Social Worker's name and contact numbers are listed  below.  The following services may also be recommended but are not provided by the [facility]:   Driving Evaluations  Home Health Rehabiltiation Services  Outpatient Rehabilitation Services    Arrangements will be made to provide these services after discharge if needed.  Arrangements include referral to agencies that provide these services.  Your insurance has been verified to be:  Medic[place] and [place]  [facility]  Pertinent information will be shared with your doctor and your insurance company.  Social Worker:  Dossie DerBecky Danen Lapaglia, SW [phone]  or [phone]  Information discussed with and copy given to patient by: Lucy Chrisupree, Mayline Dragon G, 10/01/2016, 1:43 PM

## 2016-10-01 NOTE — Progress Notes (Signed)
Physical Therapy Assessment and Plan  Patient Details  Name: Russell Steele MRN: [id number] Date of Birth: 1956/02/27  PT Diagnosis: Abnormality of gait, Difficulty walking, Muscle weakness and Pain in Rt LE Rehab Potential: Good ELOS: 5-7 days   Today's Date: 10/01/2016 PT Individual Time: 1448-1856 PT Individual Time Calculation (min): 73 min    Problem List:  Patient Active Problem List   Diagnosis Date Noted  . Chronic bilateral low back pain   . Hyperglycemia   . Hypoalbuminemia due to protein-calorie malnutrition ([place])   . Benign prostatic hyperplasia   . Constipation due to pain medication   . Periprosthetic fracture around internal prosthetic hip joint 09/30/2016  . Adjustment disorder with mixed anxiety and depressed mood   . Tobacco abuse   . OSA (obstructive sleep apnea)   . History of right hip replacement   . Post-operative pain   . Acute blood loss anemia   . Stage 3 chronic kidney disease   . Periprosthetic fracture around internal prosthetic hip joint, initial encounter 09/27/2016  . Periprosthetic fracture around internal prosthetic right hip joint ([place]) 09/26/2016  . Avascular necrosis of hip, right ([place]) 08/10/2016    Past Medical History:  Past Medical History:  Diagnosis Date  . Anxiety   . Arthritis   . Depression   . History of kidney stones   . Sleep apnea    Past Surgical History:  Past Surgical History:  Procedure Laterality Date  . AMPUTATION TOE    . APPENDECTOMY    . BACK SURGERY    . DENTAL SURGERY    . KNEE ARTHROSCOPY Bilateral 2017  . ORIF FEMUR FRACTURE Right 09/26/2016  . ORIF PERIPROSTHETIC FRACTURE Right 09/27/2016   Procedure: OPEN REDUCTION INTERNAL FIXATION (ORIF) PERIPROSTHETIC FRACTURE;  Surgeon: Rod Can, MD;  Location: [facility];  Service: Orthopedics;  Laterality: Right;  Femeral Component Rev, DEPUY AML Stem, Zimmer Cables, LATERAL POSITION WITH MARK II. POSTERIOR APPROACH.  . TOTAL HIP ARTHROPLASTY Right 08/10/2016    Procedure: RIGHT TOTAL HIP ARTHROPLASTY ANTERIOR APPROACH;  Surgeon: Rod Can, MD;  Location: [facility];  Service: Orthopedics;  Laterality: Right;  . TOTAL HIP ARTHROPLASTY Right 09/27/2016   Procedure: TOTAL HIP ARTHROPLASTY revision of femoral component;  Surgeon: Rod Can, MD;  Location: [facility];  Service: Orthopedics;  Laterality: Right;    Assessment & Plan Clinical Impression: Patient is a 61 y.o.right handed malewith history of anxiety, depression,tobacco abuse,sleep apnea, status post right total hip arthroplasty 08/10/2016. Patient lives with spouse. Independent prior to total hip replacement. One level home with ramped entrance. History taken from patient and chart review. Wife has had a stroke in the past and is "useless" per pt. There is also a niece in the home who can assist.Presented 09/27/2016 after a fall and inability to bear weight on the right leg. X-rays and imaging revealed right hip periprosthetic fracture. Underwent ORIF 09/27/2016 per Dr. Lyla Glassing. Hospital course pain management. Partial weightbearing right lower extremity of 30 pounds. Fitted with hip abduction brace as well as posterior hip precautions. Acute blood loss anemia 8.5 and monitor. Placed on Eliquisfor DVT prophylaxis. Physical occupational therapy evaluations completed with recommendations of physical medicine rehabilitation consult. Patient transferred to CIR on 09/30/2016 .   Patient currently requires supervision-min assist with mobility secondary to muscle weakness, decreased safety awareness and difficulty maintaining precautions.  Prior to hospitalization, patient was independent  with mobility and lived with Spouse in a House home.  Home access is  Ramped entrance.  Patient  will benefit from skilled PT intervention to maximize safe functional mobility and minimize fall risk for planned discharge home with intermittent assist.  Anticipate patient will benefit from follow up [facility] at discharge.  PT  - End of Session Activity Tolerance: Tolerates 30+ min activity with multiple rests Endurance Deficit: No PT Assessment Rehab Potential (ACUTE/IP ONLY): Good Barriers to Discharge: Decreased caregiver support PT Patient demonstrates impairments in the following area(s): Balance;Motor;Pain;Safety PT Transfers Functional Problem(s): Bed Mobility;Bed to Chair;Car;Furniture;Floor PT Locomotion Functional Problem(s): Ambulation;Wheelchair Mobility;Stairs PT Plan PT Intensity: Minimum of 1-2 x/day ,45 to 90 minutes PT Frequency: 5 out of 7 days PT Duration Estimated Length of Stay: 5-7 days PT Treatment/Interventions: Ambulation/gait training;Balance/vestibular training;Cognitive remediation/compensation;Community reintegration;Discharge planning;Disease management/prevention;DME/adaptive equipment instruction;Functional electrical stimulation;Functional mobility training;Neuromuscular re-education;Pain management;Patient/family education;Psychosocial support;Stair training;Therapeutic Activities;Therapeutic Exercise;UE/LE Strength taining/ROM;UE/LE Coordination activities PT Transfers Anticipated Outcome(s): mod I with LRAD PT Locomotion Anticipated Outcome(s): ambulatory mod I with LRAD PT Recommendation Follow Up Recommendations: Home health PT;24 hour supervision/assistance Patient destination: Home Equipment Recommended: None recommended by PT Equipment Details: Pt reports having rolling walker, manual w/c, power w/c at home.   Skilled Therapeutic Intervention Pt in bed upon arrival, agreeable to PT session but requesting to use bathroom. PT leaving to get BSC to elevate toilet height, upon return pt up in bathroom with nursing assistance. Cues needed with standing from toilet to maintain posterior hip precautions. Upon completion of using bathroom, pt willing to continue with PT session. Pt mobilizing at primarily supervision level but requiring occasional min assist for safety or with Rt LE.  Pt ambulating with rw and using w/c for longer distance. Pt states that he has all needed DME at home already. Pt also states that his wife will not be able to provide any assistance at home upon D/C. His niece is staying with her currently but he is uncertain how long she will be able to stay. Discussed goal of maximizing mobility and safety in anticipation of D/C to home. Pt is in agreement. Also reviewed posterior hip precautions which the pt was unable to recall. Additionally reinforced safety on unit and need to call for assistance. Pt verbalized understanding and agreement. Pt up in w/c following session with all needs in reach.   PT Evaluation Precautions/Restrictions Precautions Precautions: Posterior Hip;Fall Precaution Booklet Issued: Yes (comment) Precaution Comments: precautions provided, reviewed and posted Required Braces or Orthoses: Other Brace/Splint Other Brace/Splint: hip abduction brace Restrictions Weight Bearing Restrictions: Yes RLE Weight Bearing: Partial weight bearing RLE Partial Weight Bearing Percentage or Pounds: 30% Pain Pain Assessment Pain Assessment: 0-10 Pain Score: 7  Pain Type: Acute pain Pain Location: Leg Pain Orientation: Right  Home Living/Prior Functioning Home Living Living Arrangements: Spouse/significant other;Other relatives Available Help at Discharge: Available 24 hours/day (neice at home now assisting with spouse) Type of Home: House Home Access: Ramped entrance Home Layout: One level Bathroom Shower/Tub: Tub/shower unit;Curtain Bathroom Toilet: Handicapped height Bathroom Accessibility: No Additional Comments: unsure how long niece will be around to assist. Planning to sleep in lift chair. States that his spouse is unable to provide any assistance at home.   Lives With: Spouse Prior Function Level of Independence: Independent with basic ADLs Driving: Yes Vocation: On disability Leisure: Hobbies-no Comments: enjoys his  dogs Vision/Perception  Vision - History Patient Visual Report: No change from baseline Vision - Assessment Eye Alignment: Within Functional Limits Perception Perception: Within Functional Limits Praxis Praxis: Intact  Cognition Overall Cognitive Status: Within Functional Limits for tasks assessed Arousal/Alertness: Awake/alert Orientation Level: Oriented  X4 Attention: Sustained;Selective Sustained Attention: Appears intact Selective Attention: Appears intact Memory: Impaired (Initially had difficulty stating hip precautions) Memory Impairment: Decreased recall of new information Awareness: Appears intact Problem Solving: Appears intact Safety/Judgment: Impaired Comments: Pt needed cueing for correct safe completion of transfers using the RW and attempted to reach down to his lower leg, breaking his 90 degree hip precaution before therapist cued him not to.   Sensation Sensation Light Touch: Appears Intact Stereognosis: Appears Intact Hot/Cold: Appears Intact Proprioception: Appears Intact Additional Comments: Pt with slight numbness in the lateral aspect of the 5th digit of the left hand secondary to ulnar nerve reposiitoning surgery previously. Coordination Gross Motor Movements are Fluid and Coordinated: Yes Fine Motor Movements are Fluid and Coordinated: Yes Motor  Motor Motor: Within Functional Limits Motor - Skilled Clinical Observations: Decreased Rt LE strength due to reports of pain.   Mobility Bed Mobility Bed Mobility: Supine to Sit Supine to Sit: 5: Supervision;HOB flat;With rails Transfers Sit to Stand: 5: Supervision;From bed;With upper extremity assist;From chair/3-in-1 Stand to Sit: 5: Supervision;With upper extremity assist;To bed;To chair/3-in-1 Locomotion     Trunk/Postural Assessment  Cervical Assessment Cervical Assessment: Within Functional Limits Thoracic Assessment Thoracic Assessment: Within Functional Limits Lumbar Assessment Lumbar  Assessment: Within Functional Limits Postural Control Postural Control: Within Functional Limits  Balance Balance Balance Assessed: Yes Static Sitting Balance Static Sitting - Balance Support: No upper extremity supported Static Sitting - Level of Assistance: 5: Stand by assistance Dynamic Sitting Balance Dynamic Sitting - Balance Support: No upper extremity supported Dynamic Sitting - Level of Assistance: 5: Stand by assistance Static Standing Balance Static Standing - Balance Support: Bilateral upper extremity supported Static Standing - Level of Assistance: 5: Stand by assistance Dynamic Standing Balance Dynamic Standing - Balance Support: Bilateral upper extremity supported Dynamic Standing - Level of Assistance: 5: Stand by assistance Extremity Assessment  RUE Assessment RUE Assessment: Within Functional Limits LUE Assessment LUE Assessment: Within Functional Limits RLE Assessment RLE Assessment: Exceptions to [facility] RLE Strength RLE Overall Strength Comments: decreased generalized strength due to reports of pain. Knee extension 3+/5. Pt unable to lift LE against gravity for functional tasks.  LLE Assessment LLE Assessment: Within Functional Limits   See Function Navigator for Current Functional Status.   Refer to Care Plan for Long Term Goals  Recommendations for other services: None   Discharge Criteria: Patient will be discharged from PT if patient refuses treatment 3 consecutive times without medical reason, if treatment goals not met, if there is a change in medical status, if patient makes no progress towards goals or if patient is discharged from hospital.  The above assessment, treatment plan, treatment alternatives and goals were discussed and mutually agreed upon: by patient  Linard Millers, PT 10/01/2016, 4:48 PM

## 2016-10-01 NOTE — Evaluation (Signed)
Occupational Therapy Assessment and Plan  Patient Details  Name: Russell Steele MRN: [id number] Date of Birth: October 14, 1955  OT Diagnosis: abnormal posture, cognitive deficits, muscle weakness (generalized) and pain in joint Rehab Potential: Rehab Potential (ACUTE ONLY): Good ELOS: 5- 7 days   Today's Date: 10/01/2016 OT Individual Time: 1002-1105 OT Individual Time Calculation (min): 63 min     Problem List:  Patient Active Problem List   Diagnosis Date Noted  . Chronic bilateral low back pain   . Hyperglycemia   . Hypoalbuminemia due to protein-calorie malnutrition ([place])   . Benign prostatic hyperplasia   . Constipation due to pain medication   . Periprosthetic fracture around internal prosthetic hip joint 09/30/2016  . Adjustment disorder with mixed anxiety and depressed mood   . Tobacco abuse   . OSA (obstructive sleep apnea)   . History of right hip replacement   . Post-operative pain   . Acute blood loss anemia   . Stage 3 chronic kidney disease   . Periprosthetic fracture around internal prosthetic hip joint, initial encounter 09/27/2016  . Periprosthetic fracture around internal prosthetic right hip joint ([place]) 09/26/2016  . Avascular necrosis of hip, right ([place]) 08/10/2016    Past Medical History:  Past Medical History:  Diagnosis Date  . Anxiety   . Arthritis   . Depression   . History of kidney stones   . Sleep apnea    Past Surgical History:  Past Surgical History:  Procedure Laterality Date  . AMPUTATION TOE    . APPENDECTOMY    . BACK SURGERY    . DENTAL SURGERY    . KNEE ARTHROSCOPY Bilateral 2017  . ORIF FEMUR FRACTURE Right 09/26/2016  . ORIF PERIPROSTHETIC FRACTURE Right 09/27/2016   Procedure: OPEN REDUCTION INTERNAL FIXATION (ORIF) PERIPROSTHETIC FRACTURE;  Surgeon: Rod Can, MD;  Location: [place];  Service: Orthopedics;  Laterality: Right;  Femeral Component Rev, DEPUY AML Stem, Zimmer Cables, LATERAL POSITION WITH MARK II. POSTERIOR APPROACH.   . TOTAL HIP ARTHROPLASTY Right 08/10/2016   Procedure: RIGHT TOTAL HIP ARTHROPLASTY ANTERIOR APPROACH;  Surgeon: Rod Can, MD;  Location: [place];  Service: Orthopedics;  Laterality: Right;  . TOTAL HIP ARTHROPLASTY Right 09/27/2016   Procedure: TOTAL HIP ARTHROPLASTY revision of femoral component;  Surgeon: Rod Can, MD;  Location: [place];  Service: Orthopedics;  Laterality: Right;    Assessment & Plan Clinical Impression: Patient is a 61 y.o. year old male with recent admission to the hospital on 09/27/2016 after a fall and inability to bear weight on the right leg. X-rays and imaging revealed right hip periprosthetic fracture. Underwent ORIF 09/27/2016 per Dr. Lyla Glassing. Hospital course pain management. Partial.  Patient transferred to CIR on 09/30/2016 .    Patient currently requires min with basic self-care skills secondary to muscle weakness and muscle joint tightness and decreased standing balance, decreased balance strategies and difficulty maintaining precautions.  Prior to hospitalization, patient could complete ADLs with independent .  Patient will benefit from skilled intervention to decrease level of assist with basic self-care skills and increase independence with basic self-care skills prior to discharge home with care partner.  Anticipate patient will reach modified independent level for home, anticipate no follow-up OT needs post rehab stay.   OT - End of Session Activity Tolerance: Tolerates 30+ min activity with multiple rests Endurance Deficit: No OT Assessment Rehab Potential (ACUTE ONLY): Good Barriers to Discharge: Decreased caregiver support OT Patient demonstrates impairments in the following area(s): Balance;Pain OT Basic ADL's Functional Problem(s):  Grooming;Bathing;Dressing;Toileting OT Advanced ADL's Functional Problem(s): Simple Meal Preparation;Laundry;Light Housekeeping OT Transfers Functional Problem(s): Tub/Shower;Toilet OT Additional Impairment(s):  None OT Plan OT Intensity: Minimum of 1-2 x/day, 45 to 90 minutes OT Frequency: 5 out of 7 days OT Duration/Estimated Length of Stay: 5- 7 days OT Treatment/Interventions: Balance/vestibular training;Cognitive remediation/compensation;DME/adaptive equipment instruction;Discharge planning;Pain management;Self Care/advanced ADL retraining;Therapeutic Activities;UE/LE Coordination activities;Therapeutic Exercise;Patient/family education;Functional mobility training;Neuromuscular re-education;UE/LE Strength taining/ROM OT Self Feeding Anticipated Outcome(s): independent OT Basic Self-Care Anticipated Outcome(s): modified independent OT Toileting Anticipated Outcome(s): modified independent OT Bathroom Transfers Anticipated Outcome(s): modified independent OT Recommendation Patient destination: Home Follow Up Recommendations: None Equipment Recommended: 3 in 1 bedside comode;Tub/shower bench   Skilled Therapeutic Intervention Began working on selfcare retraining sit to stand at the sink during session.  Pt able to complete transfer from supine to sit EOB with HOB elevated and min assist using the bed rail for support.  Min guard assist for stand pivot transfer from bed to wheelchair following [place] status. He was only able to state 1/3 THR precautions when asked initially and needed mod instructional cueing to maintain greater than 90 degree hip flexion in sitting as he would attempt to reach down his non-operated leg to wash it and remove clothing.  Min guard assist as well for all sit to stand transitions with LB selfcare.  Finished session with pt sitting in wheelchair with call button and phone in reach.    OT Evaluation Precautions/Restrictions  Precautions Precautions: Posterior Hip;Fall Precaution Booklet Issued: Yes (comment) Precaution Comments: precautions provided, reviewed and posted Required Braces or Orthoses: Other Brace/Splint Other Brace/Splint: hip abduction  brace Restrictions Weight Bearing Restrictions: Yes RLE Weight Bearing: Partial weight bearing RLE Partial Weight Bearing Percentage or Pounds: 30%  Vital Signs Therapy Vitals Temp: 99 F (37.2 C) Temp Source: Oral Pulse Rate: 80 Resp: 18 BP: 130/60 Patient Position (if appropriate): Sitting Oxygen Therapy SpO2: 98 % O2 Device: Not Delivered Pain Pain Assessment Pain Assessment: 0-10 Pain Score: 7  Pain Type: Acute pain Pain Location: Leg Pain Orientation: Right Pain Descriptors / Indicators: Discomfort Pain Frequency: Constant Pain Onset: With Activity Patients Stated Pain Goal: 4 Pain Intervention(s): Repositioned Home Living/Prior [place] expects to be discharged to:: Private residence Living Arrangements: Spouse/significant other, Other relatives Available Help at Discharge: Available 24 hours/day (neice at home now assisting with spouse) Type of Home: House Home Access: Ramped entrance [place]: One level Bathroom Shower/Tub: Tub/shower unit, Architectural technologist: Handicapped height Bathroom Accessibility: No Additional Comments: unsure how long niece will be around to assist. Planning to sleep in lift chair. States that his spouse is unable to provide any assistance at home.   Lives With: Spouse IADL History Homemaking Responsibilities: Yes Meal Prep Responsibility: Primary Laundry Responsibility: Primary Cleaning Responsibility: Primary Bill Paying/Finance Responsibility: Primary Shopping Responsibility: Primary Current License: Yes Mode of Transportation: Car Occupation: On disability Leisure and Hobbies: working in the yard, building go [employer] Prior Function Level of Independence: Independent with basic ADLs Driving: Yes Vocation: On disability Leisure: Hobbies-no Comments: enjoys his dogs ADL  See Function Section of chart for details  Vision Baseline Vision/History: Wears glasses Wears Glasses: At all  times Patient Visual Report: No change from baseline Eye Alignment: Within Functional Limits Perception  Perception: Within Functional Limits Praxis Praxis: Intact Cognition Overall Cognitive Status: Within Functional Limits for tasks assessed Arousal/Alertness: Awake/alert Orientation Level: Person;Place;Situation Person: Oriented Place: Oriented Situation: Oriented Year: 2018 Month: May Day of Week: Correct Memory: Impaired (Initially had difficulty stating hip  precautions) Memory Impairment: Decreased recall of new information Immediate Memory Recall: Sock;Blue;Bed Memory Recall: Sock;Blue;Bed Memory Recall Sock: Without Cue Memory Recall Blue: Without Cue Memory Recall Bed: Without Cue Attention: Sustained;Selective Sustained Attention: Appears intact Selective Attention: Appears intact Awareness: Appears intact Problem Solving: Appears intact Safety/Judgment: Impaired Comments: Pt needed cueing for correct safe completion of transfers using the RW and attempted to reach down to his lower leg, breaking his 90 degree hip precaution before therapist cued him not to.   Sensation Sensation Light Touch: Appears Intact Stereognosis: Appears Intact Hot/Cold: Appears Intact Proprioception: Appears Intact Additional Comments: Pt with slight numbness in the lateral aspect of the 5th digit of the left hand secondary to ulnar nerve reposiitoning surgery previously. Coordination Gross Motor Movements are Fluid and Coordinated: Yes Fine Motor Movements are Fluid and Coordinated: Yes Motor  Motor Motor: Within Functional Limits Motor - Skilled Clinical Observations: Decreased Rt LE strength due to reports of pain.  Mobility  Bed Mobility Bed Mobility: Supine to Sit Supine to Sit: 5: Supervision;HOB flat;With rails Transfers Transfers: Sit to Stand;Stand to Sit Sit to Stand: 5: Supervision;From bed;With upper extremity assist;From chair/3-in-1 Stand to Sit: 5: Supervision;With  upper extremity assist;To bed;To chair/3-in-1  Trunk/Postural Assessment  Cervical Assessment Cervical Assessment: Within Functional Limits Thoracic Assessment Thoracic Assessment: Within Functional Limits Lumbar Assessment Lumbar Assessment: Within Functional Limits Postural Control Postural Control: Within Functional Limits  Balance Balance Balance Assessed: Yes Static Sitting Balance Static Sitting - Balance Support: No upper extremity supported Static Sitting - Level of Assistance: 5: Stand by assistance Dynamic Sitting Balance Dynamic Sitting - Balance Support: No upper extremity supported Dynamic Sitting - Level of Assistance: 5: Stand by assistance Static Standing Balance Static Standing - Balance Support: Bilateral upper extremity supported Static Standing - Level of Assistance: 5: Stand by assistance Dynamic Standing Balance Dynamic Standing - Balance Support: Bilateral upper extremity supported Dynamic Standing - Level of Assistance: 5: Stand by assistance Extremity/Trunk Assessment RUE Assessment RUE Assessment: Within Functional Limits LUE Assessment LUE Assessment: Within Functional Limits   See Function Navigator for Current Functional Status.   Refer to Care Plan for Long Term Goals  Recommendations for other services: None    Discharge Criteria: Patient will be discharged from OT if patient refuses treatment 3 consecutive times without medical reason, if treatment goals not met, if there is a change in medical status, if patient makes no progress towards goals or if patient is discharged from hospital.  The above assessment, treatment plan, treatment alternatives and goals were discussed and mutually agreed upon: by patient  Nester Bachus OTR/L 10/01/2016, 4:49 PM

## 2016-10-01 NOTE — Progress Notes (Signed)
Patient information reviewed and entered into eRehab system by Rima Blizzard, RN, CRRN, PPS Coordinator.  Information including medical coding and functional independence measure will be reviewed and updated through discharge.     Per nursing patient was given "Data Collection Information Summary for Patients in Inpatient Rehabilitation Facilities with attached "Privacy Act Statement-Health Care Records" upon admission.  

## 2016-10-01 NOTE — Progress Notes (Signed)
Social Work Assessment and Plan Social Work Assessment and Plan  Patient Details  Name: Russell Steele MRN: [id number] Date of Birth: 10/19/1955  Today's Date: 10/01/2016  Problem List:  Patient Active Problem List   Diagnosis Date Noted  . Chronic bilateral low back pain   . Hyperglycemia   . Hypoalbuminemia due to protein-calorie malnutrition ([facility])   . Benign prostatic hyperplasia   . Constipation due to pain medication   . Periprosthetic fracture around internal prosthetic hip joint 09/30/2016  . Adjustment disorder with mixed anxiety and depressed mood   . Tobacco abuse   . OSA (obstructive sleep apnea)   . History of right hip replacement   . Post-operative pain   . Acute blood loss anemia   . Stage 3 chronic kidney disease   . Periprosthetic fracture around internal prosthetic hip joint, initial encounter 09/27/2016  . Periprosthetic fracture around internal prosthetic right hip joint ([facility]) 09/26/2016  . Avascular necrosis of hip, right ([facility]) 08/10/2016   Past Medical History:  Past Medical History:  Diagnosis Date  . Anxiety   . Arthritis   . Depression   . History of kidney stones   . Sleep apnea    Past Surgical History:  Past Surgical History:  Procedure Laterality Date  . AMPUTATION TOE    . APPENDECTOMY    . BACK SURGERY    . DENTAL SURGERY    . KNEE ARTHROSCOPY Bilateral 2017  . ORIF FEMUR FRACTURE Right 09/26/2016  . ORIF PERIPROSTHETIC FRACTURE Right 09/27/2016   Procedure: OPEN REDUCTION INTERNAL FIXATION (ORIF) PERIPROSTHETIC FRACTURE;  Surgeon: Samson FredericSwinteck, Brian, MD;  Location[facility];  Service: Orthopedics;  Laterality: Right;  Femeral Component Rev, DEPUY AML Stem, Zimmer Cables, LATERAL POSITION WITH MARK II. POSTERIOR APPROACH.  . TOTAL HIP ARTHROPLASTY Right 08/10/2016   Procedure: RIGHT TOTAL HIP ARTHROPLASTY ANTERIOR APPROACH;  Surgeon: Samson FredericBrian Swinteck, MD;  Location: [facility];  Service: Orthopedics;  Laterality: Right;  . TOTAL HIP ARTHROPLASTY Right  09/27/2016   Procedure: TOTAL HIP ARTHROPLASTY revision of femoral component;  Surgeon: Samson FredericSwinteck, Brian, MD;  Location: [facility];  Service: Orthopedics;  Laterality: Right;   Social History:  reports that he has been smoking Cigarettes.  He has been smoking about 0.50 packs per day. He has never used smokeless tobacco. He reports that he drinks about 1.8 oz of alcohol per week . He reports that he does not use drugs.  Family / Support Systems Marital Status: Married Patient Roles: Spouse, Engineer, structuralCaregiver, Parent Spouse/Significant Other: Verlon AuLeslie [phone] Children: Daughter in [place] Other Supports: [name]-niece [phone] Anticipated Caregiver: Self and niece Ability/Limitations of Caregiver: Niece is here to assist with pt's wife while he recovers from his hip surgery Caregiver Availability: Other (Comment) (short time someone there with them) Family Dynamics: Pt reports he is close wiht daughter and extended family. His wife has had a CVA in the past and requires 24 hr supervision due to memory issues. He feels he has to take care of others and is not able to fully take care of himself. he is glad his niece is here to assist with both of their care.  Social History Preferred language: English Religion:  Cultural Background: No issues Education: High School Read: Yes Write: Yes Employment Status: Disabled Fish farm managerLegal Hisotry/Current Legal Issues: No issues Guardian/Conservator: none-according to MD pt is capable of making his own decisions while here.   Abuse/Neglect Physical Abuse: Denies Verbal Abuse: Denies Sexual Abuse: Denies Exploitation of patient/patient's resources: Denies Self-Neglect: Denies  Emotional Status  Pt's affect, behavior adn adjustment status: He wants to become independent again and be better balanced this is the reason he fell and caused his fracture. He is the caregiver of his wife and feels the burden of this. He is not caring for others for about a year and is  not one that this comes naturally. Recent Psychosocial Issues: other health issues-recent hip surgery 4/9 and now this complication Pyschiatric History: history of anxiety takes medicines for this and finds it helpful. He may benefit from seeing neuro-psych while here due to life changes and having to be the caregiver of wife since her stroke.  Substance Abuse History: Tobacco aware needs to quit and recommendation but is till considering  Patient / Family Perceptions, Expectations & Goals Pt/Family understanding of illness & functional limitations: Pt can explain his hip surgeries and his treatment plan. He talks with the MD daily and feels his questions have been answered. He is hopeful he will do well here and make good progress while here. Premorbid pt/family roles/activities: Husband, father, uncle, retiree, veteran, etc Anticipated changes in roles/activities/participation: resume Pt/family expectations/goals: Pt states: " I have to be able to take care of myself, I don't have a choice."    [employer]: Other (Comment) (Goes to the [place] for follow up care) Premorbid Home Care/DME Agencies: Other (Comment) (has DME from wife and recent hip surgery) Transportation available at discharge: Niece Resource referrals recommended: Neuropsychology  Discharge Planning Living Arrangements: Spouse/significant other, Other relatives Support Systems: Spouse/significant other, Children, Other relatives, Friends/neighbors Type of Residence: Private residence Insurance Resources: [employer], Media planner (specify) ([place]) Financial Resources: SSD Financial Screen Referred: No Living Expenses: Lives with family Money Management: Patient Does the patient have any problems obtaining your medications?: No Home Management: Patient until hip surgery Patient/Family Preliminary Plans: Return home with niece for a short time she is here to assist with pt's wife who requires care  since her stroke in 2017. Pt is motivated to do well here and feels he has too, due to home situation. Awaiting therapy evaluations regarding goals  and LOS. Social Work Anticipated Follow Up Needs: HH/OP, Support Group  Clinical Impression Pleasant gentleman who is ready for therapies and to regain his function and be mod/i level by discharge. He has limited supports due to caregiver to wife. May benefit from seeing neuro-psych while here due to numerous life changes in the past year.  Lucy Chris 10/01/2016, 2:02 PM

## 2016-10-02 ENCOUNTER — Inpatient Hospital Stay (HOSPITAL_COMMUNITY): Payer: Non-veteran care | Admitting: Physical Therapy

## 2016-10-02 ENCOUNTER — Inpatient Hospital Stay (HOSPITAL_COMMUNITY): Payer: Non-veteran care | Admitting: Occupational Therapy

## 2016-10-02 DIAGNOSIS — M9701XS Periprosthetic fracture around internal prosthetic right hip joint, sequela: Secondary | ICD-10-CM

## 2016-10-02 DIAGNOSIS — G8929 Other chronic pain: Secondary | ICD-10-CM

## 2016-10-02 DIAGNOSIS — M545 Low back pain: Secondary | ICD-10-CM

## 2016-10-02 NOTE — Progress Notes (Signed)
Patient arrived on unit sometime before 1900. Patient was alert and oriented x4 and talkative.

## 2016-10-02 NOTE — Progress Notes (Signed)
Physical Therapy Session Note  Patient Details  Name: Russell Steele MRN: [id number] Date of Birth: 03/24/1956  Today's Date: 10/02/2016 PT Individual Time: 0800-0900 PT Individual Time Calculation (min): 60 min   Short Term Goals: Week 1:  PT Short Term Goal 1 (Week 1): STG=LTG due to short ELOS  Skilled Therapeutic Interventions/Progress Updates:    Pt in bed finishing breakfast upon arrival, agreeable to PT session. Exercises: quad sets, heel slides (aviailable range), SAQ, isometric hip abd, glute sets - 1X12. Ambulation: 100 ft X1, 75 ft X1, 50 ft X1 - using rw with seated rest break as needed. Working on gait pattern while maintaining WB precautions. Pt with tendency to revert to swing to pattern. Following session, pt up in w/c with all needs in reach.   Therapy Documentation Precautions:  Precautions Precautions: Posterior Hip, Fall Precaution Booklet Issued: Yes (comment) Precaution Comments: precautions provided, reviewed and posted Required Braces or Orthoses: Other Brace/Splint Other Brace/Splint: hip abduction brace Restrictions Weight Bearing Restrictions: Yes RLE Weight Bearing: Partial weight bearing RLE Partial Weight Bearing Percentage or Pounds: 30 Pain:  6/10 Rt hip, monitored during session.   See Function Navigator for Current Functional Status.   Therapy/Group: Individual Therapy  Delton SeeBenjamin Loann Chahal, PT 10/02/2016, 8:04 AM

## 2016-10-02 NOTE — Progress Notes (Signed)
Occupational Therapy Session Note  Patient Details  Name: Russell Steele MRN: [id number] Date of Birth: 06/30/1955  Today's Date: 10/02/2016 OT Individual Time: [id number] OT Individual Time Calculation (min): 68 min    Short Term Goals: Week 1:  OT Short Term Goal 1 (Week 1): STGs equal to LTGs set at modified independent level.  Skilled Therapeutic Interventions/Progress Updates:    Pt completed supine to sit with min assist to start session.  He stood to use the urinal with close supervision with support of the RW.  Once finished he transferred to the wheelchair with supervision and rolled himself down to the therapy gym where he was able to work on transfers on and off of the therapy mat with supervision using the left lifter to assist with movement of the RLE.  Once finished, therapist had him complete 11 mins on the UE ergonometer on level 11 for the first set and level 14 for the second set.  He was able to maintain RPMs at 25 throughout both sets.  Three minute rest break in-between sessions.  Pt ambulated back to his room with supervision adhering to [facility] precautions.  Once in the room nursing gave medications and pt was left sitting on the EOB with call button in reach.  Pt reported he would lay down and get assistance with pulling up bed rail if needed.  Let nursing know as well that he wanted to sit on the EOB.      Therapy Documentation Precautions:  Precautions Precautions: Posterior Hip, Fall Precaution Booklet Issued: Yes (comment) Precaution Comments: precautions provided, reviewed and posted Required Braces or Orthoses: Other Brace/Splint Other Brace/Splint: hip abduction brace Restrictions Weight Bearing Restrictions: Yes RLE Weight Bearing: Partial weight bearing RLE Partial Weight Bearing Percentage or Pounds: 30  Pain: Pain Assessment Pain Assessment: 0-10 Pain Score: 7  Pain Type: Acute pain Pain Location: Leg Pain Orientation: Right Pain Descriptors /  Indicators: Discomfort Pain Onset: With Activity Pain Intervention(s): Repositioned;Medication (See eMAR) ADL: See Function Navigator for Current Functional Status.   Therapy/Group: Individual Therapy  Melondy Blanchard OTR/L 10/02/2016, 3:57 PM

## 2016-10-02 NOTE — Addendum Note (Signed)
Addendum  created 10/02/16 1329 by Leilany Digeronimo, MD   Sign clinical note    

## 2016-10-02 NOTE — Progress Notes (Signed)
[place] PHYSICAL MEDICINE & REHABILITATION     PROGRESS NOTE  Subjective/Complaints:   No pain c/os  ROS: Denies CP, SOB, N/V/D.  Objective: Vital Signs: Blood pressure (!) 144/66, pulse 85, temperature 99.1 F (37.3 C), temperature source Oral, resp. rate 16, height 6\' 3"  (1.905 m), weight 118.5 kg (261 lb 3.2 oz), SpO2 99 %. No results found.  Recent Labs  09/30/16 0603 10/01/16 0557  WBC 8.0 7.2  HGB 8.5* 8.2*  HCT 26.6* 25.9*  PLT 225 246    Recent Labs  10/01/16 0557  NA 137  K 4.3  CL 98*  GLUCOSE 101*  BUN 9  CREATININE 0.93  CALCIUM 8.7*   CBG (last 3)  No results for input(s): GLUCAP in the last 72 hours.  Wt Readings from Last 3 Encounters:  09/30/16 118.5 kg (261 lb 3.2 oz)  09/29/16 123.5 kg (272 lb 4.8 oz)  08/10/16 119.4 kg (263 lb 4 oz)    Physical Exam:  BP (!) 144/66 (BP Location: Left Arm) Comment: rn notified  Pulse 85   Temp 99.1 F (37.3 C) (Oral)   Resp 16   Ht 6\' 3"  (1.905 m)   Wt 118.5 kg (261 lb 3.2 oz)   SpO2 99%   BMI 32.65 kg/m  Constitutional: He appears well-developed and well-nourished.  HENT: Normocephalic and atraumatic.  Eyes: EOMI. No discharge.  Cardiovascular: Normal rate, regular rhythm. No JVD Respiratory: Effort normal and breath sounds normal GI: Soft. Bowel sounds are normal.  Musculoskeletal: He exhibits edema and tenderness.  Neurological: He is alert and oriented.  Motor: B/l UE 5/5 proximal to distal LLE: HF 4/5, distally 5/5 RLE: HF 2/5 (pain inhibition, unchanged), ADF/PF 5/5  Skin: Hip incision dressed clean and dry with abduction brace in place  Psychiatric: Easily agitated.  Assessment/Plan: 1. Functional deficits secondary to right hip periprosthetic fracture status post ORIF which require 3+ hours per day of interdisciplinary therapy in a comprehensive inpatient rehab setting. Physiatrist is providing close team supervision and 24 hour management of active medical problems listed  below. Physiatrist and rehab team continue to assess barriers to discharge/monitor patient progress toward functional and medical goals.  Function:  Bathing Bathing position   Position: Wheelchair/chair at sink  Bathing parts Body parts bathed by patient: Right arm, Left arm, Chest, Abdomen, Front perineal area, Buttocks, Right upper leg, Left upper leg Body parts bathed by helper: Right lower leg, Left lower leg, Back  Bathing assist        Upper Body Dressing/Undressing Upper body dressing   What is the patient wearing?: Pull over shirt/dress     Pull over shirt/dress - Perfomed by patient: Thread/unthread right sleeve, Thread/unthread left sleeve, Put head through opening, Pull shirt over trunk          Upper body assist        Lower Body Dressing/Undressing Lower body dressing   What is the patient wearing?: Non-skid slipper socks, Underwear Underwear - Performed by patient: Pull underwear up/down Underwear - Performed by helper: Thread/unthread right underwear leg, Thread/unthread left underwear leg     Non-skid slipper socks- Performed by patient: Don/doff right sock, Don/doff left sock Non-skid slipper socks- Performed by helper: Don/doff right sock, Don/doff left sock                  Lower body assist Assist for lower body dressing: Assistive device Assistive Device Comment: sock aid and Chief of Staffreacher    Toileting Toileting   Toileting steps  completed by patient: Adjust clothing prior to toileting, Performs perineal hygiene, Adjust clothing after toileting   Toileting Assistive Devices: Grab bar or rail, Toilet aid  Toileting assist Assist level: Supervision or verbal cues   Transfers Chair/bed transfer   Chair/bed transfer method: Stand pivot Chair/bed transfer assist level: Supervision or verbal cues Chair/bed transfer assistive device: Walker, Bedrails     Locomotion Ambulation     Max distance: 18 ft Assist level: Touching or steadying assistance  (Pt > 75%)   Wheelchair   Type: Manual Max wheelchair distance: 100 ft Assist Level: Supervision or verbal cues  Cognition Comprehension Comprehension assist level: Follows complex conversation/direction with extra time/assistive device  Expression Expression assist level: Expresses complex ideas: With no assist  Social Interaction Social Interaction assist level: Interacts appropriately with others with medication or extra time (anti-anxiety, antidepressant).  Problem Solving Problem solving assist level: Solves complex problems: With extra time  Memory Memory assist level: Recognizes or recalls 90% of the time/requires cueing < 10% of the time    Medical Problem List and Plan: 1.  Decreased functional mobility secondary to right hip periprosthetic fracture status post ORIF. 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  DVT Prophylaxis/Anticoagulation: Eliquis.   Vascular study Neg 3. Pain Management/chronic back pain: Neurontin 600 mg 3 times a day,  Started Roxi 15 q3,  4. Mood: Prozac 20 mg daily, BuSpar 15 mg 4 times a day 5. Neuropsych: This patient is capable of making decisions on his own behalf. 6. Skin/Wound Care: Routine skin checks 7. Fluids/Electrolytes/Nutrition: Routine I&Os   BMP within acceptable range on 5/31 8. Acute blood loss anemia.   Hb 8.2 on 5/31  Cont to monitor 9. Hyperlipidemia. Zocor 10. BPH. Flomax  Monitor for retention 11. History of tobacco abuse. Counseling 12. Constipation. Laxative assistance 13. Hyperglycemia  Likely stress induced  Monitor with increased activity 14. Hypoalbuminemia  Supplement initiated 5/31  LOS (Days) 2 A FACE TO FACE EVALUATION WAS PERFORMED  Elizabth Palka E 10/02/2016 8:22 AM

## 2016-10-02 NOTE — Progress Notes (Signed)
Occupational Therapy Session Note  Patient Details  Name: Russell Steele MRN: [id number] Date of Birth: 09/04/1955  Today's Date: 10/02/2016 OT Individual Time: 1003-1101 OT Individual Time Calculation (min): 58 min    Short Term Goals: Week 1:  OT Short Term Goal 1 (Week 1): STGs equal to LTGs set at modified independent level.  Skilled Therapeutic Interventions/Progress Updates:    Pt completed toileting, bathing, and dressing during session.  He was able to complete toilet transfer with supervision to the elevated toilet.  He was able to then transfer to the [facility] CBSC in the shower.  He was able to shower with supervision and use of the LH sponge.  Educated on use of the reacher and large sock aide for donning LB clothing.  Pt with one episode of flexing past 90 degrees, requiring therapist cueing to keep bending too far.  Once LOB noted when attempting to step back to the wheelchair with the RW, requiring min assist to correct.  Session completed with transfer back to the bed with supervision.    Therapy Documentation Precautions:  Precautions Precautions: Posterior Hip, Fall Precaution Booklet Issued: Yes (comment) Precaution Comments: precautions provided, reviewed and posted Required Braces or Orthoses: Other Brace/Splint Other Brace/Splint: hip abduction brace Restrictions Weight Bearing Restrictions: Yes RLE Weight Bearing: Partial weight bearing RLE Partial Weight Bearing Percentage or Pounds: 30  Pain: Pain Assessment Pain Assessment: 0-10 Pain Score: 7  Pain Type: Acute pain Pain Location: Leg Pain Orientation: Right Pain Descriptors / Indicators: Aching;Discomfort Pain Frequency: Constant Pain Onset: On-going Patients Stated Pain Goal: 4 Pain Intervention(s): Repositioned ADL: See Function Navigator for Current Functional Status.   Therapy/Group: Individual Therapy  Nailani Full OTR/L 10/02/2016, 12:28 PM

## 2016-10-03 DIAGNOSIS — M9702XS Periprosthetic fracture around internal prosthetic left hip joint, sequela: Secondary | ICD-10-CM

## 2016-10-03 NOTE — Plan of Care (Signed)
Problem: RH PAIN MANAGEMENT Goal: RH STG PAIN MANAGED AT OR BELOW PT'S PAIN GOAL Pain less than or equal to 2.   

## 2016-10-03 NOTE — Progress Notes (Signed)
[place] PHYSICAL MEDICINE & REHABILITATION     PROGRESS NOTE  Subjective/Complaints:   Feeling well. Just woke up. Pain controlled  ROS: pt denies nausea, vomiting, diarrhea, cough, shortness of breath or chest pain   Objective: Vital Signs: Blood pressure (!) 146/58, pulse 85, temperature 99.2 F (37.3 C), temperature source Oral, resp. rate 17, height 6\' 3"  (1.905 m), weight 118.5 kg (261 lb 3.2 oz), SpO2 94 %. No results found.  Recent Labs  10/01/16 0557  WBC 7.2  HGB 8.2*  HCT 25.9*  PLT 246    Recent Labs  10/01/16 0557  NA 137  K 4.3  CL 98*  GLUCOSE 101*  BUN 9  CREATININE 0.93  CALCIUM 8.7*   CBG (last 3)  No results for input(s): GLUCAP in the last 72 hours.  Wt Readings from Last 3 Encounters:  09/30/16 118.5 kg (261 lb 3.2 oz)  09/29/16 123.5 kg (272 lb 4.8 oz)  08/10/16 119.4 kg (263 lb 4 oz)    Physical Exam:  BP (!) 146/58 (BP Location: Right Arm) Comment: RN notified  Pulse 85   Temp 99.2 F (37.3 C) (Oral)   Resp 17   Ht 6\' 3"  (1.905 m)   Wt 118.5 kg (261 lb 3.2 oz)   SpO2 94%   BMI 32.65 kg/m  Constitutional: He appears well-developed and well-nourished.  HENT: Normocephalic and atraumatic.  Eyes: EOMI. No discharge.  Cardiovascular: RRR Respiratory: Effort normal and breath sounds normal GI: Soft. Bowel sounds are normal.  Musculoskeletal: He exhibits tr LE edema    Neurological: He is alert and oriented.  Motor: B/l UE 5/5 proximal to distal LLE: HF 4/5, distally 5/5 RLE: HF 2/5 (pain inhibition, unchanged), ADF/PF 5/5  Skin: Hip incision dressed clean and dry with abduction brace in place  Psychiatric: Easily agitated.  Assessment/Plan: 1. Functional deficits secondary to right hip periprosthetic fracture status post ORIF which require 3+ hours per day of interdisciplinary therapy in a comprehensive inpatient rehab setting. Physiatrist is providing close team supervision and 24 hour management of active medical problems  listed below. Physiatrist and rehab team continue to assess barriers to discharge/monitor patient progress toward functional and medical goals.  Function:  Bathing Bathing position   Position: Wheelchair/chair at sink  Bathing parts Body parts bathed by patient: Right arm, Left arm, Chest, Abdomen, Front perineal area, Buttocks, Right upper leg, Left upper leg, Right lower leg, Left lower leg Body parts bathed by helper: Right lower leg, Left lower leg, Back  Bathing assist Assist Level: Supervision or verbal cues      Upper Body Dressing/Undressing Upper body dressing   What is the patient wearing?: Pull over shirt/dress     Pull over shirt/dress - Perfomed by patient: Thread/unthread right sleeve, Thread/unthread left sleeve, Put head through opening, Pull shirt over trunk          Upper body assist        Lower Body Dressing/Undressing Lower body dressing   What is the patient wearing?: Non-skid slipper socks, Ted Hose, Pants Underwear - Performed by patient: Pull underwear up/down Underwear - Performed by helper: Thread/unthread right underwear leg, Thread/unthread left underwear leg Pants- Performed by patient: Thread/unthread right pants leg, Thread/unthread left pants leg, Pull pants up/down   Non-skid slipper socks- Performed by patient: Don/doff right sock, Don/doff left sock Non-skid slipper socks- Performed by helper: Don/doff right sock, Don/doff left sock  TED Hose - Performed by helper: Don/doff right TED hose, Don/doff left TED hose  Lower body assist Assist for lower body dressing: Assistive device Assistive Device Comment: sock aid and reacher    Toileting Toileting   Toileting steps completed by patient: Adjust clothing prior to toileting, Performs perineal hygiene, Adjust clothing after toileting   Toileting Assistive Devices: Grab bar or rail, Toilet aid  Toileting assist Assist level: Supervision or verbal cues    Transfers Chair/bed transfer   Chair/bed transfer method: Ambulatory Chair/bed transfer assist level: Supervision or verbal cues Chair/bed transfer assistive device: Environmental consultant, Bedrails     Locomotion Ambulation     Max distance: 100 ft Assist level: Supervision or verbal cues   Wheelchair   Type: Manual Max wheelchair distance: 100 ft Assist Level: Supervision or verbal cues  Cognition Comprehension Comprehension assist level: Follows complex conversation/direction with extra time/assistive device  Expression Expression assist level: Expresses complex ideas: With no assist  Social Interaction Social Interaction assist level: Interacts appropriately 90% of the time - Needs monitoring or encouragement for participation or interaction.  Problem Solving Problem solving assist level: Solves complex 90% of the time/cues < 10% of the time  Memory Memory assist level: Recognizes or recalls 90% of the time/requires cueing < 10% of the time    Medical Problem List and Plan: 1.  Decreased functional mobility secondary to right hip periprosthetic fracture status post ORIF. 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  DVT Prophylaxis/Anticoagulation: Eliquis.   Vascular study Neg 3. Pain Management/chronic back pain: Neurontin 600 mg 3 times a day,  Started Roxi 15 q3,  4. Mood: Prozac 20 mg daily, BuSpar 15 mg 4 times a day 5. Neuropsych: This patient is capable of making decisions on his own behalf. 6. Skin/Wound Care: Routine skin checks 7. Fluids/Electrolytes/Nutrition: Routine I&Os   BMP within acceptable range on 5/31 8. Acute blood loss anemia.   Hb 8.2 on 5/31  Cont to monitor--follow up next week 9. Hyperlipidemia. Zocor 10. BPH. Flomax  Monitor for retention 11. History of tobacco abuse. Counseling 12. Constipation. Laxative assistance 13. Hyperglycemia  Likely stress induced  Monitor  with increased activity 14. Hypoalbuminemia  Supplement initiated 5/31  LOS (Days) 3 A FACE TO FACE EVALUATION WAS PERFORMED  SWARTZ,ZACHARY T 10/03/2016 8:30 AM

## 2016-10-04 ENCOUNTER — Inpatient Hospital Stay (HOSPITAL_COMMUNITY): Payer: Non-veteran care | Admitting: Physical Therapy

## 2016-10-04 ENCOUNTER — Inpatient Hospital Stay (HOSPITAL_COMMUNITY): Payer: Non-veteran care

## 2016-10-04 NOTE — Progress Notes (Signed)
Occupational Therapy Session Note  Patient Details  Name: Russell Steele MRN: [id number] Date of Birth: 01/17/1956  Today's Date: 10/04/2016 OT Individual Time: 1300-1400 OT Individual Time Calculation (min): 60 min    Short Term Goals: Week 1:  OT Short Term Goal 1 (Week 1): STGs equal to LTGs set at modified independent level.  Skilled Therapeutic Interventions/Progress Updates:    1:1. Reporting pain, however does not rate. Pt does not want to alert RN. Pt agreeable to bathe and dress this session at shower level. Pt finishes eating lunch at EOB and ambulates throughout room to gather clothing and shower items with supervision and VC to use golfers kick to maintain flexion precaution while reaching into low drawer. Pt stands to void urine with supervision and transfers onto BSC in shower to bathe in sitting with supervision. Pt dresses UB with set up and LB with supervision with VC for AE technique. Pt completes grooming in standing at sink. Exited session with pt seated EOB with call light in reach and all needs met.   Therapy Documentation Precautions:  Precautions Precautions: Posterior Hip, Fall Precaution Booklet Issued: Yes (comment) Precaution Comments: precautions provided, reviewed and posted Required Braces or Orthoses: Other Brace/Splint Other Brace/Splint: hip abduction brace Restrictions Weight Bearing Restrictions: Yes RLE Weight Bearing: Partial weight bearing RLE Partial Weight Bearing Percentage or Pounds: 30  See Function Navigator for Current Functional Status.   Therapy/Group: Individual Therapy  Stephanie M Schlosser 10/04/2016, 1:22 PM  

## 2016-10-04 NOTE — Progress Notes (Signed)
Occupational Therapy Session Note  Patient Details  Name: Russell Steele MRN: [id number] Date of Birth: 06/24/1955  Today's Date: 10/04/2016 OT Individual Time: 7829-56210815-0915 OT Individual Time Calculation (min): 60 min    Short Term Goals: Week 1:  OT Short Term Goal 1 (Week 1): STGs equal to LTGs set at modified independent level.  Skilled Therapeutic Interventions/Progress Updates:    1:1. Pt reporting pain however not rating it. RN just administered medication. Pt requiring increased time to get up to EOB using leg lifter d/t stiffness with supervision overall. While seated EOB pt practices donning/doffing socks and shoes 2x with reacher, sock aide and shoe funnel with VC for technique. Ot provides elastic laces and pt changes out shoe laces. Pt with 1 incident of bending past 90 in session with VC to maintain precautions. Pt ambulates to/from regular bed in ADL apartment  With supervisionand transfers onto it using LE lifter to simulate home environment for d/c with supervision. Exited session with pt seated at EOB per preference and call light in reach.  Therapy Documentation Precautions:  Precautions Precautions: Posterior Hip, Fall Precaution Booklet Issued: Yes (comment) Precaution Comments: precautions provided, reviewed and posted Required Braces or Orthoses: Other Brace/Splint Other Brace/Splint: hip abduction brace Restrictions Weight Bearing Restrictions: Yes RLE Weight Bearing: Partial weight bearing RLE Partial Weight Bearing Percentage or Pounds: 30  See Function Navigator for Current Functional Status.   Therapy/Group: Individual Therapy  Shon HaleStephanie M Mckinley Adelstein 10/04/2016, 10:43 AM

## 2016-10-04 NOTE — Progress Notes (Signed)
[place] PHYSICAL MEDICINE & REHABILITATION     PROGRESS NOTE  Subjective/Complaints:   No new complaints. Pain controlled.   ROS: pt denies nausea, vomiting, diarrhea, cough, shortness of breath or chest pain   Objective: Vital Signs: Blood pressure 115/76, pulse 79, temperature 98.7 F (37.1 C), temperature source Oral, resp. rate 18, height 6\' 3"  (1.905 m), weight 118.5 kg (261 lb 3.2 oz), SpO2 98 %. No results found. No results for input(s): WBC, HGB, HCT, PLT in the last 72 hours. No results for input(s): NA, K, CL, GLUCOSE, BUN, CREATININE, CALCIUM in the last 72 hours.  Invalid input(s): CO CBG (last 3)  No results for input(s): GLUCAP in the last 72 hours.  Wt Readings from Last 3 Encounters:  09/30/16 118.5 kg (261 lb 3.2 oz)  09/29/16 123.5 kg (272 lb 4.8 oz)  08/10/16 119.4 kg (263 lb 4 oz)    Physical Exam:  BP 115/76 (BP Location: Right Arm)   Pulse 79   Temp 98.7 F (37.1 C) (Oral)   Resp 18   Ht 6\' 3"  (1.905 m)   Wt 118.5 kg (261 lb 3.2 oz)   SpO2 98%   BMI 32.65 kg/m  Constitutional: He appears well-developed and well-nourished.  HENT: Normocephalic and atraumatic.  Eyes: EOMI. No discharge.  Cardiovascular: RRR Respiratory: Effort normal and breath sounds normal GI: Soft. Bowel sounds are normal.  Musculoskeletal: He exhibits tr LE edema.     Neurological: He is alert and oriented.  Motor: B/l UE 5/5 proximal to distal LLE: HF 4/5, distally 5/5 RLE: HF 2/5 (pain inhibition, unchanged), ADF/PF 5/5  Skin: Hip incision clean and dry with abduction brace in place  Psychiatric: pleasant  Assessment/Plan: 1. Functional deficits secondary to right hip periprosthetic fracture status post ORIF which require 3+ hours per day of interdisciplinary therapy in a comprehensive inpatient rehab setting. Physiatrist is providing close team supervision and 24 hour management of active medical problems listed below. Physiatrist and rehab team continue to assess  barriers to discharge/monitor patient progress toward functional and medical goals.  Function:  Bathing Bathing position   Position: Wheelchair/chair at sink  Bathing parts Body parts bathed by patient: Right arm, Left arm, Chest, Abdomen, Front perineal area, Buttocks, Right upper leg, Left upper leg, Right lower leg, Left lower leg Body parts bathed by helper: Right lower leg, Left lower leg, Back  Bathing assist Assist Level: Supervision or verbal cues      Upper Body Dressing/Undressing Upper body dressing   What is the patient wearing?: Pull over shirt/dress     Pull over shirt/dress - Perfomed by patient: Thread/unthread right sleeve, Thread/unthread left sleeve, Put head through opening, Pull shirt over trunk          Upper body assist        Lower Body Dressing/Undressing Lower body dressing   What is the patient wearing?: Non-skid slipper socks, Ted Hose, Pants Underwear - Performed by patient: Pull underwear up/down Underwear - Performed by helper: Thread/unthread right underwear leg, Thread/unthread left underwear leg Pants- Performed by patient: Thread/unthread right pants leg, Thread/unthread left pants leg, Pull pants up/down   Non-skid slipper socks- Performed by patient: Don/doff right sock, Don/doff left sock Non-skid slipper socks- Performed by helper: Don/doff right sock, Don/doff left sock               TED Hose - Performed by helper: Don/doff right TED hose, Don/doff left TED hose  Lower body assist Assist for lower body  dressing: Assistive device Assistive Device Comment: sock aid and Chief of Staff   Toileting steps completed by patient: Adjust clothing prior to toileting, Performs perineal hygiene, Adjust clothing after toileting   Toileting Assistive Devices: Grab bar or rail, Toilet aid  Toileting assist Assist level: Supervision or verbal cues   Transfers Chair/bed transfer   Chair/bed transfer method:  Ambulatory Chair/bed transfer assist level: Supervision or verbal cues Chair/bed transfer assistive device: Environmental consultant, Bedrails     Locomotion Ambulation     Max distance: 100 ft Assist level: Supervision or verbal cues   Wheelchair   Type: Manual Max wheelchair distance: 100 ft Assist Level: Supervision or verbal cues  Cognition Comprehension Comprehension assist level: Follows complex conversation/direction with extra time/assistive device  Expression Expression assist level: Expresses complex ideas: With no assist  Social Interaction Social Interaction assist level: Interacts appropriately 90% of the time - Needs monitoring or encouragement for participation or interaction.  Problem Solving Problem solving assist level: Solves complex 90% of the time/cues < 10% of the time  Memory Memory assist level: Recognizes or recalls 90% of the time/requires cueing < 10% of the time    Medical Problem List and Plan: 1.  Decreased functional mobility secondary to right hip periprosthetic fracture status post ORIF. 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  DVT Prophylaxis/Anticoagulation: Eliquis.   Vascular study Neg 3. Pain Management/chronic back pain: Neurontin 600 mg 3 times a day,  Started Roxi 15 q3,  4. Mood: Prozac 20 mg daily, BuSpar 15 mg 4 times a day 5. Neuropsych: This patient is capable of making decisions on his own behalf. 6. Skin/Wound Care: Routine skin checks 7. Fluids/Electrolytes/Nutrition: Routine I&Os   BMP within acceptable range  8. Acute blood loss anemia.   Hb 8.2 on 5/31  Cont to monitor--follow up next week 9. Hyperlipidemia. Zocor 10. BPH. Flomax  Monitor for retention 11. History of tobacco abuse. Counseling 12. Constipation. Laxative assistance 13. Hyperglycemia  Likely stress induced  Monitor with increased activity 14. Hypoalbuminemia  Supplement initiated  5/31  LOS (Days) 4 A FACE TO FACE EVALUATION WAS PERFORMED  SWARTZ,ZACHARY T 10/04/2016 8:27 AM

## 2016-10-04 NOTE — Progress Notes (Signed)
Physical Therapy Session Note  Patient Details  Name: Russell Steele MRN: [id number] Date of Birth: 08/01/1955  Today's Date: 10/04/2016 PT Individual Time: 1050-1200 PT Individual Time Calculation (min): 70 min   Short Term Goals: Week 1:  PT Short Term Goal 1 (Week 1): STG=LTG due to short ELOS  Skilled Therapeutic Interventions/Progress Updates: Pt received asleep in bed, easily awoken and agreeable to treatment. C/o pain as below, pre-medicated. Gait to gym with RW and S, maintaining WB precautions RLE without cueing. Pt able to verablize hip precautions without assist. Nustep performed x10 min with BUE, LLE for strengthening and aerobic endurance; level 5 with average 45 steps/min. Stairs 2x8 on 6" stairs with B handrails and step-to pattern, 2x8 on 3" steps with reciprocal pattern with pt cued to maintain weight bearing through BUEs to abide by WB precautions. Pt reports stairs at home to enter home have R rail on ascent only, and pt reports reaching for the door handle with LUE. Also reports his wife is much smaller than him and not able to physically assist as he is afraid he will hurt her if he were to fall. Will discuss with primary PT and may need to upgrade stair goal to S and suspect that will be achievable. Couch transfer performed with RW and S; increased pain with sitting d/t low couch height and poor eccentric control to sit. Gait to return to room with RW and S x200'. Gait within room to obtain robe, other personal items with RW and S. Remained seated on EOB at end of session, all needs in reach.      Therapy Documentation Precautions:  Precautions Precautions: Posterior Hip, Fall Precaution Booklet Issued: Yes (comment) Precaution Comments: precautions provided, reviewed and posted Required Braces or Orthoses: Other Brace/Splint Other Brace/Splint: hip abduction brace Restrictions Weight Bearing Restrictions: Yes RLE Weight Bearing: Partial weight bearing RLE Partial Weight  Bearing Percentage or Pounds: 30 Pain: Pain Assessment Pain Assessment: 0-10 Pain Score: 5  Pain Location: Leg Pain Orientation: Right Pain Descriptors / Indicators: Aching Pain Frequency: Constant Pain Onset: On-going Patients Stated Pain Goal: 4 Pain Intervention(s): Medication (See eMAR)   See Function Navigator for Current Functional Status.   Therapy/Group: Individual Therapy  Vista Lawmanlizabeth J Tygielski 10/04/2016, 7:29 AM

## 2016-10-05 ENCOUNTER — Inpatient Hospital Stay (HOSPITAL_COMMUNITY): Payer: Non-veteran care | Admitting: Physical Therapy

## 2016-10-05 ENCOUNTER — Inpatient Hospital Stay (HOSPITAL_COMMUNITY): Payer: Non-veteran care | Admitting: Occupational Therapy

## 2016-10-05 DIAGNOSIS — K5903 Drug induced constipation: Secondary | ICD-10-CM

## 2016-10-05 DIAGNOSIS — R7309 Other abnormal glucose: Secondary | ICD-10-CM

## 2016-10-05 DIAGNOSIS — I10 Essential (primary) hypertension: Secondary | ICD-10-CM

## 2016-10-05 DIAGNOSIS — T889XXS Complication of surgical and medical care, unspecified, sequela: Secondary | ICD-10-CM

## 2016-10-05 DIAGNOSIS — K5909 Other constipation: Secondary | ICD-10-CM

## 2016-10-05 DIAGNOSIS — G8918 Other acute postprocedural pain: Secondary | ICD-10-CM

## 2016-10-05 MED ORDER — OXYCODONE HCL 5 MG PO TABS
15.0000 mg | ORAL_TABLET | ORAL | Status: DC | PRN
  Administered 2016-10-05 – 2016-10-06 (×7): 15 mg via ORAL
  Filled 2016-10-05 (×7): qty 3

## 2016-10-05 NOTE — Progress Notes (Signed)
Social Work Patient ID: Russell Steele, male   DOB: 11/30/1955, 61 y.o.   MRN: [id number]  Team feels pt will be ready to go home tomorrow. MD agreeable. Will work with discharge needs for tomorrow. Made aware equipment is not covered due to medicare part A and [place] get from [facility] to [facility] by them.

## 2016-10-05 NOTE — Progress Notes (Signed)
PHYSICAL MEDICINE & REHABILITATION     PROGRESS NOTE  Subjective/Complaints:  Pt seen laying in bed this AM.  He slept well overnight.  He wants to go home.   ROS: Denies CP, SOB, N/V/D.  Objective: Vital Signs: Blood pressure (!) 123/57, pulse 74, temperature 98.1 F (36.7 C), temperature source Oral, resp. rate 18, height 6\' 3"  (1.905 m), weight 118.5 kg (261 lb 3.2 oz), SpO2 95 %. No results found. No results for input(s): WBC, HGB, HCT, PLT in the last 72 hours. No results for input(s): NA, K, CL, GLUCOSE, BUN, CREATININE, CALCIUM in the last 72 hours.  Invalid input(s): CO CBG (last 3)  No results for input(s): GLUCAP in the last 72 hours.  Wt Readings from Last 3 Encounters:  09/30/16 118.5 kg (261 lb 3.2 oz)  09/29/16 123.5 kg (272 lb 4.8 oz)  08/10/16 119.4 kg (263 lb 4 oz)    Physical Exam:  BP (!) 123/57 (BP Location: Left Arm)   Pulse 74   Temp 98.1 F (36.7 C) (Oral)   Resp 18   Ht 6\' 3"  (1.905 m)   Wt 118.5 kg (261 lb 3.2 oz)   SpO2 95%   BMI 32.65 kg/m  Constitutional: He appears well-developed and well-nourished.  HENT: Normocephalic and atraumatic.  Eyes: EOMI. No discharge.  Cardiovascular: RRR. No JVD. Respiratory: Effort normal and breath sounds normal GI: Soft. Bowel sounds are normal.  Musculoskeletal: He exhibits edema and mild tenderness. Neurological: He is alert and oriented.  Motor: B/l UE 5/5 proximal to distal LLE: HF, KE 4+/5, ADF/PF 5/5 RLE: HF 4/5 (pain inhibition), ADF/PF 5/5  Skin: Hip incision clean and dry with abduction brace in place.  Psychiatric: pleasant. NAD.  Assessment/Plan: 1. Functional deficits secondary to right hip periprosthetic fracture status post ORIF which require 3+ hours per day of interdisciplinary therapy in a comprehensive inpatient rehab setting. Physiatrist is providing close team supervision and 24 hour management of active medical problems listed below. Physiatrist and rehab team continue to  assess barriers to discharge/monitor patient progress toward functional and medical goals.  Function:  Bathing Bathing position   Position: Wheelchair/chair at sink  Bathing parts Body parts bathed by patient: Right arm, Left arm, Chest, Abdomen, Front perineal area, Buttocks, Right upper leg, Left upper leg, Right lower leg, Left lower leg Body parts bathed by helper: Right lower leg, Left lower leg, Back  Bathing assist Assist Level: Supervision or verbal cues      Upper Body Dressing/Undressing Upper body dressing   What is the patient wearing?: Pull over shirt/dress     Pull over shirt/dress - Perfomed by patient: Thread/unthread right sleeve, Thread/unthread left sleeve, Put head through opening, Pull shirt over trunk          Upper body assist Assist Level: Set up      Lower Body Dressing/Undressing Lower body dressing   What is the patient wearing?: Underwear, Pants, Non-skid slipper socks, Ted Hose Underwear - Performed by patient: Thread/unthread right underwear leg, Thread/unthread left underwear leg, Pull underwear up/down Underwear - Performed by helper: Thread/unthread right underwear leg, Thread/unthread left underwear leg Pants- Performed by patient: Thread/unthread right pants leg, Thread/unthread left pants leg, Pull pants up/down   Non-skid slipper socks- Performed by patient: Don/doff right sock, Don/doff left sock Non-skid slipper socks- Performed by helper: Don/doff right sock, Don/doff left sock     Shoes - Performed by patient: Don/doff right shoe, Don/doff left shoe (elastic laces)  TED Hose - Performed by helper: Don/doff right TED hose, Don/doff left TED hose  Lower body assist Assist for lower body dressing: Assistive device, Supervision or verbal cues Assistive Device Comment: sock aid and reacher    Toileting Toileting   Toileting steps completed by patient: Adjust clothing prior to toileting, Performs perineal hygiene, Adjust  clothing after toileting   Toileting Assistive Devices: Grab bar or rail  Toileting assist Assist level: Supervision or verbal cues, More than reasonable time   Transfers Chair/bed transfer   Chair/bed transfer method: Ambulatory Chair/bed transfer assist level: No Help, no cues, assistive device, takes more than a reasonable amount of time Chair/bed transfer assistive device: Walker, Designer, fashion/clothingArmrests     Locomotion Ambulation     Max distance: 200 Assist level: Supervision or verbal cues   Wheelchair   Type: Manual Max wheelchair distance: 100 ft Assist Level: Supervision or verbal cues  Cognition Comprehension Comprehension assist level: Follows complex conversation/direction with extra time/assistive device  Expression Expression assist level: Expresses complex ideas: With no assist  Social Interaction Social Interaction assist level: Interacts appropriately 90% of the time - Needs monitoring or encouragement for participation or interaction.  Problem Solving Problem solving assist level: Solves complex 90% of the time/cues < 10% of the time  Memory Memory assist level: Recognizes or recalls 90% of the time/requires cueing < 10% of the time    Medical Problem List and Plan: 1.  Decreased functional mobility secondary to right hip periprosthetic fracture status post ORIF. Partial weightbearing 30 pounds with recent right total hip arthroplasty 08/10/2016 with posterior hip precautions  Cont CIR, plan for d/c tomorrow  Notes reviewed   2.  DVT Prophylaxis/Anticoagulation: Eliquis.   Vascular study Neg 3. Pain Management/chronic back pain: Neurontin 600 mg 3 times a day,  Roxi changed to 15 q4 PRN  4. Mood: Prozac 20 mg daily, BuSpar 15 mg 4 times a day 5. Neuropsych: This patient is capable of making decisions on his own behalf. 6. Skin/Wound Care: Routine skin checks 7. Fluids/Electrolytes/Nutrition: Routine I&Os   BMP within acceptable range  8. Acute blood loss anemia.   Hb 8.2  on 5/31  Cont to monitor 9. Hyperlipidemia. Zocor 10. BPH. Flomax  Monitor for retention 11. History of tobacco abuse. Counseling 12. Constipation. Laxative assistance 13. Hyperglycemia  Likely stress induced  Monitor with increased activity 14. Hypoalbuminemia  Supplement initiated 5/31  LOS (Days) 5 A FACE TO FACE EVALUATION WAS PERFORMED  Ankit Karis Jubanil Patel 10/05/2016 9:27 AM

## 2016-10-05 NOTE — Progress Notes (Signed)
Occupational Therapy Session Note  Patient Details  Name: Russell Steele MRN: [id number] Date of Birth: 09/28/1955  Today's Date: 10/05/2016 OT Individual Time: [id number] OT Individual Time Calculation (min): 59 min    Short Term Goals: Week 1:  OT Short Term Goal 1 (Week 1): STGs equal to LTGs set at modified independent level.  Skilled Therapeutic Interventions/Progress Updates:    Pt transferred from supine to sit EOB with modified independence during session.  He was able to donn his shoes with use of the shoe funnel and the reacher, with shoes containing elastic shoe laces.  He stood with use of the RW for support to step into the shoe and pull the right heel up over his foot.  He ambulated to the ADL apartment with modified independence, adhering to precautions.  He was able to complete tub/shower transfer with min assist secondary to not having enough room to bring his RLE out.  Feel he will be able to complete this with supervision with his tub at home as it is much bigger.  Transitioned to the kitchen for simulated meal prep using the RW.  Pt educated on use of the counter tops for help with positioning items and transporting them around the room with modified independence.  Finished session with practice on bed transfers to regular bed with modified independence as well.  Returned to room at end of session with use of the RW.  Pt left sitting EOB with call button and phone in reach.    Therapy Documentation Precautions:  Precautions Precautions: Posterior Hip, Fall Precaution Booklet Issued: Yes (comment) Precaution Comments: Pt able to recall precautions with 100% accuracy.  Required Braces or Orthoses: Other Brace/Splint Other Brace/Splint: hip abduction brace Restrictions Weight Bearing Restrictions: Yes RLE Weight Bearing: Partial weight bearing RLE Partial Weight Bearing Percentage or Pounds: 30%  Pain: Pain Assessment Pain Assessment: 0-10 Pain Score: 6  Pain Type: Acute  pain Pain Location: Hip Pain Orientation: Right Pain Descriptors / Indicators: Discomfort Pain Frequency: Constant Pain Onset: On-going Patients Stated Pain Goal: 5 Pain Intervention(s): Medication (See eMAR) ADL: See Function Navigator for Current Functional Status.   Therapy/Group: Individual Therapy  Issai Werling OTR/L 10/05/2016, 12:15 PM

## 2016-10-05 NOTE — Patient Care Conference (Signed)
Inpatient RehabilitationTeam Conference and Plan of Care Update Date: 10/05/2016   Time: 2:35 PM    Patient Name: Russell Steele      Medical Record Number: [id number]  Date of Birth: 01-12-1956 Sex: Male         Room/Bed: 4M12C/4M12C-01 Payor Info: Payor: MEDICARE / Plan: MEDICARE PART A / Product Type: *No Product type* /    Admitting Diagnosis: Peirpothic Femur FX  Admit Date/Time:  09/30/2016  6:44 PM Admission Comments: No comment available   Primary Diagnosis:  <principal problem not specified> Principal Problem: <principal problem not specified>  Patient Active Problem List   Diagnosis Date Noted  . Chronic bilateral low back pain   . Hyperglycemia   . Hypoalbuminemia due to protein-calorie malnutrition (HCC)   . Benign prostatic hyperplasia   . Constipation due to pain medication   . Periprosthetic fracture around internal prosthetic hip joint 09/30/2016  . Adjustment disorder with mixed anxiety and depressed mood   . Tobacco abuse   . OSA (obstructive sleep apnea)   . History of right hip replacement   . Post-operative pain   . Acute blood loss anemia   . Stage 3 chronic kidney disease   . Periprosthetic fracture around internal prosthetic hip joint, initial encounter 09/27/2016  . Periprosthetic fracture around internal prosthetic right hip joint (HCC) 09/26/2016  . 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 Maryla Morrow Social Worker Present: Dossie Der, LCSW Nurse Present: Chana Bode, RN PT Present: Other (comment) Levon Hedger) OT Present: Perrin Maltese, OT PPS Coordinator present : Tora Duck, RN, CRRN     Current Status/Progress Goal Weekly Team Focus  Medical   Decreased functional mobility secondary to right hip periprosthetic fracture status post ORIF. Partial weightbearing 30 pounds with recent right total hip arthroplasty  08/10/2016 with posterior hip precautions with history of chronic pain syndrome  Improve pain, weakness  See above   Bowel/Bladder        cont B & B     Swallow/Nutrition/ Hydration             ADL's   supervision to modified independent for bathing, dressing, toilet transfers, and shower/tub transfers.  modified independent for everything except shower/tub transfers at supervision level  selfcare retraining, transfer training, balance retraining, AE/DME education, pt education   Mobility   supervision to modified independent with bed mobility, transfers, ambulation  mod I to supervision level with gait, transfers, bed mobility.   Reinforce precautions with mobility, progress ambulation, stairs, transfers. Reinforce weightbearing restrictions with ambulation and stairs.    Communication             Safety/Cognition/ Behavioral Observations            Pain     managed with medicines   pain managed     Skin                *See Care Plan and progress notes for long and short-term goals.  Barriers to Discharge: Pain, ABLA, hyperglycemia, mobility No barriers   Possible Resolutions to Barriers:  optimize pain meds, therapies, good progress toward d/c None   Discharge Planning/Teaching Needs:    Home with wife who requires care and niece who is here for a few weeks to assist with the both of them.     Team Discussion:  Goals of mod/i level, reaching these goals quickly. Medically  ready for DC tomorrow. Has DME and will resume follow up through [facility].   Revisions to Treatment Plan:  DC 6/5   Continued Need for Acute Rehabilitation Level of Care: The patient requires daily medical management by a physician with specialized training in physical medicine and rehabilitation for the following conditions: Daily direction of a multidisciplinary physical rehabilitation program to ensure safe treatment while eliciting the highest outcome that is of practical value to the  patient.: Yes Daily medical management of patient stability for increased activity during participation in an intensive rehabilitation regime.: Yes Daily analysis of laboratory values and/or radiology reports with any subsequent need for medication adjustment of medical intervention for : Post surgical problems;Other  Larri Brewton, Lemar LivingsRebecca G 10/05/2016, 2:35 PM

## 2016-10-05 NOTE — Progress Notes (Signed)
Occupational Therapy Discharge Summary  Patient Details  Name: Russell Steele MRN: [id number] Date of Birth: 1955-06-26  Today's Date: 10/05/2016 OT Individual Time: 1420-1526 OT Individual Time Calculation (min): 66 min   Session Note:  Pt completed transfer into the walk-in shower with supervision.  He stood to remove LB clothing with use of the RW for support to maintain WBing status over the RLE.  He was able to bathe sit to stand with modified independence using the grab bar for support on the right side.  LH sponge and reacher utilized for washing and drying lower legs as well as for donning underpants and shorts.  Noted bandage wet over incision as well even though it was supposed to be safe for showering.  Nursing made aware and new bandage applied.  Finished session with pt donning hip abduction brace and returning to bed.  Patient has met 11 of 11 long term goals due to improved activity tolerance, improved balance and ability to compensate for deficits.  Patient to discharge at overall Modified Independent level.  Patient's care partner unavailable to provide the necessary physical assistance at discharge.    Reasons goals not met: NA  Recommendation:  Feel pt does not need any follow-up post CIR level rehab at this time.  He is able to demonstrate safe completion of selfcare tasks with modified independence with use of AE.    Equipment: tub bench  Reasons for discharge: treatment goals met and discharge from hospital  Patient/family agrees with progress made and goals achieved: Yes  OT Discharge Precautions/Restrictions  Precautions Precautions: Posterior Hip;Fall  Vital Signs Therapy Vitals Temp: 98.8 F (37.1 C) Temp Source: Oral Pulse Rate: 76 Resp: 18 BP: 125/66 Patient Position (if appropriate): Lying Oxygen Therapy SpO2: 97 % O2 Device: Not Delivered Pain Pain Assessment Pain Assessment: 0-10 Pain Score: 4  Pain Type: Acute pain Pain Location: Hip Pain  Orientation: Right Pain Descriptors / Indicators: Discomfort;Grimacing Pain Onset: With Activity Pain Intervention(s): Repositioned Multiple Pain Sites: No ADL   See Function section of chart for details Vision Baseline Vision/History: Wears glasses Wears Glasses: At all times Patient Visual Report: No change from baseline Eye Alignment: Within Functional Limits Perception  Perception: Within Functional Limits Praxis Praxis: Intact Cognition Overall Cognitive Status: Within Functional Limits for tasks assessed Arousal/Alertness: Awake/alert Orientation Level: Oriented X4 Attention: Sustained;Selective Sustained Attention: Appears intact Selective Attention: Appears intact Memory: Appears intact Awareness: Appears intact Problem Solving: Appears intact Safety/Judgment: Appears intact Sensation Sensation Light Touch: Appears Intact Stereognosis: Appears Intact Hot/Cold: Appears Intact Proprioception: Appears Intact Coordination Gross Motor Movements are Fluid and Coordinated: Yes Fine Motor Movements are Fluid and Coordinated: Yes Motor  Motor Motor: Within Functional Limits Mobility  Transfers Transfers: Sit to Stand;Stand to Sit Sit to Stand: 6: Modified independent (Device/Increase time);From bed;With upper extremity assist Stand to Sit: 6: Modified independent (Device/Increase time);To bed;With upper extremity assist  Trunk/Postural Assessment  Cervical Assessment Cervical Assessment: Within Functional Limits Thoracic Assessment Thoracic Assessment: Within Functional Limits Lumbar Assessment Lumbar Assessment: Within Functional Limits Postural Control Postural Control: Within Functional Limits  Balance Balance Balance Assessed: Yes Static Sitting Balance Static Sitting - Balance Support: No upper extremity supported Static Sitting - Level of Assistance: 7: Independent Dynamic Sitting Balance Dynamic Sitting - Balance Support: No upper extremity  supported Dynamic Sitting - Level of Assistance: 7: Independent Static Standing Balance Static Standing - Balance Support: Bilateral upper extremity supported Static Standing - Level of Assistance: 6: Modified independent (Device/Increase time) Dynamic Standing Balance  Dynamic Standing - Balance Support: Bilateral upper extremity supported Dynamic Standing - Level of Assistance: 6: Modified independent (Device/Increase time) Extremity/Trunk Assessment RUE Assessment RUE Assessment: Within Functional Limits LUE Assessment LUE Assessment: Within Functional Limits   See Function Navigator for Current Functional Status.  MCGUIRE,JAMES  OTR/L 10/05/2016, 4:21 PM  

## 2016-10-05 NOTE — Discharge Summary (Signed)
Discharge summary job [id number]

## 2016-10-05 NOTE — Progress Notes (Signed)
Physical Therapy Discharge Summary  Patient Details  Name: Russell Steele MRN: [id number] Date of Birth: [birth year]  Today's Date: 10/05/2016 PT Individual Time: 1002-1100 PT Individual Time Calculation (min): 58 min    Patient has met 10 of 10 long term goals due to improved activity tolerance, improved balance, increased strength and decreased pain.  Patient to discharge at an ambulatory level Modified Independent.   Patient's care spouse will not be able to provided physical assistance at home. The pt does report that his niece will be staying with them at D/C and she can provide assistance if needed. No family was able to come for education during the patient's stay.  The patient's niece is able to provide the necessary physical assistance at discharge.  Recommendation:  Patient will benefit from ongoing skilled PT services in home health setting to continue to advance safe functional mobility, address ongoing impairments in strength, balance, ambulation, ROM, general functional mobility, and minimize fall risk.  Equipment: No equipment provided, pt reports having all needed equipment at home.   Reasons for discharge: treatment goals met  Patient/family agrees with progress made and goals achieved: Yes  Session: Pt in bed upon arrival, agreeable to PT session once pain meds provided. Pt able to recall all hip precautions with 100% accuracy. HEP was provided and reviewed with the pt who denies any questions or concerns. Encouraged gradual walking program in safe environment. Pt able to demonstrate mod I mobility with bed mobility and transfers. Pt able to perform transfers from variable surfaces including bed, chair, mat table. Ambulation performed, X180 ft at mod I level using rw. Pt able to ambulate in smaller area replicating home environment as well. Stairs performed with supervision, reinforcing 30% weightbearing restriction. Following session, pt up in w/c per his request and able to reach  all needs. Patient denies any questions or concerns.      PT Discharge Precautions/Restrictions Precautions Precautions: Posterior Hip;Fall Precaution Comments: Pt able to recall precautions with 100% accuracy.  Required Braces or Orthoses: Other Brace/Splint Other Brace/Splint: hip abduction brace Restrictions Weight Bearing Restrictions: Yes RLE Weight Bearing: Partial weight bearing Pain  Rates pain as 7/10, pain meds provided during session.       See Function Navigator for Current Functional Status.  Linard Millers, PT 10/05/2016, 4:08 PM

## 2016-10-05 NOTE — Progress Notes (Signed)
Social Work  Discharge Note  The overall goal for the admission was met for:   Discharge location: Yes-HOME WITH WIFE AND NIECE-[name] WHO IS HERE TO HELP BOTH OF THEM FOR A SHORT TIME.  Length of Stay: Yes-6 DAYS  Discharge activity level: Yes-MOD/I LEVEL  Home/community participation: Yes  Services provided included: MD, RD, PT, OT, RN, CM, Pharmacy and SW  Financial Services: Medicare and Private Insurance: [facility]  Follow-up services arranged: Home Health: [place], DME: [facility] and Patient/Family request agency HH: [facility] , DME: NO PREF  Comments (or additional information):PT REACHED HIS GOALS QUICKLY AND READY [place]. TO FOLLOW UP WITH [facility] MD AND SURGEON  Patient/Family verbalized understanding of follow-up arrangements: Yes  Individual responsible for coordination of the follow-up plan: SELF & [name]-NIECE  Confirmed correct DME delivered: Russell Steele 10/05/2016    Russell Steele

## 2016-10-05 NOTE — Discharge Summary (Signed)
NAMEANAN, DAPOLITO                ACCOUNT NO.:  [id number]  MEDICAL RECORD NO.:  [id number]  LOCATION:                                 FACILITY:  PHYSICIAN:  Maryla Morrow, MD        DATE OF BIRTH:  12/11/1955  DATE OF ADMISSION:  09/30/2016 DATE OF DISCHARGE:  10/06/2016                              DISCHARGE SUMMARY   DISCHARGE DIAGNOSES: 1. Right hip periprosthetic fracture with open reduction and internal     fixation with recent right total hip arthroplasty on August 10, 2016. 2. Eliquis for deep venous thrombosis prophylaxis. 3. Chronic back pain. 4. Depression. 5. Acute blood loss anemia. 6. Hyperlipidemia. 7. Benign prostatic hypertrophy. 8. History of tobacco abuse. 9. Constipation. 10.Decreased nutritional storage.  HISTORY:  A 61 year old right-handed male, with history of anxiety, depression, tobacco abuse with a right hip total arthroplasty on August 10, 2016.  Lives with spouse, independent prior to hip replacement.  Wife with a history of stroke and can provide no assistance.  Presented on Sep 27, 2016, after a fall.  Inability to bear weight on the right leg. X-rays and imaging revealed right periprosthetic fracture.  Underwent ORIF on Sep 27, 2016, per Dr. Linna Caprice.  HOSPITAL COURSE:  Pain management.  Partial weightbearing on right lower extremity, fitted with a hip abduction brace as well as posterior hip precautions.  Acute blood loss anemia, 8.5 and monitored.  He remained on Eliquis for DVT prophylaxis.  Physical and occupational therapy are ongoing.  The patient was admitted for a comprehensive rehab program.  PAST MEDICAL HISTORY:  See discharge diagnoses.  SOCIAL HISTORY:  Lives with wife who has had a history of CVA.  He has a niece who can assist.  FUNCTIONAL STATUS:  Upon admission to rehab services, moderate assist 30 feet with rolling walker.  Moderate assist supine to sit, min to mod assist in activities of daily living.  PHYSICAL EXAMINATION:   VITAL SIGNS:  Blood pressure 137/67, pulse 89, temperature 99, respirations 18. GENERAL:  Alert male, in no acute distress oriented x3. EYES:  EOMs intact. NECK:  Supple.  Nontender.  No JVD. CARDIAC:  Regular rate and rhythm.  No murmur. ABDOMEN:  Soft, nontender.  Good bowel sounds. LUNGS:  Clear to auscultation without wheeze. MUSCULOSKELETAL:  Hip incision clean and dry with hip abduction brace in place.  REHABILITATION HOSPITAL COURSE:  The patient was admitted to inpatient rehab services with therapies initiated on a 3-hour daily basis, consisting of physical therapy, occupational therapy, and rehabilitation nursing.  The following issues were addressed during the patient's rehabilitation stay.  Pertaining to Mr. Delduca, ORIF of right periprosthetic fracture remained stable, partial weightbearing, neurovascular sensation intact.  Hip abduction brace in place with posterior hip precautions from recent right total hip arthroplasty on August 10, 2016.  He would follow up with Orthopedic Services.  He remained on Eliquis for DVT prophylaxis.  Vascular studies negative. Chronic back pain on Neurontin 600 mg t.i.d., Robaxin as needed as well as oxycodone 15 mg every 3 hours.  Discussed at length the need for tapering of medications.  The patient stated he [facility].  Anxiety and depression, he remained on Prozac as well as BuSpar with emotional support provided.  Acute blood loss anemia, stable, no bleeding episodes.  He had a history of BPH.  He remained on Flomax.  No dysuria or hematuria noted.  Bouts of constipation resolved with laxative assistance.  The patient received weekly collaborative interdisciplinary team conferences to discuss estimated length of stay, family teaching, any barriers to discharge. He was ambulating 200 feet with rolling walker, up and down stairs supervision, couch transfers performed, rolling walker supervision.   He could gather his belongings for activities of daily living and homemaking.  Discussed at length.  No driving.  His niece was to provide assistance at home.  DISCHARGE MEDICATIONS: 1. Eliquis 2.5 mg p.o. b.i.d. 2. BuSpar 50 mg p.o. q.i.d. 3. Colace 100 mg p.o. b.i.d. 4. Prozac 20 mg p.o. daily. 5. Neurontin 600 mg p.o. t.i.d. 6. Multivitamin daily. 7. Zocor 20 mg at bedtime. 8. Flomax 0.4 mg at bedtime. 9. Robaxin 750 mg p.o. q.i.d. as needed muscle spasms. 10.Oxycodone immediate release 15 mg every 3 hours as needed pain.  DIET:  Regular.  Wound. Follow-up 3-5 days with Dr. Linna CapriceSwinteck for removal of staples from hip incision  SPECIAL INSTRUCTIONS:  Partial weightbearing on right lower extremity with hip abduction brace, posterior hip precautions.  No driving.  The patient will follow up with Dr. Maryla MorrowAnkit Patel at the outpatient rehab service office as needed, Dr. Samson FredericBrian Swinteck of [facility]. The patient's primary [facility] provider [facility].     Mariam Dollaraniel Angiulli, P.A.   ______________________________ Maryla MorrowAnkit Patel, MD    DA/MEDQ  D:  10/05/2016  T:  10/05/2016  Job:  [id number]  cc:   Samson FredericBrian Swinteck, MD Maryla MorrowAnkit Patel, MD

## 2016-10-06 DIAGNOSIS — R739 Hyperglycemia, unspecified: Secondary | ICD-10-CM

## 2016-10-06 MED ORDER — TAMSULOSIN HCL 0.4 MG PO CAPS: 0.4000 mg | ORAL_CAPSULE | Freq: Every day | ORAL | 0 refills | Status: AC

## 2016-10-06 MED ORDER — BUSPIRONE HCL 15 MG PO TABS: 15.0000 mg | ORAL_TABLET | Freq: Four times a day (QID) | ORAL | 0 refills | Status: AC

## 2016-10-06 MED ORDER — APIXABAN 2.5 MG PO TABS: 2.5000 mg | ORAL_TABLET | Freq: Two times a day (BID) | ORAL | 0 refills | Status: DC

## 2016-10-06 MED ORDER — SIMVASTATIN 20 MG PO TABS: 20.0000 mg | ORAL_TABLET | Freq: Every day | ORAL | 0 refills | Status: AC

## 2016-10-06 MED ORDER — METHOCARBAMOL 750 MG PO TABS: 750.0000 mg | ORAL_TABLET | Freq: Four times a day (QID) | ORAL | 0 refills | Status: DC | PRN

## 2016-10-06 MED ORDER — GABAPENTIN 300 MG PO CAPS: 600.0000 mg | ORAL_CAPSULE | Freq: Three times a day (TID) | ORAL | 0 refills | Status: AC

## 2016-10-06 MED ORDER — FLUOXETINE HCL 20 MG PO CAPS: 20.0000 mg | ORAL_CAPSULE | Freq: Every day | ORAL | 3 refills | Status: AC

## 2016-10-06 MED ORDER — OXYCODONE HCL 15 MG PO TABS: 15.0000 mg | ORAL_TABLET | ORAL | 0 refills | Status: DC | PRN

## 2016-10-06 NOTE — Progress Notes (Signed)
PHYSICAL MEDICINE & REHABILITATION     PROGRESS NOTE  Subjective/Complaints:  Pt seen laying in bed this AM.  He states he is sore this AM.  He is ready for discharge.   ROS: Denies CP, SOB, N/V/D.  Objective: Vital Signs: Blood pressure 121/68, pulse 72, temperature 98.6 F (37 C), temperature source Oral, resp. rate 18, height 6\' 3"  (1.905 m), weight 118.5 kg (261 lb 3.2 oz), SpO2 99 %. No results found. No results for input(s): WBC, HGB, HCT, PLT in the last 72 hours. No results for input(s): NA, K, CL, GLUCOSE, BUN, CREATININE, CALCIUM in the last 72 hours.  Invalid input(s): CO CBG (last 3)  No results for input(s): GLUCAP in the last 72 hours.  Wt Readings from Last 3 Encounters:  09/30/16 118.5 kg (261 lb 3.2 oz)  09/29/16 123.5 kg (272 lb 4.8 oz)  08/10/16 119.4 kg (263 lb 4 oz)    Physical Exam:  BP 121/68 (BP Location: Left Arm)   Pulse 72   Temp 98.6 F (37 C) (Oral)   Resp 18   Ht 6\' 3"  (1.905 m)   Wt 118.5 kg (261 lb 3.2 oz)   SpO2 99%   BMI 32.65 kg/m  Constitutional: He appears well-developed and well-nourished.  HENT: Normocephalic and atraumatic.  Eyes: EOMI. No discharge.  Cardiovascular: RRR. No JVD. Respiratory: Effort normal and breath sounds normal GI: Soft. Bowel sounds are normal.  Musculoskeletal: He exhibits edema and mild tenderness. Neurological: He is alert and oriented.  Motor: B/l UE 5/5 proximal to distal LLE: HF, KE 4+/5, ADF/PF 5/5 RLE: HF 4/5 (pain inhibition, improving), ADF/PF 5/5  Skin: Hip incision with staples c/d/i with abduction brace in place.  Psychiatric: pleasant. NAD.  Assessment/Plan: 1. Functional deficits secondary to right hip periprosthetic fracture status post ORIF which require 3+ hours per day of interdisciplinary therapy in a comprehensive inpatient rehab setting. Physiatrist is providing close team supervision and 24 hour management of active medical problems listed below. Physiatrist and rehab  team continue to assess barriers to discharge/monitor patient progress toward functional and medical goals.  Function:  Bathing Bathing position   Position: Shower  Bathing parts Body parts bathed by patient: Right arm, Left arm, Chest, Abdomen, Front perineal area, Buttocks, Right lower leg, Left lower leg, Left upper leg, Back Body parts bathed by helper: Right lower leg, Left lower leg, Back  Bathing assist Assist Level: More than reasonable time      Upper Body Dressing/Undressing Upper body dressing   What is the patient wearing?: Pull over shirt/dress     Pull over shirt/dress - Perfomed by patient: Thread/unthread right sleeve, Thread/unthread left sleeve, Put head through opening, Pull shirt over trunk          Upper body assist Assist Level: More than reasonable time      Lower Body Dressing/Undressing Lower body dressing   What is the patient wearing?: Underwear, Pants, Non-skid slipper socks, Ted Hose Underwear - Performed by patient: Thread/unthread right underwear leg, Thread/unthread left underwear leg, Pull underwear up/down Underwear - Performed by helper: Thread/unthread right underwear leg, Thread/unthread left underwear leg Pants- Performed by patient: Thread/unthread right pants leg, Thread/unthread left pants leg, Pull pants up/down   Non-skid slipper socks- Performed by patient: Don/doff right sock, Don/doff left sock Non-skid slipper socks- Performed by helper: Don/doff right sock, Don/doff left sock     Shoes - Performed by patient: Don/doff right shoe, Don/doff left shoe  TED Hose - Performed by helper: Don/doff right TED hose, Don/doff left TED hose  Lower body assist Assist for lower body dressing: Supervision or verbal cues Assistive Device Comment: sock aid and reacher    Toileting Toileting   Toileting steps completed by patient: Adjust clothing prior to toileting, Performs perineal hygiene, Adjust clothing after toileting    Toileting Assistive Devices: Grab bar or rail  Toileting assist Assist level: More than reasonable time   Transfers Chair/bed transfer   Chair/bed transfer method: Ambulatory Chair/bed transfer assist level: No Help, no cues, assistive device, takes more than a reasonable amount of time Chair/bed transfer assistive device: Armrests, Patent attorneyWalker     Locomotion Ambulation     Max distance: 180 ft Assist level: No help, No cues, assistive device, takes more than a reasonable amount of time   Wheelchair   Type: Manual Max wheelchair distance: 200 ft Assist Level: No help, No cues, assistive device, takes more than reasonable amount of time  Cognition Comprehension Comprehension assist level: Follows complex conversation/direction with extra time/assistive device  Expression Expression assist level: Expresses complex ideas: With no assist  Social Interaction Social Interaction assist level: Interacts appropriately 90% of the time - Needs monitoring or encouragement for participation or interaction.  Problem Solving Problem solving assist level: Solves complex 90% of the time/cues < 10% of the time  Memory Memory assist level: Recognizes or recalls 90% of the time/requires cueing < 10% of the time    Medical Problem List and Plan: 1.  Decreased functional mobility secondary to right hip periprosthetic fracture status post ORIF. Partial weightbearing 30 pounds with recent right total hip arthroplasty 08/10/2016 with posterior hip precautions  D/c today 2.  DVT Prophylaxis/Anticoagulation: Eliquis.   Vascular study Neg 3. Pain Management/chronic back pain: Neurontin 600 mg 3 times a day,  Roxi changed to 15 q4 PRN   Will discharge with minimal narcotics as pt [facility] 4. Mood: Prozac 20 mg daily, BuSpar 15 mg 4 times a day 5. Neuropsych: This patient is capable of making decisions on his own behalf. 6. Skin/Wound Care: Routine skin checks 7.  Fluids/Electrolytes/Nutrition: Routine I&Os   BMP within acceptable range  8. Acute blood loss anemia.   Hb 8.2 on 5/31  Cont to monitor 9. Hyperlipidemia. Zocor 10. BPH. Flomax  Monitor for retention 11. History of tobacco abuse. Counseling 12. Constipation. Laxative assistance 13. Hyperglycemia  Likely stress induced  Monitor with increased activity 14. Hypoalbuminemia  Supplement initiated 5/31  LOS (Days) 6 A FACE TO FACE EVALUATION WAS PERFORMED  Ankit Karis Jubanil Patel 10/06/2016 9:04 AM

## 2016-10-06 NOTE — Progress Notes (Signed)
Patient discharged to home with family at [id number] and with all discharge instructions and all belongings. Patient stable.

## 2016-10-06 NOTE — Discharge Instructions (Signed)
Inpatient Rehab Discharge Instructions  Russell CampVictor Steele Discharge date and time: No discharge date for patient encounter.   Activities/Precautions/ Functional Status: Activity: Partial weightbearing right lower extremity with hip abduction brace and posterior hip precautions Diet: regular diet Wound Care: keep wound clean and dry Functional status:  ___ No restrictions     ___ Walk up steps independently ___ 24/7 supervision/assistance   ___ Walk up steps with assistance ___ Intermittent supervision/assistance  ___ Bathe/dress independently ___ Walk with walker     _x__ Bathe/dress with assistance ___ Walk Independently    ___ Shower independently ___ Walk with assistance    ___ Shower with assistance ___ No alcohol     ___ Return to work/school ________  Special Instructions:  No driving  Follow-up with Dr.Swinteck 3-5 days for removal of staples   COMMUNITY REFERRALS UPON DISCHARGE:    Home Health:   PT  & OT  Agency:[facility]   [phone]   Date of last service:10/06/2016  Medical Equipment/Items Ordered: TUB BENCH  Agency/Supplier:[facility]  [phone]   My questions have been answered and I understand these instructions. I will adhere to these goals and the provided educational materials after my discharge from the hospital.  Patient/Caregiver Signature _______________________________ Date __________  Clinician Signature _______________________________________ Date __________  Please bring this form and your medication list with you to all your follow-up doctor's appointments.

## 2017-08-09 ENCOUNTER — Ambulatory Visit: Payer: Self-pay | Admitting: Orthopedic Surgery

## 2017-08-09 NOTE — Patient Instructions (Addendum)
Russell Steele  08/09/2017   Your procedure is scheduled on: 08/26/2017   Report to [facility]  Entrance Report to admitting at 0715 AM     Call this number if you have problems the morning of surgery [phone]   Remember: Do not eat food or drink liquids :After Midnight.     Take these medicines the morning of surgery with A SIP OF WATER: Buspirone (Buspar), Fluoxetine (Prozac), Gabapentin (Neurontin).You may also bring and use your nasal spray as needed.                               You may not have any metal on your body including hair pins and              piercings  Do not wear jewelry, lotions, powders or deodorant             Men may shave face and neck.   Do not bring valuables to the hospital. [place] IS NOT             RESPONSIBLE   FOR VALUABLES.  Contacts, dentures or bridgework may not be worn into surgery.  Leave suitcase in the car. After surgery it may be brought to your room.     Special Instructions: Please bring your mask and tubing for your CPAP machine               Please read over the following fact sheets you were given: _____________________________________________________________________             [facility] - Preparing for Surgery Before surgery, you can play an important role.  Because skin is not sterile, your skin needs to be as free of germs as possible.  You can reduce the number of germs on your skin by washing with CHG (chlorahexidine gluconate) soap before surgery.  CHG is an antiseptic cleaner which kills germs and bonds with the skin to continue killing germs even after washing. Please DO NOT use if you have an allergy to CHG or antibacterial soaps.  If your skin becomes reddened/irritated stop using the CHG and inform your nurse when you arrive at Short Stay. Do not shave (including legs and underarms) for at least 48 hours prior to the first CHG shower.  You may shave your face/neck. Please follow these  instructions carefully:  1.  Shower with CHG Soap the night before surgery and the  morning of Surgery.  2.  If you choose to wash your hair, wash your hair first as usual with your  normal  shampoo.  3.  After you shampoo, rinse your hair and body thoroughly to remove the  shampoo.                           4.  Use CHG as you would any other liquid soap.  You can apply chg directly  to the skin and wash                       Gently with a scrungie or clean washcloth.  5.  Apply the CHG Soap to your body ONLY FROM THE NECK DOWN.   Do not use on face/ open  Wound or open sores. Avoid contact with eyes, ears mouth and genitals (private parts).                       Wash face,  Genitals (private parts) with your normal soap.             6.  Wash thoroughly, paying special attention to the area where your surgery  will be performed.  7.  Thoroughly rinse your body with warm water from the neck down.  8.  DO NOT shower/wash with your normal soap after using and rinsing off  the CHG Soap.                9.  Pat yourself dry with a clean towel.            10.  Wear clean pajamas.            11.  Place clean sheets on your bed the night of your first shower and do not  sleep with pets. Day of Surgery : Do not apply any lotions/deodorants the morning of surgery.  Please wear clean clothes to the hospital/surgery center.  FAILURE TO FOLLOW THESE INSTRUCTIONS MAY RESULT IN THE CANCELLATION OF YOUR SURGERY PATIENT SIGNATURE_________________________________  NURSE SIGNATURE__________________________________  ________________________________________________________________________  WHAT IS A BLOOD TRANSFUSION? Blood Transfusion Information  A transfusion is the replacement of blood or some of its parts. Blood is made up of multiple cells which provide different functions.  Red blood cells carry oxygen and are used for blood loss replacement.  White blood cells fight against  infection.  Platelets control bleeding.  Plasma helps clot blood.  Other blood products are available for specialized needs, such as hemophilia or other clotting disorders. BEFORE THE TRANSFUSION  Who gives blood for transfusions?   Healthy volunteers who are fully evaluated to make sure their blood is safe. This is blood bank blood. Transfusion therapy is the safest it has ever been in the practice of medicine. Before blood is taken from a donor, a complete history is taken to make sure that person has no history of diseases nor engages in risky social behavior (examples are intravenous drug use or sexual activity with multiple partners). The donor's travel history is screened to minimize risk of transmitting infections, such as malaria. The donated blood is tested for signs of infectious diseases, such as HIV and hepatitis. The blood is then tested to be sure it is compatible with you in order to minimize the chance of a transfusion reaction. If you or a relative donates blood, this is often done in anticipation of surgery and is not appropriate for emergency situations. It takes many days to process the donated blood. RISKS AND COMPLICATIONS Although transfusion therapy is very safe and saves many lives, the main dangers of transfusion include:   Getting an infectious disease.  Developing a transfusion reaction. This is an allergic reaction to something in the blood you were given. Every precaution is taken to prevent this. The decision to have a blood transfusion has been considered carefully by your caregiver before blood is given. Blood is not given unless the benefits outweigh the risks. AFTER THE TRANSFUSION  Right after receiving a blood transfusion, you will usually feel much better and more energetic. This is especially true if your red blood cells have gotten low (anemic). The transfusion raises the level of the red blood cells which carry oxygen, and this usually causes an energy  increase.  The nurse administering the transfusion will monitor you carefully for complications. HOME CARE INSTRUCTIONS  No special instructions are needed after a transfusion. You may find your energy is better. Speak with your caregiver about any limitations on activity for underlying diseases you may have. SEEK MEDICAL CARE IF:   Your condition is not improving after your transfusion.  You develop redness or irritation at the intravenous (IV) site. SEEK IMMEDIATE MEDICAL CARE IF:  Any of the following symptoms occur over the next 12 hours:  Shaking chills.  You have a temperature by mouth above 102 F (38.9 C), not controlled by medicine.  Chest, back, or muscle pain.  People around you feel you are not acting correctly or are confused.  Shortness of breath or difficulty breathing.  Dizziness and fainting.  You get a rash or develop hives.  You have a decrease in urine output.  Your urine turns a dark color or changes to pink, red, or [place]. Any of the following symptoms occur over the next 10 days:  You have a temperature by mouth above 102 F (38.9 C), not controlled by medicine.  Shortness of breath.  Weakness after normal activity.  The white part of the eye turns yellow (jaundice).  You have a decrease in the amount of urine or are urinating less often.  Your urine turns a dark color or changes to pink, red, or [place]. Document Released: 04/17/2000 Document Revised: 07/13/2011 Document Reviewed: 12/05/2007 [facility] Patient Information 2014 [place], [place].  _______________________________________________________________________

## 2017-08-09 NOTE — Progress Notes (Signed)
07-23-17 Surgical clearance from Dr. Jules Husbandsyer on chart

## 2017-08-10 ENCOUNTER — Other Ambulatory Visit: Payer: Self-pay

## 2017-08-10 ENCOUNTER — Encounter (HOSPITAL_COMMUNITY)
Admission: RE | Admit: 2017-08-10 | Discharge: 2017-08-10 | Disposition: A | Discharge status: Home / Self Care | Payer: No Typology Code available for payment source | Source: Ambulatory Visit | Attending: Orthopedic Surgery | Admitting: Orthopedic Surgery

## 2017-08-10 ENCOUNTER — Encounter (HOSPITAL_COMMUNITY): Payer: Self-pay

## 2017-08-10 DIAGNOSIS — M879 Osteonecrosis, unspecified: Secondary | ICD-10-CM | POA: Insufficient documentation

## 2017-08-10 DIAGNOSIS — Z01812 Encounter for preprocedural laboratory examination: Secondary | ICD-10-CM | POA: Insufficient documentation

## 2017-08-10 LAB — BASIC METABOLIC PANEL
Anion gap: 8 (ref 5–15)
BUN: 14 mg/dL (ref 6–20)
CALCIUM: 8.9 mg/dL (ref 8.9–10.3)
CO2: 29 mmol/L (ref 22–32)
CREATININE: 1.17 mg/dL (ref 0.61–1.24)
Chloride: 101 mmol/L (ref 101–111)
GFR calc non Af Amer: >60 mL/min (ref >60)
Glucose, Bld: 98 mg/dL (ref 65–99)
Potassium: 4.2 mmol/L (ref 3.5–5.1)
SODIUM: 138 mmol/L (ref 135–145)

## 2017-08-10 LAB — SURGICAL PCR SCREEN
MRSA, PCR: NEGATIVE
Staphylococcus aureus: NEGATIVE

## 2017-08-10 LAB — CBC
HCT: 42.1 % (ref 39.0–52.0)
Hemoglobin: 13.8 g/dL (ref 13.0–17.0)
MCH: 32.1 pg (ref 26.0–34.0)
MCHC: 32.8 g/dL (ref 30.0–36.0)
MCV: 97.9 fL (ref 78.0–100.0)
Platelets: 251 10*3/uL (ref 150–400)
RBC: 4.3 MIL/uL (ref 4.22–5.81)
RDW: 13.4 % (ref 11.5–15.5)
WBC: 5 10*3/uL (ref 4.0–10.5)

## 2017-08-11 ENCOUNTER — Ambulatory Visit: Payer: Self-pay | Admitting: Orthopedic Surgery

## 2017-08-11 NOTE — H&P (View-Only) (Signed)
TOTAL HIP ADMISSION H&P  Patient is admitted for left total hip arthroplasty.  Subjective:  Chief Complaint: left hip pain  HPI: Russell Steele, 62 y.o. male, has a history of pain and functional disability in the left hip(s) due to arthritis and patient has failed non-surgical conservative treatments for greater than 12 weeks to include NSAID's and/or analgesics, supervised PT with diminished ADL's post treatment, use of assistive devices and activity modification.  Onset of symptoms was gradual starting 2 years ago with rapidlly worsening course since that time.The patient noted no past surgery on the left hip(s).  Patient currently rates pain in the left hip at 10 out of 10 with activity. Patient has night pain, worsening of pain with activity and weight bearing, pain that interfers with activities of daily living and pain with passive range of motion. Patient has evidence of subchondral cysts, subchondral sclerosis, periarticular osteophytes, joint space narrowing and AVN by imaging studies. This condition presents safety issues increasing the risk of falls. This patient has had avascular necrosis of the hip.  There is no current active infection.  Patient Active Problem List   Diagnosis Date Noted  . Chronic bilateral low back pain   . Hyperglycemia   . Hypoalbuminemia due to protein-calorie malnutrition (HCC)   . Benign prostatic hyperplasia   . Constipation due to pain medication   . Periprosthetic fracture around internal prosthetic hip joint 09/30/2016  . Adjustment disorder with mixed anxiety and depressed mood   . Tobacco abuse   . OSA (obstructive sleep apnea)   . History of right hip replacement   . Post-operative pain   . Acute blood loss anemia   . Stage 3 chronic kidney disease (HCC)   . Periprosthetic fracture around internal prosthetic hip joint, initial encounter 09/27/2016  . Periprosthetic fracture around internal prosthetic right hip joint (HCC) 09/26/2016  . Avascular  necrosis of hip, right (HCC) 08/10/2016   Past Medical History:  Diagnosis Date  . Anxiety   . Arthritis   . Depression   . History of kidney stones   . Sleep apnea     Past Surgical History:  Procedure Laterality Date  . AMPUTATION TOE    . APPENDECTOMY    . BACK SURGERY    . DENTAL SURGERY    . KNEE ARTHROSCOPY Bilateral 2017  . ORIF FEMUR FRACTURE Right 09/26/2016  . ORIF PERIPROSTHETIC FRACTURE Right 09/27/2016   Procedure: OPEN REDUCTION INTERNAL FIXATION (ORIF) PERIPROSTHETIC FRACTURE;  Surgeon: Samson Frederic, MD;  Location: [facility] OR;  Service: Orthopedics;  Laterality: Right;  Femeral Component Rev, DEPUY AML Stem, Zimmer Cables, LATERAL POSITION WITH MARK II. POSTERIOR APPROACH.  . TOTAL HIP ARTHROPLASTY Right 08/10/2016   Procedure: RIGHT TOTAL HIP ARTHROPLASTY ANTERIOR APPROACH;  Surgeon: Samson Frederic, MD;  Location: [facility] OR;  Service: Orthopedics;  Laterality: Right;  . TOTAL HIP ARTHROPLASTY Right 09/27/2016   Procedure: TOTAL HIP ARTHROPLASTY revision of femoral component;  Surgeon: Samson Frederic, MD;  Location: [facility] OR;  Service: Orthopedics;  Laterality: Right;    Current Outpatient Medications  Medication Sig Dispense Refill Last Dose  . acetaminophen (TYLENOL) 500 MG tablet Take 1,000 mg by mouth every 6 (six) hours as needed for mild pain.   08/10/2017 at Unknown time  . albuterol (PROVENTIL HFA;VENTOLIN HFA) 108 (90 Base) MCG/ACT inhaler Inhale 2 puffs into the lungs every 6 (six) hours as needed for wheezing or shortness of breath.   Past Month at Unknown time  . apixaban (ELIQUIS) 2.5 MG TABS  tablet Take 1 tablet (2.5 mg total) by mouth 2 (two) times daily. (Patient not taking: Reported on 08/10/2017) 60 tablet 0 Not Taking at Unknown time  . aspirin 81 MG chewable tablet Chew 81 mg by mouth 2 (two) times daily.   08/10/2017 at Unknown time  . busPIRone (BUSPAR) 15 MG tablet Take 1 tablet (15 mg total) by mouth 4 (four) times daily. 120 tablet 0 08/10/2017 at Unknown time  .  diclofenac sodium (VOLTAREN) 1 % GEL Apply 1 application topically daily.   08/09/2017 at Unknown time  . diphenhydrAMINE (BENADRYL) 25 mg capsule Take 50 mg by mouth at bedtime.   08/09/2017 at Unknown time  . docusate sodium (COLACE) 100 MG capsule Take 1 capsule (100 mg total) by mouth 2 (two) times daily. 60 capsule 1 08/10/2017 at Unknown time  . FLUoxetine (PROZAC) 20 MG capsule Take 1 capsule (20 mg total) by mouth daily. (Patient taking differently: Take 20 mg by mouth 3 (three) times daily. ) 30 capsule 3 08/10/2017 at Unknown time  . gabapentin (NEURONTIN) 300 MG capsule Take 2 capsules (600 mg total) by mouth 3 (three) times daily. 90 capsule 0 08/10/2017 at Unknown time  . methocarbamol (ROBAXIN) 750 MG tablet Take 1 tablet (750 mg total) by mouth 4 (four) times daily as needed for muscle spasms. (Patient not taking: Reported on 08/10/2017) 60 tablet 0 Not Taking at Unknown time  . omeprazole (PRILOSEC) 20 MG capsule Take 20 mg by mouth daily.   08/09/2017 at Unknown time  . oxyCODONE (ROXICODONE) 15 MG immediate release tablet Take 1 tablet (15 mg total) by mouth every 4 (four) hours as needed for severe pain. (Patient taking differently: Take 15 mg by mouth every 8 (eight) hours as needed. ) 20 tablet 0 08/10/2017 at Unknown time  . Oxycodone HCl 10 MG TABS Take 10 mg by mouth daily.   08/10/2017 at Unknown time  . oxymetazoline (AFRIN) 0.05 % nasal spray Place 1 spray into both nostrils 2 (two) times daily as needed for congestion.   Not Taking at Unknown time  . simvastatin (ZOCOR) 20 MG tablet Take 1 tablet (20 mg total) by mouth at bedtime. 30 tablet 0 08/09/2017 at Unknown time  . tamsulosin (FLOMAX) 0.4 MG CAPS capsule Take 1 capsule (0.4 mg total) by mouth at bedtime. 30 capsule 0 08/09/2017 at Unknown time   No current facility-administered medications for this visit.    Allergies  Allergen Reactions  . Bee Venom Anaphylaxis    Social History   Tobacco Use  . Smoking status: Former Smoker     Packs/day: 0.50    Types: Cigarettes    Last attempt to quit: 01/12/2017    Years since quitting: 0.5  . Smokeless tobacco: Never Used  Substance Use Topics  . Alcohol use: Yes    Alcohol/week: 1.8 oz    Types: 3 Cans of beer per week    Comment: daily     No family history on file.   Review of Systems  Constitutional: Positive for malaise/fatigue.  HENT: Positive for tinnitus.   Eyes: Negative.   Respiratory: Negative.   Cardiovascular: Negative.   Gastrointestinal: Negative.   Genitourinary: Negative.   Musculoskeletal: Positive for joint pain.  Skin: Negative.   Neurological: Negative.   Endo/Heme/Allergies: Negative.   Psychiatric/Behavioral: Negative.     Objective:  Physical Exam  Vitals reviewed. Constitutional: He is oriented to person, place, and time. He appears well-developed and well-nourished.  HENT:  Head: Normocephalic  and atraumatic.  Eyes: Pupils are equal, round, and reactive to light. Conjunctivae and EOM are normal.  Neck: Normal range of motion. Neck supple.  Cardiovascular: Normal rate, regular rhythm and intact distal pulses.  Respiratory: Effort normal. No respiratory distress.  GI: Soft. He exhibits no distension.  Genitourinary:  Genitourinary Comments: deferred  Neurological: He is alert and oriented to person, place, and time. He has normal reflexes.  Skin: Skin is warm and dry.  Psychiatric: He has a normal mood and affect. His behavior is normal. Judgment and thought content normal.    Vital signs in last 24 hours: @VSRANGES @  Labs:   Estimated body mass index is 34.19 kg/m as calculated from the following:   Height as of 08/10/17: 6\' 3"  (1.905 m).   Weight as of 08/10/17: 124.1 kg (273 lb 8 oz).   Imaging Review Plain radiographs demonstrate severe degenerative joint disease of the left hip(s). The bone quality appears to be adequate for age and reported activity level.    Preoperative templating of the joint replacement has  been completed, documented, and submitted to the Operating Room personnel in order to optimize intra-operative equipment management.   Anticipated LOS equal to or greater than 2 midnights due to - Age 31 and older with one or more of the following:  - Obesity  - Expected need for hospital services (PT, OT, Nursing) required for safe  discharge  - Anticipated need for postoperative skilled nursing care or inpatient rehab  - Active co-morbidities: Chronic pain requiring opiods OR   - Unanticipated findings during/Post Surgery: None  - Patient is a high risk of re-admission due to: None     Assessment/Plan:  End stage arthritis, left hip(s)  The patient history, physical examination, clinical judgement of the provider and imaging studies are consistent with end stage degenerative joint disease of the left hip(s) and total hip arthroplasty is deemed medically necessary. The treatment options including medical management, injection therapy, arthroscopy and arthroplasty were discussed at length. The risks and benefits of total hip arthroplasty were presented and reviewed. The risks due to aseptic loosening, infection, stiffness, dislocation/subluxation,  thromboembolic complications and other imponderables were discussed.  The patient acknowledged the explanation, agreed to proceed with the plan and consent was signed. Patient is being admitted for inpatient treatment for surgery, pain control, PT, OT, prophylactic antibiotics, VTE prophylaxis, progressive ambulation and ADL's and discharge planning.The patient is planning to be discharged home with HEP

## 2017-08-11 NOTE — H&P (Signed)
TOTAL HIP ADMISSION H&P  Patient is admitted for left total hip arthroplasty.  Subjective:  Chief Complaint: left hip pain  HPI: Russell Steele, 62 y.o. male, has a history of pain and functional disability in the left hip(s) due to arthritis and patient has failed non-surgical conservative treatments for greater than 12 weeks to include NSAID's and/or analgesics, supervised PT with diminished ADL's post treatment, use of assistive devices and activity modification.  Onset of symptoms was gradual starting 2 years ago with rapidlly worsening course since that time.The patient noted no past surgery on the left hip(s).  Patient currently rates pain in the left hip at 10 out of 10 with activity. Patient has night pain, worsening of pain with activity and weight bearing, pain that interfers with activities of daily living and pain with passive range of motion. Patient has evidence of subchondral cysts, subchondral sclerosis, periarticular osteophytes, joint space narrowing and AVN by imaging studies. This condition presents safety issues increasing the risk of falls. This patient has had avascular necrosis of the hip.  There is no current active infection.  Patient Active Problem List   Diagnosis Date Noted  . Chronic bilateral low back pain   . Hyperglycemia   . Hypoalbuminemia due to protein-calorie malnutrition (HCC)   . Benign prostatic hyperplasia   . Constipation due to pain medication   . Periprosthetic fracture around internal prosthetic hip joint 09/30/2016  . Adjustment disorder with mixed anxiety and depressed mood   . Tobacco abuse   . OSA (obstructive sleep apnea)   . History of right hip replacement   . Post-operative pain   . Acute blood loss anemia   . Stage 3 chronic kidney disease (HCC)   . Periprosthetic fracture around internal prosthetic hip joint, initial encounter 09/27/2016  . Periprosthetic fracture around internal prosthetic right hip joint (HCC) 09/26/2016  . Avascular  necrosis of hip, right (HCC) 08/10/2016   Past Medical History:  Diagnosis Date  . Anxiety   . Arthritis   . Depression   . History of kidney stones   . Sleep apnea     Past Surgical History:  Procedure Laterality Date  . AMPUTATION TOE    . APPENDECTOMY    . BACK SURGERY    . DENTAL SURGERY    . KNEE ARTHROSCOPY Bilateral 2017  . ORIF FEMUR FRACTURE Right 09/26/2016  . ORIF PERIPROSTHETIC FRACTURE Right 09/27/2016   Procedure: OPEN REDUCTION INTERNAL FIXATION (ORIF) PERIPROSTHETIC FRACTURE;  Surgeon: Samson Frederic, MD;  Location: [facility] OR;  Service: Orthopedics;  Laterality: Right;  Femeral Component Rev, DEPUY AML Stem, Zimmer Cables, LATERAL POSITION WITH MARK II. POSTERIOR APPROACH.  . TOTAL HIP ARTHROPLASTY Right 08/10/2016   Procedure: RIGHT TOTAL HIP ARTHROPLASTY ANTERIOR APPROACH;  Surgeon: Samson Frederic, MD;  Location: [facility] OR;  Service: Orthopedics;  Laterality: Right;  . TOTAL HIP ARTHROPLASTY Right 09/27/2016   Procedure: TOTAL HIP ARTHROPLASTY revision of femoral component;  Surgeon: Samson Frederic, MD;  Location: [facility] OR;  Service: Orthopedics;  Laterality: Right;    Current Outpatient Medications  Medication Sig Dispense Refill Last Dose  . acetaminophen (TYLENOL) 500 MG tablet Take 1,000 mg by mouth every 6 (six) hours as needed for mild pain.   08/10/2017 at Unknown time  . albuterol (PROVENTIL HFA;VENTOLIN HFA) 108 (90 Base) MCG/ACT inhaler Inhale 2 puffs into the lungs every 6 (six) hours as needed for wheezing or shortness of breath.   Past Month at Unknown time  . apixaban (ELIQUIS) 2.5 MG TABS  tablet Take 1 tablet (2.5 mg total) by mouth 2 (two) times daily. (Patient not taking: Reported on 08/10/2017) 60 tablet 0 Not Taking at Unknown time  . aspirin 81 MG chewable tablet Chew 81 mg by mouth 2 (two) times daily.   08/10/2017 at Unknown time  . busPIRone (BUSPAR) 15 MG tablet Take 1 tablet (15 mg total) by mouth 4 (four) times daily. 120 tablet 0 08/10/2017 at Unknown time  .  diclofenac sodium (VOLTAREN) 1 % GEL Apply 1 application topically daily.   08/09/2017 at Unknown time  . diphenhydrAMINE (BENADRYL) 25 mg capsule Take 50 mg by mouth at bedtime.   08/09/2017 at Unknown time  . docusate sodium (COLACE) 100 MG capsule Take 1 capsule (100 mg total) by mouth 2 (two) times daily. 60 capsule 1 08/10/2017 at Unknown time  . FLUoxetine (PROZAC) 20 MG capsule Take 1 capsule (20 mg total) by mouth daily. (Patient taking differently: Take 20 mg by mouth 3 (three) times daily. ) 30 capsule 3 08/10/2017 at Unknown time  . gabapentin (NEURONTIN) 300 MG capsule Take 2 capsules (600 mg total) by mouth 3 (three) times daily. 90 capsule 0 08/10/2017 at Unknown time  . methocarbamol (ROBAXIN) 750 MG tablet Take 1 tablet (750 mg total) by mouth 4 (four) times daily as needed for muscle spasms. (Patient not taking: Reported on 08/10/2017) 60 tablet 0 Not Taking at Unknown time  . omeprazole (PRILOSEC) 20 MG capsule Take 20 mg by mouth daily.   08/09/2017 at Unknown time  . oxyCODONE (ROXICODONE) 15 MG immediate release tablet Take 1 tablet (15 mg total) by mouth every 4 (four) hours as needed for severe pain. (Patient taking differently: Take 15 mg by mouth every 8 (eight) hours as needed. ) 20 tablet 0 08/10/2017 at Unknown time  . Oxycodone HCl 10 MG TABS Take 10 mg by mouth daily.   08/10/2017 at Unknown time  . oxymetazoline (AFRIN) 0.05 % nasal spray Place 1 spray into both nostrils 2 (two) times daily as needed for congestion.   Not Taking at Unknown time  . simvastatin (ZOCOR) 20 MG tablet Take 1 tablet (20 mg total) by mouth at bedtime. 30 tablet 0 08/09/2017 at Unknown time  . tamsulosin (FLOMAX) 0.4 MG CAPS capsule Take 1 capsule (0.4 mg total) by mouth at bedtime. 30 capsule 0 08/09/2017 at Unknown time   No current facility-administered medications for this visit.    Allergies  Allergen Reactions  . Bee Venom Anaphylaxis    Social History   Tobacco Use  . Smoking status: Former Smoker     Packs/day: 0.50    Types: Cigarettes    Last attempt to quit: 01/12/2017    Years since quitting: 0.5  . Smokeless tobacco: Never Used  Substance Use Topics  . Alcohol use: Yes    Alcohol/week: 1.8 oz    Types: 3 Cans of beer per week    Comment: daily     No family history on file.   Review of Systems  Constitutional: Positive for malaise/fatigue.  HENT: Positive for tinnitus.   Eyes: Negative.   Respiratory: Negative.   Cardiovascular: Negative.   Gastrointestinal: Negative.   Genitourinary: Negative.   Musculoskeletal: Positive for joint pain.  Skin: Negative.   Neurological: Negative.   Endo/Heme/Allergies: Negative.   Psychiatric/Behavioral: Negative.     Objective:  Physical Exam  Vitals reviewed. Constitutional: He is oriented to person, place, and time. He appears well-developed and well-nourished.  HENT:  Head: Normocephalic  and atraumatic.  Eyes: Pupils are equal, round, and reactive to light. Conjunctivae and EOM are normal.  Neck: Normal range of motion. Neck supple.  Cardiovascular: Normal rate, regular rhythm and intact distal pulses.  Respiratory: Effort normal. No respiratory distress.  GI: Soft. He exhibits no distension.  Genitourinary:  Genitourinary Comments: deferred  Neurological: He is alert and oriented to person, place, and time. He has normal reflexes.  Skin: Skin is warm and dry.  Psychiatric: He has a normal mood and affect. His behavior is normal. Judgment and thought content normal.    Vital signs in last 24 hours: @VSRANGES @  Labs:   Estimated body mass index is 34.19 kg/m as calculated from the following:   Height as of 08/10/17: 6\' 3"  (1.905 m).   Weight as of 08/10/17: 124.1 kg (273 lb 8 oz).   Imaging Review Plain radiographs demonstrate severe degenerative joint disease of the left hip(s). The bone quality appears to be adequate for age and reported activity level.    Preoperative templating of the joint replacement has  been completed, documented, and submitted to the Operating Room personnel in order to optimize intra-operative equipment management.   Anticipated LOS equal to or greater than 2 midnights due to - Age 31 and older with one or more of the following:  - Obesity  - Expected need for hospital services (PT, OT, Nursing) required for safe  discharge  - Anticipated need for postoperative skilled nursing care or inpatient rehab  - Active co-morbidities: Chronic pain requiring opiods OR   - Unanticipated findings during/Post Surgery: None  - Patient is a high risk of re-admission due to: None     Assessment/Plan:  End stage arthritis, left hip(s)  The patient history, physical examination, clinical judgement of the provider and imaging studies are consistent with end stage degenerative joint disease of the left hip(s) and total hip arthroplasty is deemed medically necessary. The treatment options including medical management, injection therapy, arthroscopy and arthroplasty were discussed at length. The risks and benefits of total hip arthroplasty were presented and reviewed. The risks due to aseptic loosening, infection, stiffness, dislocation/subluxation,  thromboembolic complications and other imponderables were discussed.  The patient acknowledged the explanation, agreed to proceed with the plan and consent was signed. Patient is being admitted for inpatient treatment for surgery, pain control, PT, OT, prophylactic antibiotics, VTE prophylaxis, progressive ambulation and ADL's and discharge planning.The patient is planning to be discharged home with HEP

## 2017-08-25 MED ORDER — CEFAZOLIN SODIUM 10 G IJ SOLR
3.0000 g | INTRAMUSCULAR | Status: AC
  Administered 2017-08-26: 3 g via INTRAVENOUS
  Filled 2017-08-25: qty 3

## 2017-08-25 MED ORDER — TRANEXAMIC ACID 1000 MG/10ML IV SOLN
1000.0000 mg | INTRAVENOUS | Status: AC
  Administered 2017-08-26: 1000 mg via INTRAVENOUS
  Filled 2017-08-25: qty 1100

## 2017-08-26 ENCOUNTER — Inpatient Hospital Stay (HOSPITAL_COMMUNITY): Payer: No Typology Code available for payment source | Admitting: Certified Registered Nurse Anesthetist

## 2017-08-26 ENCOUNTER — Other Ambulatory Visit: Payer: Self-pay

## 2017-08-26 ENCOUNTER — Inpatient Hospital Stay (HOSPITAL_COMMUNITY): Payer: No Typology Code available for payment source

## 2017-08-26 ENCOUNTER — Encounter (HOSPITAL_COMMUNITY)
Admission: RE | Disposition: A | Discharge status: Home Health | Payer: Self-pay | Source: Ambulatory Visit | Attending: Orthopedic Surgery

## 2017-08-26 ENCOUNTER — Encounter (HOSPITAL_COMMUNITY): Payer: Self-pay | Admitting: *Deleted

## 2017-08-26 ENCOUNTER — Inpatient Hospital Stay (HOSPITAL_COMMUNITY)
Admission: RE | Admit: 2017-08-26 | Discharge: 2017-08-30 | DRG: 470 | Disposition: A | Discharge status: Home Health | Payer: No Typology Code available for payment source | Source: Ambulatory Visit | Attending: Orthopedic Surgery | Admitting: Orthopedic Surgery

## 2017-08-26 DIAGNOSIS — S32512A Fracture of superior rim of left pubis, initial encounter for closed fracture: Secondary | ICD-10-CM | POA: Diagnosis not present

## 2017-08-26 DIAGNOSIS — M1612 Unilateral primary osteoarthritis, left hip: Secondary | ICD-10-CM | POA: Diagnosis present

## 2017-08-26 DIAGNOSIS — F419 Anxiety disorder, unspecified: Secondary | ICD-10-CM | POA: Diagnosis present

## 2017-08-26 DIAGNOSIS — M87052 Idiopathic aseptic necrosis of left femur: Secondary | ICD-10-CM | POA: Diagnosis present

## 2017-08-26 DIAGNOSIS — Y92232 Corridor of hospital as the place of occurrence of the external cause: Secondary | ICD-10-CM | POA: Diagnosis not present

## 2017-08-26 DIAGNOSIS — Z09 Encounter for follow-up examination after completed treatment for conditions other than malignant neoplasm: Secondary | ICD-10-CM

## 2017-08-26 DIAGNOSIS — Z79891 Long term (current) use of opiate analgesic: Secondary | ICD-10-CM | POA: Diagnosis not present

## 2017-08-26 DIAGNOSIS — M879 Osteonecrosis, unspecified: Principal | ICD-10-CM | POA: Diagnosis present

## 2017-08-26 DIAGNOSIS — Z79899 Other long term (current) drug therapy: Secondary | ICD-10-CM

## 2017-08-26 DIAGNOSIS — W1830XA Fall on same level, unspecified, initial encounter: Secondary | ICD-10-CM | POA: Diagnosis not present

## 2017-08-26 DIAGNOSIS — Z96641 Presence of right artificial hip joint: Secondary | ICD-10-CM | POA: Diagnosis present

## 2017-08-26 DIAGNOSIS — Z7982 Long term (current) use of aspirin: Secondary | ICD-10-CM

## 2017-08-26 DIAGNOSIS — F329 Major depressive disorder, single episode, unspecified: Secondary | ICD-10-CM | POA: Diagnosis present

## 2017-08-26 DIAGNOSIS — Z87891 Personal history of nicotine dependence: Secondary | ICD-10-CM | POA: Diagnosis not present

## 2017-08-26 DIAGNOSIS — M25552 Pain in left hip: Secondary | ICD-10-CM

## 2017-08-26 DIAGNOSIS — W19XXXA Unspecified fall, initial encounter: Secondary | ICD-10-CM

## 2017-08-26 DIAGNOSIS — Z91030 Bee allergy status: Secondary | ICD-10-CM | POA: Diagnosis not present

## 2017-08-26 DIAGNOSIS — G4733 Obstructive sleep apnea (adult) (pediatric): Secondary | ICD-10-CM | POA: Diagnosis present

## 2017-08-26 HISTORY — PX: TOTAL HIP ARTHROPLASTY: SHX124

## 2017-08-26 LAB — TYPE AND SCREEN
ABO/RH(D): O POS
ANTIBODY SCREEN: NEGATIVE

## 2017-08-26 LAB — ABO/RH: ABO/RH(D): O POS

## 2017-08-26 IMAGING — DX DG PORTABLE PELVIS
1 series · 1 of 1 positions shown · non-contrast
Comparison: Intraoperative imaging this same day.

CLINICAL DATA: Status post left hip replacement today.

EXAM:
PORTABLE PELVIS 1-2 VIEWS

[pelvis ap]
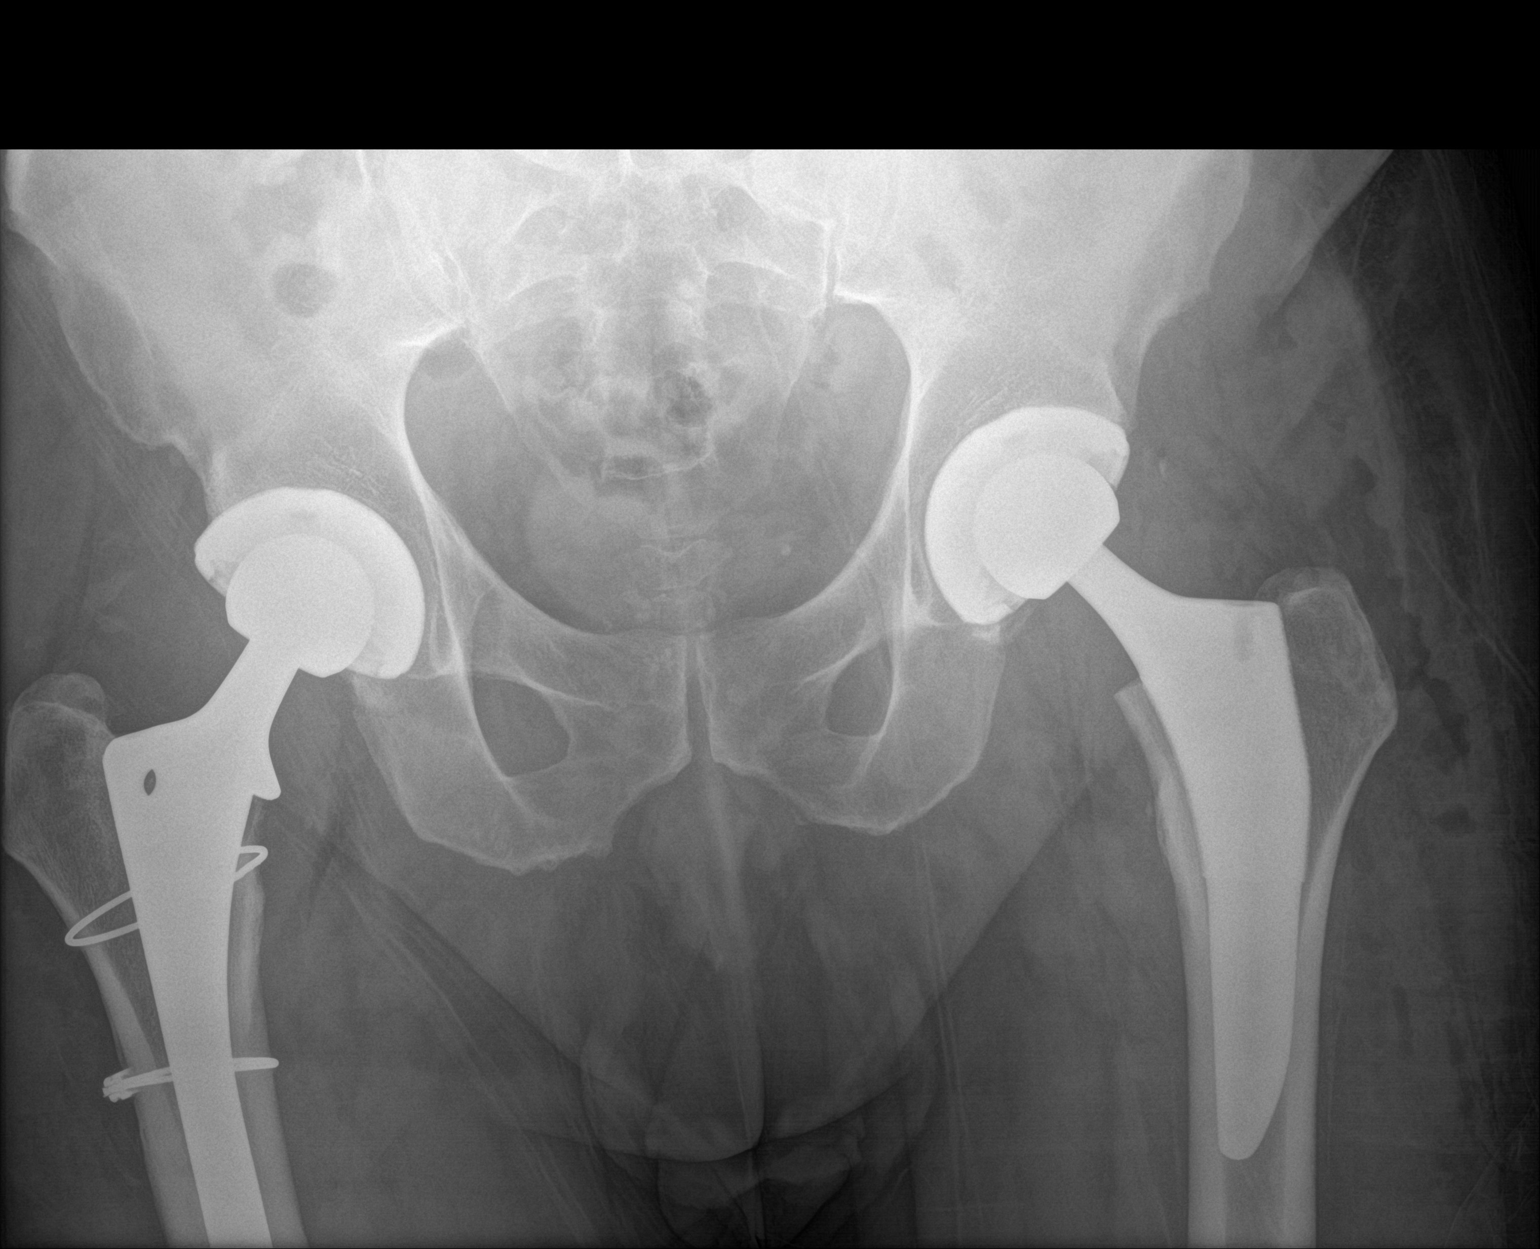

[1 of 1 positions shown; findings below may reference images not displayed]

FINDINGS: New left total hip arthroplasty is in place. The device is located.
No fracture. Gas in the soft tissues from surgery noted. Right hip
replacement also seen.
IMPRESSION: Status post left hip replacement.  No acute finding.

## 2017-08-26 SURGERY — ARTHROPLASTY, HIP, TOTAL, ANTERIOR APPROACH
Anesthesia: Spinal | Site: Hip | Laterality: Left

## 2017-08-26 MED ORDER — DEXAMETHASONE SODIUM PHOSPHATE 10 MG/ML IJ SOLN
INTRAMUSCULAR | Status: AC
  Filled 2017-08-26: qty 1

## 2017-08-26 MED ORDER — LIDOCAINE 2% (20 MG/ML) 5 ML SYRINGE
INTRAMUSCULAR | Status: AC
Start: 2017-08-26 — End: 2017-08-26
  Filled 2017-08-26: qty 5

## 2017-08-26 MED ORDER — METOCLOPRAMIDE HCL 5 MG/ML IJ SOLN
5.0000 mg | Freq: Three times a day (TID) | INTRAMUSCULAR | Status: DC | PRN
Start: 2017-08-26 — End: 2017-08-30

## 2017-08-26 MED ORDER — ACETAMINOPHEN 325 MG PO TABS
325.0000 mg | ORAL_TABLET | Freq: Four times a day (QID) | ORAL | Status: DC | PRN
  Administered 2017-08-27 – 2017-08-28 (×2): 650 mg via ORAL
  Filled 2017-08-26 (×2): qty 2

## 2017-08-26 MED ORDER — ALBUTEROL SULFATE (2.5 MG/3ML) 0.083% IN NEBU: 3.0000 mL | INHALATION_SOLUTION | Freq: Four times a day (QID) | RESPIRATORY_TRACT | Status: DC | PRN

## 2017-08-26 MED ORDER — FENTANYL CITRATE (PF) 100 MCG/2ML IJ SOLN
INTRAMUSCULAR | Status: AC
  Filled 2017-08-26: qty 2

## 2017-08-26 MED ORDER — SUCCINYLCHOLINE CHLORIDE 200 MG/10ML IV SOSY
PREFILLED_SYRINGE | INTRAVENOUS | Status: DC | PRN
  Administered 2017-08-26: 100 mg via INTRAVENOUS

## 2017-08-26 MED ORDER — KETOROLAC TROMETHAMINE 30 MG/ML IJ SOLN
INTRAMUSCULAR | Status: AC
  Filled 2017-08-26: qty 1

## 2017-08-26 MED ORDER — KETAMINE HCL 10 MG/ML IJ SOLN
INTRAMUSCULAR | Status: DC | PRN
  Administered 2017-08-26: 40 mg via INTRAVENOUS
  Administered 2017-08-26: 10 mg via INTRAVENOUS

## 2017-08-26 MED ORDER — DIPHENHYDRAMINE HCL 12.5 MG/5ML PO ELIX
12.5000 mg | ORAL_SOLUTION | ORAL | Status: DC | PRN
  Administered 2017-08-26 – 2017-08-29 (×3): 25 mg via ORAL
  Filled 2017-08-26 (×3): qty 10

## 2017-08-26 MED ORDER — PANTOPRAZOLE SODIUM 40 MG PO TBEC
40.0000 mg | DELAYED_RELEASE_TABLET | Freq: Every day | ORAL | Status: DC
  Administered 2017-08-26 – 2017-08-30 (×5): 40 mg via ORAL
  Filled 2017-08-26 (×5): qty 1

## 2017-08-26 MED ORDER — HYDROMORPHONE HCL 1 MG/ML IJ SOLN
INTRAMUSCULAR | Status: DC | PRN
  Administered 2017-08-26: .4 mg via INTRAVENOUS
  Administered 2017-08-26 (×2): .2 mg via INTRAVENOUS
  Administered 2017-08-26: .4 mg via INTRAVENOUS
  Administered 2017-08-26 (×4): .2 mg via INTRAVENOUS

## 2017-08-26 MED ORDER — WATER FOR IRRIGATION, STERILE IR SOLN
Status: DC | PRN
  Administered 2017-08-26: 2000 mL

## 2017-08-26 MED ORDER — BUSPIRONE HCL 5 MG PO TABS
15.0000 mg | ORAL_TABLET | Freq: Four times a day (QID) | ORAL | Status: DC
  Administered 2017-08-26 – 2017-08-30 (×13): 15 mg via ORAL
  Filled 2017-08-26 (×13): qty 3

## 2017-08-26 MED ORDER — ACETAMINOPHEN 10 MG/ML IV SOLN
1000.0000 mg | INTRAVENOUS | Status: AC
  Administered 2017-08-26: 1000 mg via INTRAVENOUS
  Filled 2017-08-26: qty 100

## 2017-08-26 MED ORDER — HYDROMORPHONE HCL 1 MG/ML IJ SOLN
0.5000 mg | INTRAMUSCULAR | Status: DC | PRN
  Administered 2017-08-26: 1 mg via INTRAVENOUS
  Filled 2017-08-26: qty 1

## 2017-08-26 MED ORDER — METOCLOPRAMIDE HCL 5 MG PO TABS: 5.0000 mg | ORAL_TABLET | Freq: Three times a day (TID) | ORAL | Status: DC | PRN

## 2017-08-26 MED ORDER — FENTANYL CITRATE (PF) 100 MCG/2ML IJ SOLN
INTRAMUSCULAR | Status: DC | PRN
  Administered 2017-08-26: 100 ug via INTRAVENOUS
  Administered 2017-08-26 (×2): 50 ug via INTRAVENOUS

## 2017-08-26 MED ORDER — SIMVASTATIN 20 MG PO TABS
20.0000 mg | ORAL_TABLET | Freq: Every day | ORAL | Status: DC
  Administered 2017-08-26 – 2017-08-29 (×4): 20 mg via ORAL
  Filled 2017-08-26 (×4): qty 1

## 2017-08-26 MED ORDER — TAMSULOSIN HCL 0.4 MG PO CAPS
0.4000 mg | ORAL_CAPSULE | Freq: Every day | ORAL | Status: DC
  Administered 2017-08-26 – 2017-08-29 (×4): 0.4 mg via ORAL
  Filled 2017-08-26 (×4): qty 1

## 2017-08-26 MED ORDER — ISOPROPYL ALCOHOL 70 % SOLN
Status: DC | PRN
  Administered 2017-08-26: 1 via TOPICAL

## 2017-08-26 MED ORDER — PROMETHAZINE HCL 25 MG/ML IJ SOLN: 6.2500 mg | INTRAMUSCULAR | Status: DC | PRN

## 2017-08-26 MED ORDER — MIDAZOLAM HCL 2 MG/2ML IJ SOLN
INTRAMUSCULAR | Status: AC
  Filled 2017-08-26: qty 2

## 2017-08-26 MED ORDER — ONDANSETRON HCL 4 MG/2ML IJ SOLN
INTRAMUSCULAR | Status: DC | PRN
  Administered 2017-08-26: 4 mg via INTRAVENOUS

## 2017-08-26 MED ORDER — FLUOXETINE HCL 20 MG PO CAPS
20.0000 mg | ORAL_CAPSULE | Freq: Three times a day (TID) | ORAL | Status: DC
  Administered 2017-08-26 – 2017-08-30 (×11): 20 mg via ORAL
  Filled 2017-08-26 (×11): qty 1

## 2017-08-26 MED ORDER — POLYETHYLENE GLYCOL 3350 17 G PO PACK: 17.0000 g | PACK | Freq: Every day | ORAL | Status: DC | PRN

## 2017-08-26 MED ORDER — HYDROMORPHONE HCL 1 MG/ML IJ SOLN
INTRAMUSCULAR | Status: AC
  Filled 2017-08-26: qty 1

## 2017-08-26 MED ORDER — GABAPENTIN 300 MG PO CAPS
600.0000 mg | ORAL_CAPSULE | Freq: Three times a day (TID) | ORAL | Status: DC
  Administered 2017-08-26 – 2017-08-30 (×12): 600 mg via ORAL
  Filled 2017-08-26 (×12): qty 2

## 2017-08-26 MED ORDER — KETOROLAC TROMETHAMINE 30 MG/ML IJ SOLN
INTRAMUSCULAR | Status: DC | PRN
  Administered 2017-08-26: 30 mg

## 2017-08-26 MED ORDER — CHLORHEXIDINE GLUCONATE 4 % EX LIQD: 60.0000 mL | Freq: Once | CUTANEOUS | Status: DC

## 2017-08-26 MED ORDER — LACTATED RINGERS IV SOLN
INTRAVENOUS | Status: DC
  Administered 2017-08-26 (×2): via INTRAVENOUS

## 2017-08-26 MED ORDER — METHOCARBAMOL 500 MG PO TABS
500.0000 mg | ORAL_TABLET | Freq: Four times a day (QID) | ORAL | Status: DC | PRN
  Administered 2017-08-26 – 2017-08-30 (×9): 500 mg via ORAL
  Filled 2017-08-26 (×9): qty 1

## 2017-08-26 MED ORDER — PROPOFOL 10 MG/ML IV BOLUS
INTRAVENOUS | Status: DC | PRN
  Administered 2017-08-26: 300 mg via INTRAVENOUS

## 2017-08-26 MED ORDER — METHOCARBAMOL 1000 MG/10ML IJ SOLN
500.0000 mg | Freq: Four times a day (QID) | INTRAVENOUS | Status: DC | PRN
  Administered 2017-08-26: 500 mg via INTRAVENOUS
  Filled 2017-08-26: qty 550

## 2017-08-26 MED ORDER — SUGAMMADEX SODIUM 500 MG/5ML IV SOLN
INTRAVENOUS | Status: AC
  Filled 2017-08-26: qty 5

## 2017-08-26 MED ORDER — BUPIVACAINE HCL (PF) 0.25 % IJ SOLN
INTRAMUSCULAR | Status: DC | PRN
  Administered 2017-08-26: 30 mL

## 2017-08-26 MED ORDER — SUGAMMADEX SODIUM 200 MG/2ML IV SOLN
INTRAVENOUS | Status: DC | PRN
  Administered 2017-08-26: 300 mg via INTRAVENOUS

## 2017-08-26 MED ORDER — SODIUM CHLORIDE 0.9 % IJ SOLN
INTRAMUSCULAR | Status: AC
  Filled 2017-08-26: qty 50

## 2017-08-26 MED ORDER — MIDAZOLAM HCL 2 MG/2ML IJ SOLN
INTRAMUSCULAR | Status: DC | PRN
  Administered 2017-08-26: 2 mg via INTRAVENOUS

## 2017-08-26 MED ORDER — SODIUM CHLORIDE 0.9 % IV SOLN: INTRAVENOUS | Status: DC

## 2017-08-26 MED ORDER — SUCCINYLCHOLINE CHLORIDE 200 MG/10ML IV SOSY
PREFILLED_SYRINGE | INTRAVENOUS | Status: AC
  Filled 2017-08-26: qty 10

## 2017-08-26 MED ORDER — CEFAZOLIN SODIUM-DEXTROSE 2-4 GM/100ML-% IV SOLN
2.0000 g | Freq: Four times a day (QID) | INTRAVENOUS | Status: AC
  Administered 2017-08-26 (×2): 2 g via INTRAVENOUS
  Filled 2017-08-26 (×2): qty 100

## 2017-08-26 MED ORDER — OXYCODONE HCL 5 MG PO TABS
10.0000 mg | ORAL_TABLET | ORAL | Status: DC | PRN
  Administered 2017-08-26 – 2017-08-28 (×8): 15 mg via ORAL
  Administered 2017-08-29: 10 mg via ORAL
  Filled 2017-08-26 (×4): qty 3
  Filled 2017-08-26: qty 2
  Filled 2017-08-26 (×4): qty 3

## 2017-08-26 MED ORDER — OXYCODONE HCL 5 MG PO TABS
5.0000 mg | ORAL_TABLET | ORAL | Status: DC | PRN
  Administered 2017-08-28 – 2017-08-29 (×5): 10 mg via ORAL
  Administered 2017-08-29: 5 mg via ORAL
  Administered 2017-08-30: 15 mg via ORAL
  Filled 2017-08-26 (×3): qty 2
  Filled 2017-08-26: qty 1
  Filled 2017-08-26: qty 2
  Filled 2017-08-26: qty 1
  Filled 2017-08-26 (×2): qty 2

## 2017-08-26 MED ORDER — PHENOL 1.4 % MT LIQD: 1.0000 | OROMUCOSAL | Status: DC | PRN

## 2017-08-26 MED ORDER — ALUM & MAG HYDROXIDE-SIMETH 200-200-20 MG/5ML PO SUSP: 30.0000 mL | ORAL | Status: DC | PRN

## 2017-08-26 MED ORDER — PROPOFOL 10 MG/ML IV BOLUS
INTRAVENOUS | Status: AC
  Filled 2017-08-26: qty 60

## 2017-08-26 MED ORDER — 0.9 % SODIUM CHLORIDE (POUR BTL) OPTIME
TOPICAL | Status: DC | PRN
  Administered 2017-08-26: 1000 mL

## 2017-08-26 MED ORDER — POVIDONE-IODINE 10 % EX SWAB: 2.0000 "application " | Freq: Once | CUTANEOUS | Status: DC

## 2017-08-26 MED ORDER — DOCUSATE SODIUM 100 MG PO CAPS
100.0000 mg | ORAL_CAPSULE | Freq: Two times a day (BID) | ORAL | Status: DC
  Administered 2017-08-26 – 2017-08-30 (×8): 100 mg via ORAL
  Filled 2017-08-26 (×8): qty 1

## 2017-08-26 MED ORDER — APIXABAN 2.5 MG PO TABS
2.5000 mg | ORAL_TABLET | Freq: Two times a day (BID) | ORAL | Status: DC
  Administered 2017-08-27 – 2017-08-30 (×7): 2.5 mg via ORAL
  Filled 2017-08-26 (×7): qty 1

## 2017-08-26 MED ORDER — ONDANSETRON HCL 4 MG PO TABS: 4.0000 mg | ORAL_TABLET | Freq: Four times a day (QID) | ORAL | Status: DC | PRN

## 2017-08-26 MED ORDER — ONDANSETRON HCL 4 MG/2ML IJ SOLN: 4.0000 mg | Freq: Four times a day (QID) | INTRAMUSCULAR | Status: DC | PRN

## 2017-08-26 MED ORDER — ROCURONIUM BROMIDE 10 MG/ML (PF) SYRINGE
PREFILLED_SYRINGE | INTRAVENOUS | Status: AC
  Filled 2017-08-26: qty 5

## 2017-08-26 MED ORDER — DEXAMETHASONE SODIUM PHOSPHATE 10 MG/ML IJ SOLN
INTRAMUSCULAR | Status: DC | PRN
  Administered 2017-08-26: 10 mg via INTRAVENOUS

## 2017-08-26 MED ORDER — SODIUM CHLORIDE 0.9 % IV SOLN
INTRAVENOUS | Status: DC
  Administered 2017-08-26 – 2017-08-28 (×5): via INTRAVENOUS

## 2017-08-26 MED ORDER — BUPIVACAINE HCL (PF) 0.25 % IJ SOLN
INTRAMUSCULAR | Status: AC
  Filled 2017-08-26: qty 30

## 2017-08-26 MED ORDER — HYDROMORPHONE HCL 2 MG/ML IJ SOLN
INTRAMUSCULAR | Status: AC
  Filled 2017-08-26: qty 1

## 2017-08-26 MED ORDER — DEXAMETHASONE SODIUM PHOSPHATE 10 MG/ML IJ SOLN
10.0000 mg | Freq: Once | INTRAMUSCULAR | Status: AC
  Administered 2017-08-27: 10 mg via INTRAVENOUS
  Filled 2017-08-26: qty 1

## 2017-08-26 MED ORDER — LABETALOL HCL 5 MG/ML IV SOLN
INTRAVENOUS | Status: DC | PRN
  Administered 2017-08-26: 5 mg via INTRAVENOUS

## 2017-08-26 MED ORDER — SODIUM CHLORIDE 0.9 % IJ SOLN
INTRAMUSCULAR | Status: DC | PRN
  Administered 2017-08-26: 30 mL

## 2017-08-26 MED ORDER — MENTHOL 3 MG MT LOZG: 1.0000 | LOZENGE | OROMUCOSAL | Status: DC | PRN

## 2017-08-26 MED ORDER — LABETALOL HCL 5 MG/ML IV SOLN
INTRAVENOUS | Status: AC
  Filled 2017-08-26: qty 4

## 2017-08-26 MED ORDER — ROCURONIUM BROMIDE 10 MG/ML (PF) SYRINGE
PREFILLED_SYRINGE | INTRAVENOUS | Status: DC | PRN
  Administered 2017-08-26: 10 mg via INTRAVENOUS
  Administered 2017-08-26: 30 mg via INTRAVENOUS
  Administered 2017-08-26: 20 mg via INTRAVENOUS

## 2017-08-26 MED ORDER — LIDOCAINE 2% (20 MG/ML) 5 ML SYRINGE
INTRAMUSCULAR | Status: DC | PRN
  Administered 2017-08-26: 100 mg via INTRAVENOUS

## 2017-08-26 MED ORDER — KETAMINE HCL 10 MG/ML IJ SOLN
INTRAMUSCULAR | Status: AC
  Filled 2017-08-26: qty 1

## 2017-08-26 MED ORDER — ONDANSETRON HCL 4 MG/2ML IJ SOLN
INTRAMUSCULAR | Status: AC
  Filled 2017-08-26: qty 2

## 2017-08-26 MED ORDER — HYDROMORPHONE HCL 1 MG/ML IJ SOLN
0.2500 mg | INTRAMUSCULAR | Status: DC | PRN
  Administered 2017-08-26 (×4): 0.5 mg via INTRAVENOUS

## 2017-08-26 MED ORDER — KETOROLAC TROMETHAMINE 15 MG/ML IJ SOLN
15.0000 mg | Freq: Four times a day (QID) | INTRAMUSCULAR | Status: AC
  Administered 2017-08-26 – 2017-08-27 (×4): 15 mg via INTRAVENOUS
  Filled 2017-08-26 (×4): qty 1

## 2017-08-26 SURGICAL SUPPLY — 45 items
BAG DECANTER FOR FLEXI CONT (MISCELLANEOUS) IMPLANT
BAG ZIPLOCK 12X15 (MISCELLANEOUS) IMPLANT
CAPT HIP TOTAL 2 ×3 IMPLANT
CHLORAPREP W/TINT 26ML (MISCELLANEOUS) ×3 IMPLANT
CLOTH BEACON ORANGE TIMEOUT ST (SAFETY) IMPLANT
COVER PERINEAL POST (MISCELLANEOUS) ×3 IMPLANT
COVER SURGICAL LIGHT HANDLE (MISCELLANEOUS) ×3 IMPLANT
DECANTER SPIKE VIAL GLASS SM (MISCELLANEOUS) ×3 IMPLANT
DERMABOND ADVANCED (GAUZE/BANDAGES/DRESSINGS) ×4
DERMABOND ADVANCED .7 DNX12 (GAUZE/BANDAGES/DRESSINGS) ×2 IMPLANT
DRAPE SHEET LG 3/4 BI-LAMINATE (DRAPES) ×9 IMPLANT
DRAPE STERI IOBAN 125X83 (DRAPES) ×3 IMPLANT
DRAPE U-SHAPE 47X51 STRL (DRAPES) ×6 IMPLANT
DRSG AQUACEL AG ADV 3.5X10 (GAUZE/BANDAGES/DRESSINGS) ×3 IMPLANT
ELECT PENCIL ROCKER SW 15FT (MISCELLANEOUS) ×3 IMPLANT
ELECT REM PT RETURN 15FT ADLT (MISCELLANEOUS) ×3 IMPLANT
GAUZE SPONGE 4X4 12PLY STRL (GAUZE/BANDAGES/DRESSINGS) ×3 IMPLANT
GLOVE BIO SURGEON STRL SZ8.5 (GLOVE) ×9 IMPLANT
GLOVE BIOGEL PI IND STRL 7.0 (GLOVE) ×1 IMPLANT
GLOVE BIOGEL PI IND STRL 8.5 (GLOVE) ×1 IMPLANT
GLOVE BIOGEL PI INDICATOR 7.0 (GLOVE) ×2
GLOVE BIOGEL PI INDICATOR 8.5 (GLOVE) ×2
GLOVE SURG SS PI 6.5 STRL IVOR (GLOVE) ×3 IMPLANT
GOWN SPEC L3 XXLG W/TWL (GOWN DISPOSABLE) ×3 IMPLANT
HANDPIECE INTERPULSE COAX TIP (DISPOSABLE) ×2
HOLDER FOLEY CATH W/STRAP (MISCELLANEOUS) ×3 IMPLANT
HOOD PEEL AWAY FLYTE STAYCOOL (MISCELLANEOUS) ×9 IMPLANT
MARKER SKIN DUAL TIP RULER LAB (MISCELLANEOUS) ×3 IMPLANT
NEEDLE SPNL 18GX3.5 QUINCKE PK (NEEDLE) ×3 IMPLANT
PACK ANTERIOR HIP CUSTOM (KITS) ×3 IMPLANT
SAW OSC TIP CART 19.5X105X1.3 (SAW) ×3 IMPLANT
SEALER BIPOLAR AQUA 6.0 (INSTRUMENTS) ×3 IMPLANT
SET HNDPC FAN SPRY TIP SCT (DISPOSABLE) ×1 IMPLANT
SUT ETHIBOND NAB CT1 #1 30IN (SUTURE) ×6 IMPLANT
SUT MNCRL AB 3-0 PS2 18 (SUTURE) ×3 IMPLANT
SUT MON AB 2-0 CT1 36 (SUTURE) ×6 IMPLANT
SUT STRATAFIX PDO 1 14 VIOLET (SUTURE) ×2
SUT STRATFX PDO 1 14 VIOLET (SUTURE) ×1
SUT VIC AB 2-0 CT1 27 (SUTURE) ×2
SUT VIC AB 2-0 CT1 TAPERPNT 27 (SUTURE) ×1 IMPLANT
SUTURE STRATFX PDO 1 14 VIOLET (SUTURE) ×1 IMPLANT
SYR 50ML LL SCALE MARK (SYRINGE) IMPLANT
TRAY FOLEY W/METER SILVER 16FR (SET/KITS/TRAYS/PACK) ×3 IMPLANT
WATER STERILE IRR 1000ML POUR (IV SOLUTION) ×6 IMPLANT
YANKAUER SUCT BULB TIP 10FT TU (MISCELLANEOUS) ×3 IMPLANT

## 2017-08-26 NOTE — Anesthesia Preprocedure Evaluation (Addendum)
Anesthesia Evaluation  Patient identified by MRN, date of birth, ID band Patient awake    Reviewed: Allergy & Precautions, NPO status , Patient's Chart, lab work & pertinent test results  Airway Mallampati: II  TM Distance: >3 FB Neck ROM: Full    Dental  (+) Edentulous Upper, Edentulous Lower   Pulmonary sleep apnea , former smoker,    Pulmonary exam normal breath sounds clear to auscultation       Cardiovascular negative cardio ROS Normal cardiovascular exam Rhythm:Regular Rate:Normal     Neuro/Psych negative neurological ROS  negative psych ROS   GI/Hepatic negative GI ROS, Neg liver ROS,   Endo/Other  negative endocrine ROS  Renal/GU Renal InsufficiencyRenal disease  negative genitourinary   Musculoskeletal negative musculoskeletal ROS (+)   Abdominal   Peds negative pediatric ROS (+)  Hematology negative hematology ROS (+)   Anesthesia Other Findings   Reproductive/Obstetrics negative OB ROS                            Anesthesia Physical Anesthesia Plan  ASA: III  Anesthesia Plan: General   Post-op Pain Management:    Induction: Intravenous  PONV Risk Score and Plan: 2 and Ondansetron and Dexamethasone  Airway Management Planned: Oral ETT  Additional Equipment:   Intra-op Plan:   Post-operative Plan: Extubation in OR  Informed Consent: I have reviewed the patients History and Physical, chart, labs and discussed the procedure including the risks, benefits and alternatives for the proposed anesthesia with the patient or authorized representative who has indicated his/her understanding and acceptance.   Dental advisory given  Plan Discussed with: CRNA and Surgeon  Anesthesia Plan Comments:        Anesthesia Quick Evaluation

## 2017-08-26 NOTE — Transfer of Care (Signed)
Immediate Anesthesia Transfer of Care Note  Patient: Russell Steele  Procedure(s) Performed: LEFT TOTAL HIP ARTHROPLASTY ANTERIOR APPROACH (Left Hip)  Patient Location: PACU  Anesthesia Type:General  Level of Consciousness: awake, alert , oriented and patient cooperative  Airway & Oxygen Therapy: Patient Spontanous Breathing and Patient connected to face mask oxygen  Post-op Assessment: Report given to RN and Post -op Vital signs reviewed and stable  Post vital signs: Reviewed and stable  Last Vitals:  Vitals Value Taken Time  BP 157/101 08/26/2017 12:54 PM  Temp    Pulse 85 08/26/2017 12:56 PM  Resp 29 08/26/2017 12:56 PM  SpO2 100 % 08/26/2017 12:56 PM  Vitals shown include unvalidated device data.  Last Pain:  Vitals:   08/26/17 0700  TempSrc: Oral      Patients Stated Pain Goal: 4 (08/26/17 0729)  Complications: No apparent anesthesia complications

## 2017-08-26 NOTE — Plan of Care (Signed)
Reviewed plan of care, specifically safety measures and pain control. Pt attentive and verbalized understanding of all education.

## 2017-08-26 NOTE — Op Note (Signed)
OPERATIVE REPORT  SURGEON: Samson FredericBrian Marquest Gunkel, MD   ASSISTANT: Velta AddisonShareen Davenport, PA-C.  PREOPERATIVE DIAGNOSIS: Left hip avascular necrosis.   POSTOPERATIVE DIAGNOSIS: Left hip avascular necrosis.   PROCEDURE: Left total hip arthroplasty, anterior approach.   IMPLANTS: DePuy Tri Lock stem, size 10, hi offset. DePuy Pinnacle Cup, size 60 mm. DePuy Altrx liner, size 36 by 60 mm, neutral. DePuy Biolox ceramic head ball, size 36 + 5 mm.  ANESTHESIA:  General  ESTIMATED BLOOD LOSS:-400 mL    ANTIBIOTICS: 2 g Ancef.  DRAINS: None.  COMPLICATIONS: None.   CONDITION: PACU - hemodynamically stable.   BRIEF CLINICAL NOTE: Russell Steele is a 62 y.o. male with a long-standing history of Left hip avascular necrosis. After failing conservative management, the patient was indicated for total hip arthroplasty. The risks, benefits, and alternatives to the procedure were explained, and the patient elected to proceed.  PROCEDURE IN DETAIL: Surgical site was marked by myself in the pre-op holding area. Once inside the operating room, general anesthesia was obtained, and a foley catheter was inserted. The patient was then positioned on the Hana table. All bony prominences were well padded. The hip was prepped and draped in the normal sterile surgical fashion. A time-out was called verifying side and site of surgery. The patient received IV antibiotics within 60 minutes of beginning the procedure.  The direct anterior approach to the hip was performed through the Hueter interval. Lateral femoral circumflex vessels were treated with the Auqumantys. The anterior capsule was exposed and an inverted T capsulotomy was made.The femoral neck cut was made to the level of the templated cut. A corkscrew was placed into the head and the head was removed. The femoral head was found to have delaminated cartilage. The head was passed to the back table and was measured.  Acetabular exposure was achieved,  and the pulvinar and labrum were excised. Sequential reaming of the acetabulum was then performed up to a size 59 mm reamer. A 60 mm cup was then opened and impacted into place at approximately 40 degrees of abduction and 20 degrees of anteversion. The final polyethylene liner was impacted into place and acetabular osteophytes were removed.   I then gained femoral exposure taking care to protect the abductors and greater trochanter. This was performed using standard external rotation, extension, and adduction. The capsule was peeled off the inner aspect of the greater trochanter, taking care to preserve the short external rotators. A cookie cutter was used to enter the femoral canal, and then the femoral canal finder was placed. Sequential broaching was performed up to a size 10. Calcar planer was used on the femoral neck remnant. The broach was engaging distally, not proximally.  Therefore I reamed the proximal femur.  The 10 broach was then seated in a satisfactory manner.  I placed a hi offset neck and a trial head ball. The hip was reduced. Leg lengths and offset were checked fluoroscopically. The hip was dislocated and trial components were removed. The final implants were placed, and the hip was reduced.  Fluoroscopy was used to confirm component position and leg lengths. At 90 degrees of external rotation and full extension, the hip was stable to an anterior directed force.  The wound was copiously irrigated with normal saline using pulse lavage. Marcaine solution was injected into the periarticular soft tissue. The wound was closed in layers using #1 Vicryl and V-Loc for the fascia, 2-0 Vicryl for the subcutaneous fat, 2-0 Monocryl for the deep dermal layer, 3-0 running  Monocryl subcuticular stitch, and Dermabond for the skin. Once the glue was fully dried, an Aquacell Ag dressing was applied. The patient was transported to the recovery room in stable condition. Sponge, needle, and  instrument counts were correct at the end of the case x2. The patient tolerated the procedure well and there were no known complications.

## 2017-08-26 NOTE — Anesthesia Procedure Notes (Signed)
Procedure Name: Intubation Date/Time: 08/26/2017 9:44 AM Performed by: Dione Booze, CRNA Pre-anesthesia Checklist: Suction available, Patient being monitored, Emergency Drugs available and Patient identified Patient Re-evaluated:Patient Re-evaluated prior to induction Oxygen Delivery Method: Circle system utilized Preoxygenation: Pre-oxygenation with 100% oxygen Induction Type: IV induction Ventilation: Oral airway inserted - appropriate to patient size and Mask ventilation without difficulty Laryngoscope Size: Mac and 4 Grade View: Grade I Tube type: Oral Tube size: 7.5 mm Number of attempts: 1 Airway Equipment and Method: Stylet Placement Confirmation: ETT inserted through vocal cords under direct vision,  positive ETCO2 and breath sounds checked- equal and bilateral Secured at: 21 cm Tube secured with: Tape Dental Injury: Teeth and Oropharynx as per pre-operative assessment

## 2017-08-26 NOTE — Interval H&P Note (Signed)
History and Physical Interval Note:  08/26/2017 9:34 AM  Russell CampVictor Scheunemann  has presented today for surgery, with the diagnosis of Avascular necrosis left hip  The various methods of treatment have been discussed with the patient and family. After consideration of risks, benefits and other options for treatment, the patient has consented to  Procedure(s) with comments: LEFT TOTAL HIP ARTHROPLASTY ANTERIOR APPROACH (Left) - Needs RNFA as a surgical intervention .  The patient's history has been reviewed, patient examined, no change in status, stable for surgery.  I have reviewed the patient's chart and labs.  Questions were answered to the patient's satisfaction.     Iline OvenBrian J Yusef Lamp

## 2017-08-27 ENCOUNTER — Encounter (HOSPITAL_COMMUNITY): Payer: Self-pay | Admitting: Orthopedic Surgery

## 2017-08-27 ENCOUNTER — Inpatient Hospital Stay (HOSPITAL_COMMUNITY): Payer: No Typology Code available for payment source

## 2017-08-27 LAB — CBC
HCT: 33.6 % — ABNORMAL LOW (ref 39.0–52.0)
HEMOGLOBIN: 11 g/dL — AB (ref 13.0–17.0)
MCH: 32.4 pg (ref 26.0–34.0)
MCHC: 32.7 g/dL (ref 30.0–36.0)
MCV: 98.8 fL (ref 78.0–100.0)
Platelets: 224 10*3/uL (ref 150–400)
RBC: 3.4 MIL/uL — ABNORMAL LOW (ref 4.22–5.81)
RDW: 13.9 % (ref 11.5–15.5)
WBC: 11.1 10*3/uL — ABNORMAL HIGH (ref 4.0–10.5)

## 2017-08-27 LAB — BASIC METABOLIC PANEL
Anion gap: 10 (ref 5–15)
BUN: 22 mg/dL — ABNORMAL HIGH (ref 6–20)
CHLORIDE: 99 mmol/L — AB (ref 101–111)
CO2: 26 mmol/L (ref 22–32)
CREATININE: 1.51 mg/dL — AB (ref 0.61–1.24)
Calcium: 8.5 mg/dL — ABNORMAL LOW (ref 8.9–10.3)
GFR calc Af Amer: 56 mL/min — ABNORMAL LOW (ref >60)
GFR calc non Af Amer: 49 mL/min — ABNORMAL LOW (ref >60)
Glucose, Bld: 125 mg/dL — ABNORMAL HIGH (ref 65–99)
Potassium: 4.9 mmol/L (ref 3.5–5.1)
SODIUM: 135 mmol/L (ref 135–145)

## 2017-08-27 IMAGING — DX DG HIP (WITH OR WITHOUT PELVIS) 1V PORT*L*
2 series · 2 of 2 positions shown · non-contrast
Comparison: Pelvis film of 09/27/2016

CLINICAL DATA: Fell doing physical therapy, had left hip surgery
yesterday

EXAM:
DG HIP (WITH OR WITHOUT PELVIS) 1V PORT LEFT

[pelvis ap]
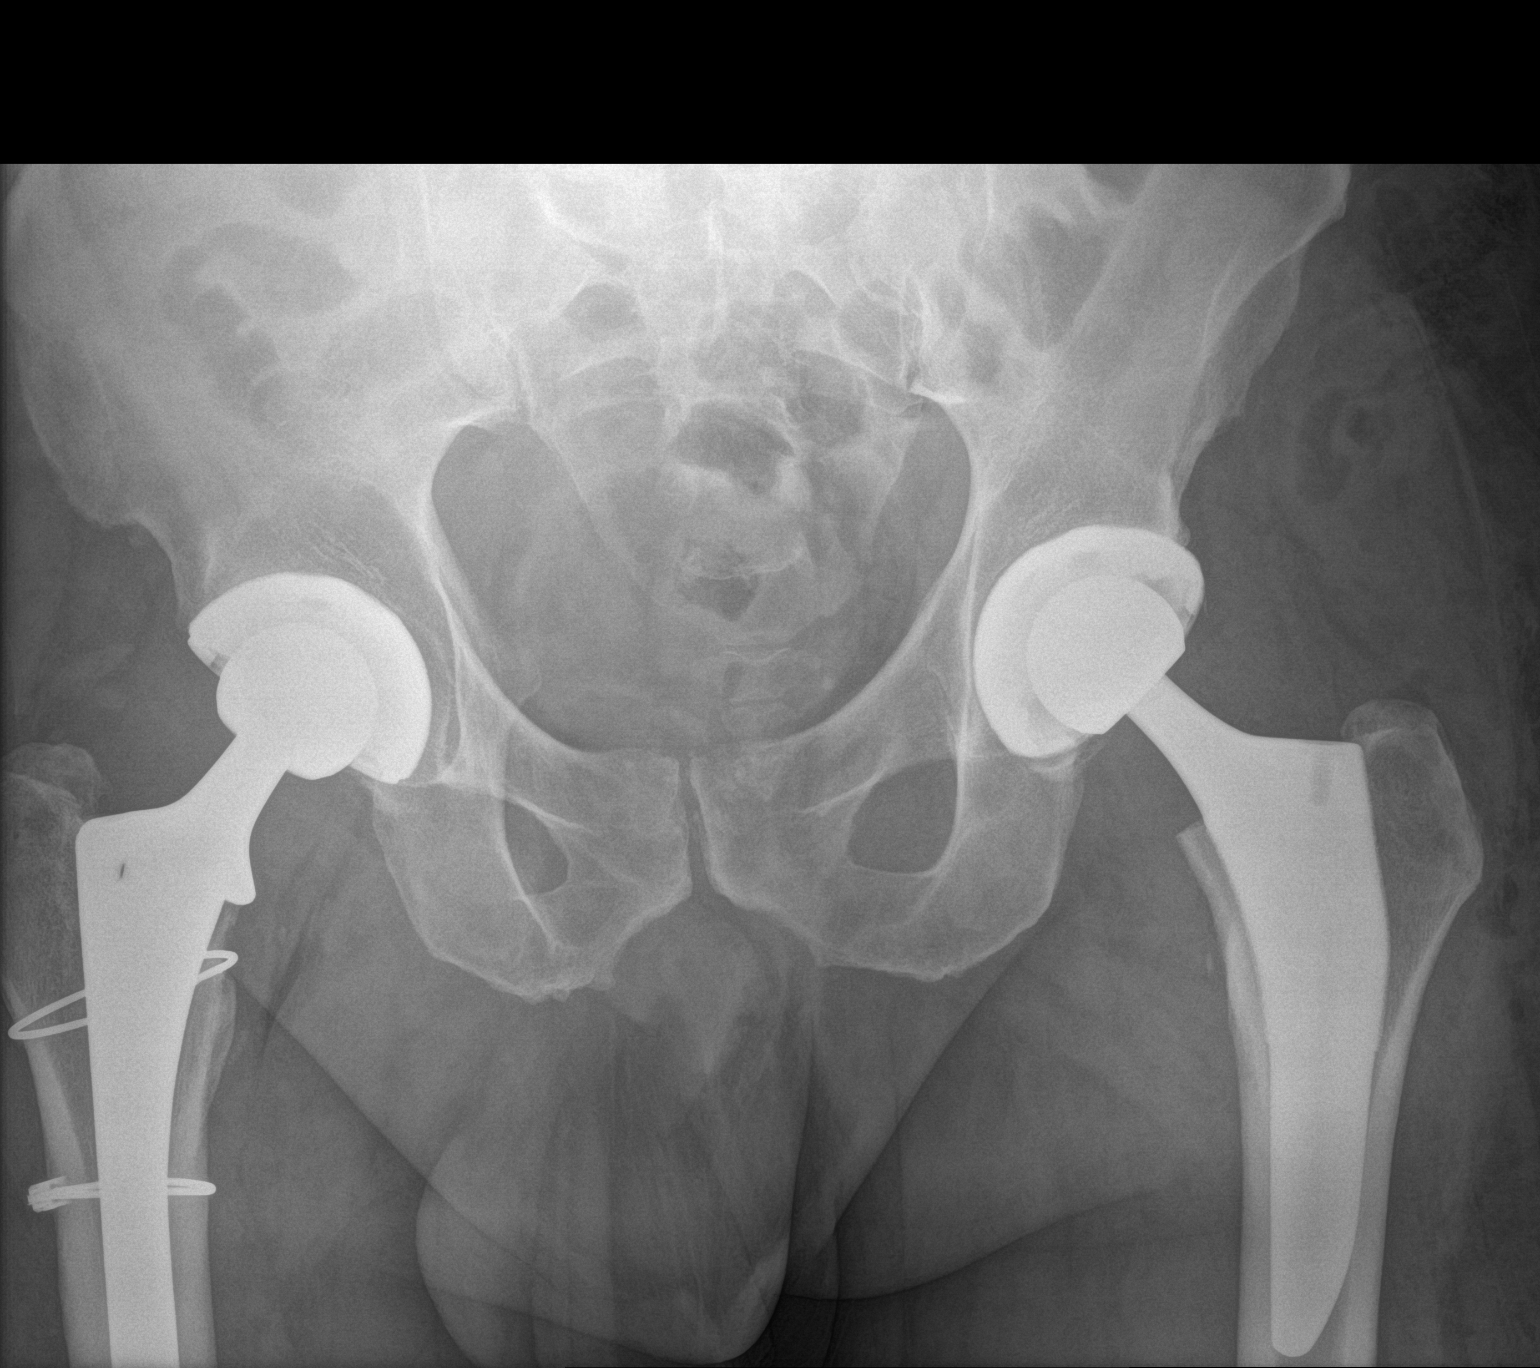

[hip frog leg]
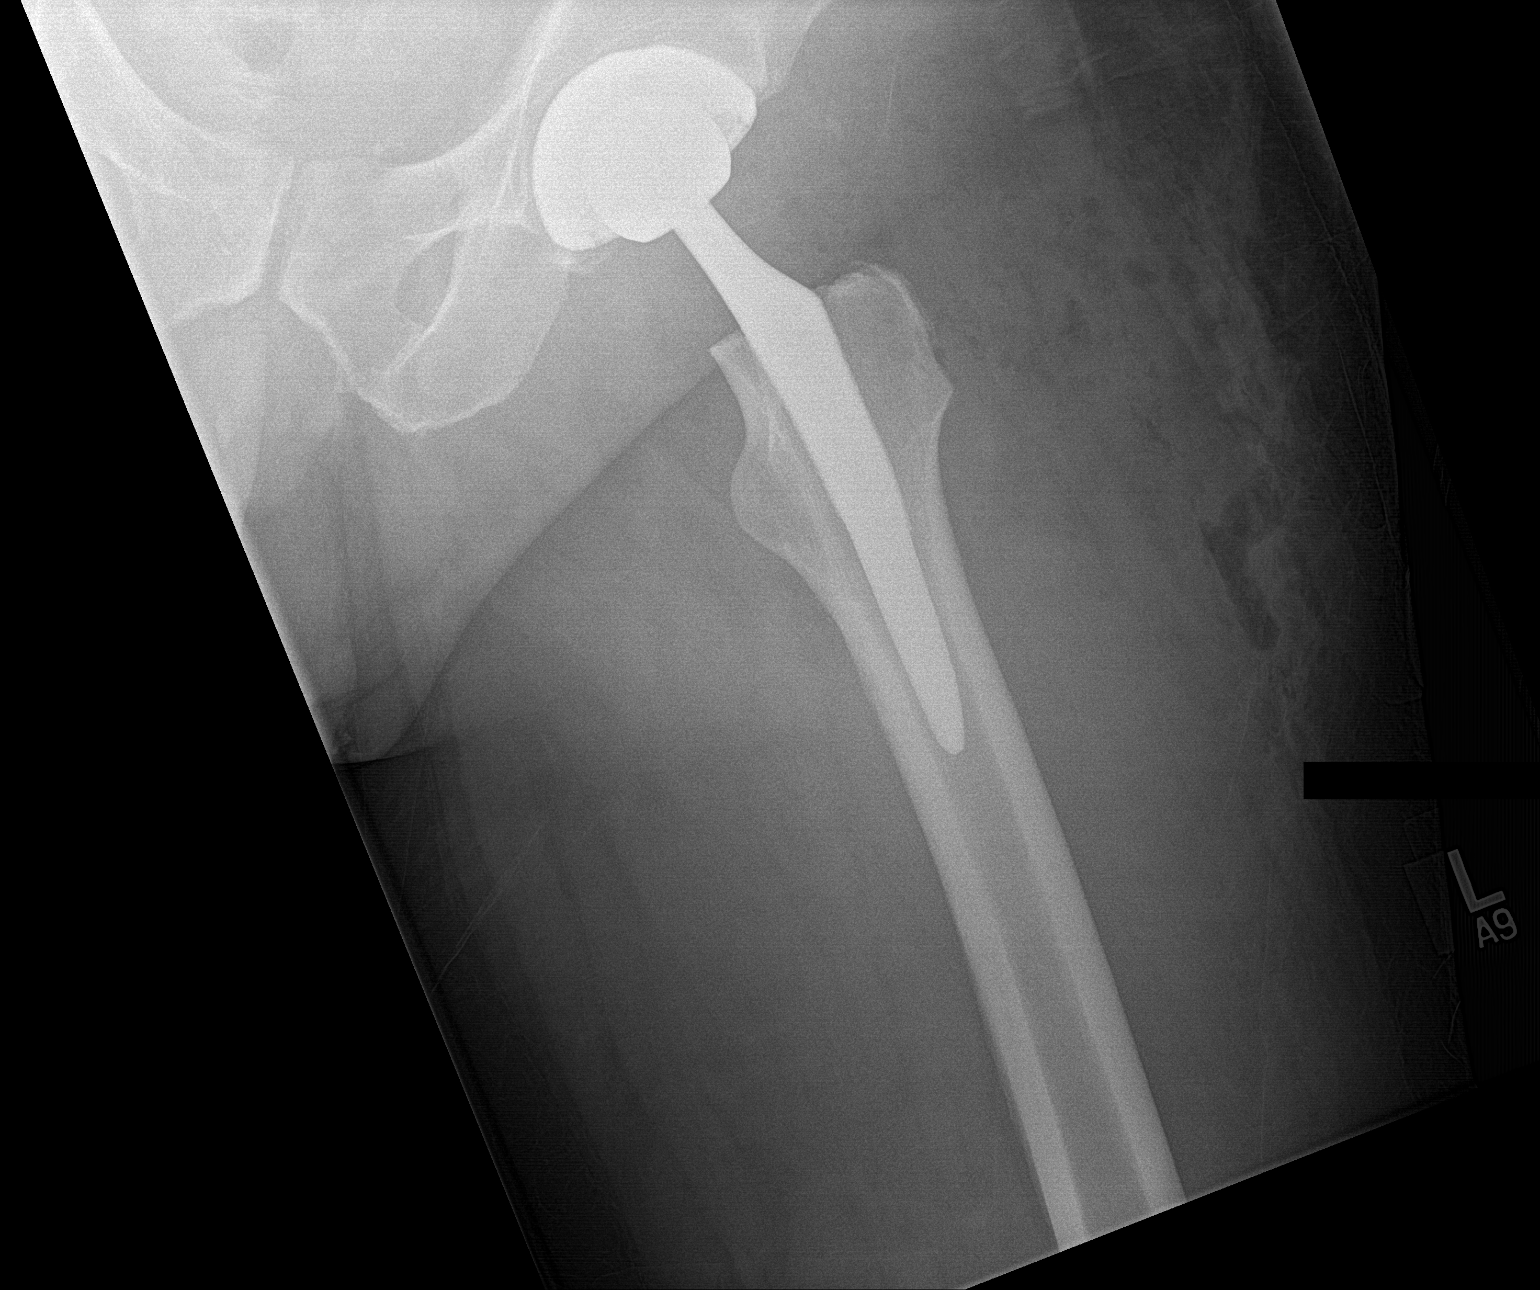

[2 of 2 positions shown; findings below may reference images not displayed]

FINDINGS: The components of the left total hip replacement appear to be in
good position. No fracture is seen. No malalignment is noted. There
is some air in the soft tissues overlying the left hip from recent
surgical intervention. The right total hip components also appear to
be in normal position. The pelvic rami are intact. No acute
abnormality is seen.
IMPRESSION: Bilateral total hip prostheses are present in good position with no
complicating features.

## 2017-08-27 MED ORDER — OXYCODONE HCL 10 MG PO TABS
10.0000 mg | ORAL_TABLET | ORAL | 0 refills | Status: AC | PRN
Start: 2017-08-27 — End: ?

## 2017-08-27 MED ORDER — APIXABAN 2.5 MG PO TABS: 2.5000 mg | ORAL_TABLET | Freq: Two times a day (BID) | ORAL | 0 refills | Status: AC

## 2017-08-27 MED ORDER — SODIUM CHLORIDE 0.9 % IV BOLUS
1000.0000 mL | Freq: Once | INTRAVENOUS | Status: AC
  Administered 2017-08-27: 1000 mL via INTRAVENOUS

## 2017-08-27 MED ORDER — ONDANSETRON HCL 4 MG PO TABS: 4.0000 mg | ORAL_TABLET | Freq: Four times a day (QID) | ORAL | 0 refills | Status: AC | PRN

## 2017-08-27 NOTE — Progress Notes (Signed)
Patient was ambulating in hall with therapy when left leg became weak and buckled. Therapy guided patient to floor onto buttocks. Assisted into chair x4 staff. VSS. MD notified. No injuries noted. Patient reports feeling shaky but pain is the same as prior to the fall. Will cont to monitor.

## 2017-08-27 NOTE — Progress Notes (Signed)
xrays were negative. Pt states he is unable to WB - will order CT L hip to r/o ppx fx.

## 2017-08-27 NOTE — Discharge Instructions (Signed)
° °Dr. Brian Swinteck °Joint Replacement Specialist °[facility] °[address] °[place],  [address] °[phone] ° ° °TOTAL HIP REPLACEMENT POSTOPERATIVE DIRECTIONS ° ° ° °Hip Rehabilitation, Guidelines Following Surgery  ° °WEIGHT BEARING °Weight bearing as tolerated with assist device (walker, cane, etc) as directed, use it as long as suggested by your surgeon or therapist, typically at least 4-6 weeks. ° °The results of a hip operation are greatly improved after range of motion and muscle strengthening exercises. Follow all safety measures which are given to protect your hip. If any of these exercises cause increased pain or swelling in your joint, decrease the amount until you are comfortable again. Then slowly increase the exercises. Call your caregiver if you have problems or questions.  ° °HOME CARE INSTRUCTIONS  °Most of the following instructions are designed to prevent the dislocation of your new hip.  °Remove items at home which could result in a fall. This includes throw rugs or furniture in walking pathways.  °Continue medications as instructed at time of discharge. °· You may have some home medications which will be placed on hold until you complete the course of blood thinner medication. °· You may start showering once you are discharged home. Do not remove your dressing. °Do not put on socks or shoes without following the instructions of your caregivers.   °Sit on chairs with arms. Use the chair arms to help push yourself up when arising.  °Arrange for the use of a toilet seat elevator so you are not sitting low.  °· Walk with walker as instructed.  °You may resume a sexual relationship in one month or when given the OK by your caregiver.  °Use walker as long as suggested by your caregivers.  °You may put full weight on your legs and walk as much as is comfortable. °Avoid periods of inactivity such as sitting longer than an hour when not asleep. This helps prevent  blood clots.  °You may return to work once you are cleared by your surgeon.  °Do not drive a car for 6 weeks or until released by your surgeon.  °Do not drive while taking narcotics.  °Wear elastic stockings for two weeks following surgery during the day but you may remove then at night.  °Make sure you keep all of your appointments after your operation with all of your doctors and caregivers. You should call the office at the above phone number and make an appointment for approximately two weeks after the date of your surgery. °Please pick up a stool softener and laxative for home use as long as you are requiring pain medications. °· ICE to the affected hip every three hours for 30 minutes at a time and then as needed for pain and swelling. Continue to use ice on the hip for pain and swelling from surgery. You may notice swelling that will progress down to the foot and ankle.  This is normal after surgery.  Elevate the leg when you are not up walking on it.   °It is important for you to complete the blood thinner medication as prescribed by your doctor. °· Continue to use the breathing machine which will help keep your temperature down.  It is common for your temperature to cycle up and down following surgery, especially at night when you are not up moving around and exerting yourself.  The breathing machine keeps your lungs expanded and your temperature down. ° °RANGE OF MOTION AND STRENGTHENING EXERCISES  °These exercises   are designed to help you keep full movement of your hip joint. Follow your caregiver's or physical therapist's instructions. Perform all exercises about fifteen times, three times per day or as directed. Exercise both hips, even if you have had only one joint replacement. These exercises can be done on a training (exercise) mat, on the floor, on a table or on a bed. Use whatever works the best and is most comfortable for you. Use music or television while you are exercising so that the exercises  are a pleasant break in your day. This will make your life better with the exercises acting as a break in routine you can look forward to.  Lying on your back, slowly slide your foot toward your buttocks, raising your knee up off the floor. Then slowly slide your foot back down until your leg is straight again.  Lying on your back spread your legs as far apart as you can without causing discomfort.  Lying on your side, raise your upper leg and foot straight up from the floor as far as is comfortable. Slowly lower the leg and repeat.  Lying on your back, tighten up the muscle in the front of your thigh (quadriceps muscles). You can do this by keeping your leg straight and trying to raise your heel off the floor. This helps strengthen the largest muscle supporting your knee.  Lying on your back, tighten up the muscles of your buttocks both with the legs straight and with the knee bent at a comfortable angle while keeping your heel on the floor.   SKILLED REHAB INSTRUCTIONS: If the patient is transferred to a skilled rehab facility following release from the hospital, a list of the current medications will be sent to the facility for the patient to continue.  When discharged from the skilled rehab facility, please have the facility set up the patient's Home Health Physical Therapy prior to being released. Also, the skilled facility will be responsible for providing the patient with their medications at time of release from the facility to include their pain medication and their blood thinner medication. If the patient is still at the rehab facility at time of the two week follow up appointment, the skilled rehab facility will also need to assist the patient in arranging follow up appointment in our office and any transportation needs.  MAKE SURE YOU:  Understand these instructions.  Will watch your condition.  Will get help right away if you are not doing well or get worse.  Pick up stool softner and  laxative for home use following surgery while on pain medications. Do not remove your dressing. The dressing is waterproof--it is OK to take showers. Continue to use ice for pain and swelling after surgery. Do not use any lotions or creams on the incision until instructed by your surgeon. Total Hip Protocol.    Information on my medicine - ELIQUIS (apixaban)  This medication education was reviewed with me or my healthcare representative as part of my discharge preparation.  The pharmacist that spoke with me during my hospital stay was:   Why was Eliquis prescribed for you? Eliquis was prescribed for you to reduce the risk of blood clots forming after orthopedic surgery.    What do You need to know about Eliquis? Take your Eliquis TWICE DAILY - one tablet in the morning and one tablet in the evening with or without food.  It would be best to take the dose about the same time each day.  If you have difficulty swallowing the tablet whole please discuss with your pharmacist how to take the medication safely.  Take Eliquis exactly as prescribed by your doctor and DO NOT stop taking Eliquis without talking to the doctor who prescribed the medication.  Stopping without other medication to take the place of Eliquis may increase your risk of developing a clot.  After discharge, you should have regular check-up appointments with your healthcare provider that is prescribing your Eliquis.  What do you do if you miss a dose? If a dose of ELIQUIS is not taken at the scheduled time, take it as soon as possible on the same day and twice-daily administration should be resumed.  The dose should not be doubled to make up for a missed dose.  Do not take more than one tablet of ELIQUIS at the same time.  Important Safety Information A possible side effect of Eliquis is bleeding. You should call your healthcare provider right away if you experience any of the following: ? Bleeding from an injury  or your nose that does not stop. ? Unusual colored urine (red or dark brown) or unusual colored stools (red or black). ? Unusual bruising for unknown reasons. ? A serious fall or if you hit your head (even if there is no bleeding).  Some medicines may interact with Eliquis and might increase your risk of bleeding or clotting while on Eliquis. To help avoid this, consult your healthcare provider or pharmacist prior to using any new prescription or non-prescription medications, including herbals, vitamins, non-steroidal anti-inflammatory drugs (NSAIDs) and supplements.  This website has more information on Eliquis (apixaban): http://www.eliquis.com/eliquis/home

## 2017-08-27 NOTE — Evaluation (Signed)
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 Assisted to sitting onto flor. Assisted into recliner. Dr. Linna CapriceSwinteck notified. Pt admitted with above diagnosis. Pt currently with functional limitations due to the deficits listed below (see PT Problem List).  Pt will benefit from skilled PT to increase their independence and safety with mobility to allow discharge to the venue listed below.  Per left hip xray, no fracture and replacement in alignment.    Follow Up Recommendations Home health PT    Equipment Recommendations  None recommended by PT    Recommendations for Other Services       Precautions / Restrictions Precautions Precautions: Fall      Mobility  Bed Mobility Overal bed mobility: Needs Assistance Bed Mobility: Supine to Sit     Supine to sit: Min assist     General bed mobility comments: assist with left leg  Transfers Overall transfer level: Needs assistance Equipment used: Rolling walker (2 wheeled) Transfers: Sit to/from Stand Sit to Stand: Min assist;+2 safety/equipment;From elevated surface         General transfer comment: cues for hand placement and left leg  Ambulation/Gait Ambulation/Gait assistance: Min assist;+2 safety/equipment Ambulation Distance (Feet): 50 Feet Assistive device: Rolling walker (2 wheeled) Gait Pattern/deviations: Step-to pattern;Step-through pattern;Decreased step length - left;Decreased stance time - left     General Gait Details: while ambulating patient's left knee buckled and patient was lowered to the floor. #3 assist to get up into recliner.  Stairs            Wheelchair Mobility    Modified Rankin (Stroke Patients Only)       Balance                                              Pertinent Vitals/Pain Pain Assessment: 0-10 Pain Score: 8  Pain Location: left thigh and hip Pain Descriptors / Indicators: Aching;Discomfort Pain Intervention(s): Monitored during session;Premedicated before session;Ice applied    Home Living Family/patient expects to be discharged to:: Private residence Living Arrangements: Spouse/significant other Available Help at Discharge: Available 24 hours/day;Family Type of Home: House Home Access: Ramped entrance     Home Layout: One level Home Equipment: Grab bars - tub/shower;Walker - standard;Wheelchair - manual;Bedside commode Additional Comments: daughter is planning to stay    Prior Function           Comments: enjoys his dogs     Hand Dominance        Extremity/Trunk Assessment   Upper Extremity Assessment Upper Extremity Assessment: Overall WFL for tasks assessed    Lower Extremity Assessment Lower Extremity Assessment: LLE deficits/detail LLE Deficits / Details: tolerated hip flexion with assistance, able to advance the leg    Cervical / Trunk Assessment Cervical / Trunk Assessment: Normal  Communication   Communication: No difficulties  Cognition Arousal/Alertness: Awake/alert Behavior During Therapy: WFL for tasks assessed/performed Overall Cognitive Status: Within Functional Limits for tasks assessed                                        General  Comments      Exercises Total Joint Exercises Ankle Circles/Pumps: AROM;Both;10 reps Quad Sets: AROM;Both Short Arc Quad: AROM;Left;10 reps Heel Slides: AAROM;Left;10 reps Hip ABduction/ADduction: AAROM;Left;10 reps   Assessment/Plan    PT Assessment Patient needs continued PT services  PT Problem List Decreased strength;Decreased range of motion;Decreased knowledge of use of DME;Decreased activity tolerance;Decreased safety awareness;Decreased knowledge of precautions;Decreased  mobility;Pain       PT Treatment Interventions DME instruction;Therapeutic exercise;Gait training;Functional mobility training;Therapeutic activities;Patient/family education    PT Goals (Current goals can be found in the Care Plan section)  Acute Rehab PT Goals Patient Stated Goal: to go home PT Goal Formulation: With patient Time For Goal Achievement: 09/03/17 Potential to Achieve Goals: Good    Frequency 7X/week   Barriers to discharge        Co-evaluation               AM-PAC PT "6 Clicks" Daily Activity  Outcome Measure Difficulty turning over in bed (including adjusting bedclothes, sheets and blankets)?: Unable Difficulty moving from lying on back to sitting on the side of the bed? : Unable Difficulty sitting down on and standing up from a chair with arms (e.g., wheelchair, bedside commode, etc,.)?: Unable Help needed moving to and from a bed to chair (including a wheelchair)?: Total Help needed walking in hospital room?: Total Help needed climbing 3-5 steps with a railing? : Total 6 Click Score: 6    End of Session Equipment Utilized During Treatment: Gait belt Activity Tolerance: Patient tolerated treatment well Patient left: in chair;with call bell/phone within reach Nurse Communication: Mobility status PT Visit Diagnosis: Unsteadiness on feet (R26.81);History of falling (Z91.81);Pain Pain - Right/Left: Left Pain - part of body: Hip    Time: 7829-5621 PT Time Calculation (min) (ACUTE ONLY): 29 min   Charges:   PT Evaluation $PT Eval Moderate Complexity: 1 Mod PT Treatments $Gait Training: 8-22 mins   PT G CodesBlanchard Kelch PT [phone]   Rada Hay 08/27/2017, 1:52 PM

## 2017-08-27 NOTE — Progress Notes (Signed)
    Subjective:  Patient reports pain as mild to moderate.  Denies N/V/CP/SOB. Had assisted fall to ground on buttocks while working with PT. Patient states he cannot bear weight since this incident.  Objective:   VITALS:   Vitals:   08/26/17 2119 08/27/17 0131 08/27/17 0550 08/27/17 0950  BP: 136/87 121/72 (!) 155/76 (!) 160/77  Pulse: 82 71 85 (!) 109  Resp:    19  Temp: 98.4 F (36.9 C) 98.1 F (36.7 C) 98.2 F (36.8 C)   TempSrc: Oral Oral Oral   SpO2: 97% 99% 98% 99%  Weight:      Height:        NAD Neurovascular intact Intact pulses distally Dorsiflexion/Plantar flexion intact Incision: dressing C/D/I Compartment soft   Lab Results  Component Value Date   WBC 11.1 (H) 08/27/2017   HGB 11.0 (L) 08/27/2017   HCT 33.6 (L) 08/27/2017   MCV 98.8 08/27/2017   PLT 224 08/27/2017   BMET    Component Value Date/Time   NA 135 08/27/2017 0532   K 4.9 08/27/2017 0532   CL 99 (L) 08/27/2017 0532   CO2 26 08/27/2017 0532   GLUCOSE 125 (H) 08/27/2017 0532   BUN 22 (H) 08/27/2017 0532   CREATININE 1.51 (H) 08/27/2017 0532   CALCIUM 8.5 (L) 08/27/2017 0532   GFRNONAA 49 (L) 08/27/2017 0532   GFRAA 56 (L) 08/27/2017 0532     Assessment/Plan: 1 Day Post-Op   Principal Problem:   Avascular necrosis of hip, left (HCC)   WBAT with walker DVT ppx: apixaban, SCDs, TEDS PO pain control PT/OT Dispo: L hip xrays ordered, d/c home tomorrow with HEP if xrays ok   Iline OvenBrian J Zeynep Fantroy 08/27/2017, 10:07 AM   Samson FredericBrian Arcelia Pals, MD Cell [phone]

## 2017-08-27 NOTE — Progress Notes (Addendum)
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 Continue PT as tolerated after cleared by MD.   Follow Up Recommendations  Home health PT     Equipment Recommendations  None recommended by PT    Recommendations for Other Services       Precautions / Restrictions Precautions Precautions: Fall    Mobility  Bed Mobility Overal bed mobility: Needs Assistance Bed Mobility: Sit to Supine     Supine to sit: Min assist     General bed mobility comments: assist with left leg  Transfers Overall transfer level: Needs assistance Equipment used: Rolling walker (2 wheeled) Transfers: Lateral/Scoot Transfers Sit to Stand: Min assist;+2 safety/equipment;From elevated surface         General transfer comment: attwempted to stand from recliner but unable to safely stand. assisted with laters scoot from recliner to the bed with 3 assist. Xray arrived .  Ambulation/Gait   Stairs             Wheelchair Mobility    Modified Rankin (Stroke Patients Only)       Balance                                            Cognition Arousal/Alertness: Awake/alert Behavior During Therapy: Anxious Overall Cognitive Status: Within Functional Limits for tasks assessed                                        Exercises   General Comments        Pertinent Vitals/Pain Pain Assessment: 0-10 Pain Score: 10-Worst pain ever Pain Location: left thigh and hip Pain Descriptors / Indicators: Aching;Discomfort;Grimacing;Guarding Pain Intervention(s): Monitored during session;Premedicated before session    Home Living Family/patient expects to be discharged to:: Private residence Living Arrangements:  Spouse/significant other Available Help at Discharge: Available 24 hours/day;Family Type of Home: House Home Access: Ramped entrance   Home Layout: One level Home Equipment: Grab bars - tub/shower;Walker - standard;Wheelchair - manual;Bedside commode Additional Comments: daughter is planning to stay    Prior Function        Comments: enjoys his dogs   PT Goals (current goals can now be found in the care plan section) Acute Rehab PT Goals Patient Stated Goal: to go home PT Goal Formulation: With patient Time For Goal Achievement: 09/03/17 Potential to Achieve Goals: Good Progress towards PT goals: Progressing toward goals    Frequency    7X/week      PT Plan Current plan remains appropriate    Co-evaluation              AM-PAC PT "6 Clicks" Daily Activity  Outcome Measure  Difficulty turning over in bed (including adjusting bedclothes, sheets and blankets)?: Unable Difficulty moving from lying on back to sitting on the side of the bed? : Unable Difficulty sitting down on and standing up from a chair with arms (e.g., wheelchair, bedside commode, etc,.)?: Unable Help needed moving to and from a bed to chair (including a wheelchair)?: Total Help needed walking in hospital room?:  Total Help needed climbing 3-5 steps with a railing? : Total 6 Click Score: 6    End of Session Equipment Utilized During Treatment: Gait belt Activity Tolerance: Patient limited by pain Patient left: in bed;with nursing/sitter in room Nurse Communication: Mobility status PT Visit Diagnosis: Unsteadiness on feet (R26.81);History of falling (Z91.81);Pain Pain - Right/Left: Left Pain - part of body: Hip     Time: 1110-1130 PT Time Calculation (min) (ACUTE ONLY): 20 min  Charges: $Therapeutic Activity: 8-22 mins                    G CodesBlanchard Kelch PT [phone]    Rada Hay 08/27/2017, 3:44 PM

## 2017-08-28 ENCOUNTER — Inpatient Hospital Stay (HOSPITAL_COMMUNITY): Payer: No Typology Code available for payment source

## 2017-08-28 LAB — CBC
HCT: 30.4 % — ABNORMAL LOW (ref 39.0–52.0)
HEMOGLOBIN: 9.9 g/dL — AB (ref 13.0–17.0)
MCH: 32.8 pg (ref 26.0–34.0)
MCHC: 32.6 g/dL (ref 30.0–36.0)
MCV: 100.7 fL — ABNORMAL HIGH (ref 78.0–100.0)
Platelets: 186 10*3/uL (ref 150–400)
RBC: 3.02 MIL/uL — AB (ref 4.22–5.81)
RDW: 14.1 % (ref 11.5–15.5)
WBC: 10.8 10*3/uL — ABNORMAL HIGH (ref 4.0–10.5)

## 2017-08-28 LAB — BASIC METABOLIC PANEL
Anion gap: 9 (ref 5–15)
BUN: 20 mg/dL (ref 6–20)
CHLORIDE: 106 mmol/L (ref 101–111)
CO2: 26 mmol/L (ref 22–32)
Calcium: 8.5 mg/dL — ABNORMAL LOW (ref 8.9–10.3)
Creatinine, Ser: 1.1 mg/dL (ref 0.61–1.24)
GFR calc non Af Amer: >60 mL/min (ref >60)
Glucose, Bld: 166 mg/dL — ABNORMAL HIGH (ref 65–99)
POTASSIUM: 4.8 mmol/L (ref 3.5–5.1)
Sodium: 141 mmol/L (ref 135–145)

## 2017-08-28 IMAGING — CT CT HIP*L* W/O CM
2 of 3 series · 17 of 46 positions shown, 19 images · non-contrast
Comparison: Radiographs 08/27/2017

CLINICAL DATA: Left total hip arthroplasty 08/26/2017. Fell
yesterday during physical therapy. Left hip pain.

EXAM:
CT OF THE LEFT HIP WITHOUT CONTRAST
TECHNIQUE: Multidetector CT imaging of the left hip was performed according to
the standard protocol. Multiplanar CT image reconstructions were
also generated.

[Series 3: axial (person_name) (person_name) · axial · 0.53mm/px · z∈[-320,-114]mm · 14 of 119 slices shown, 16 images]
[im 8/119  soft-tissue]
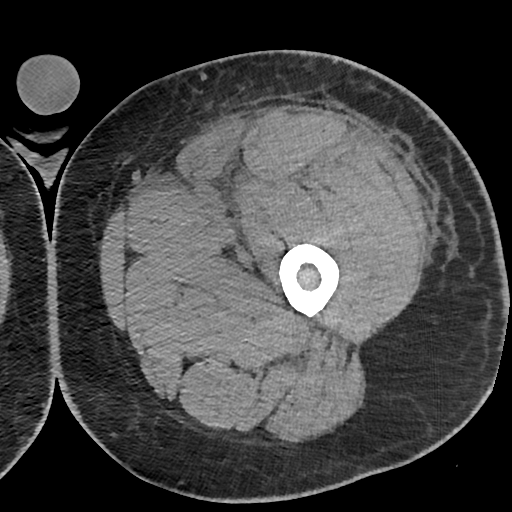
[im 8/119  bone]
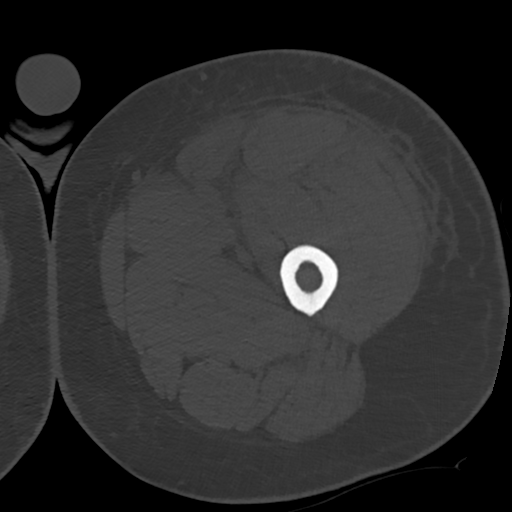
[im 16/119  soft-tissue]
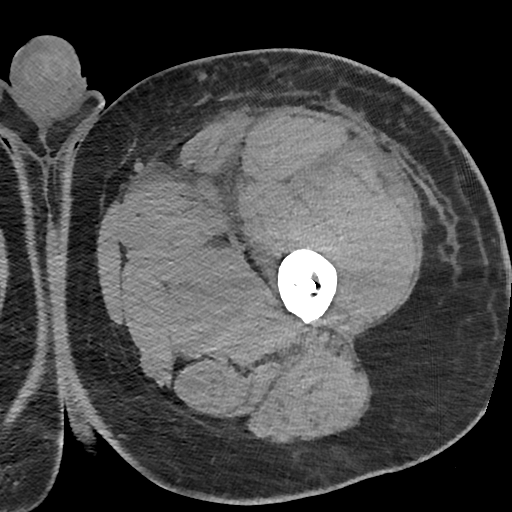
[im 23/119  soft-tissue]
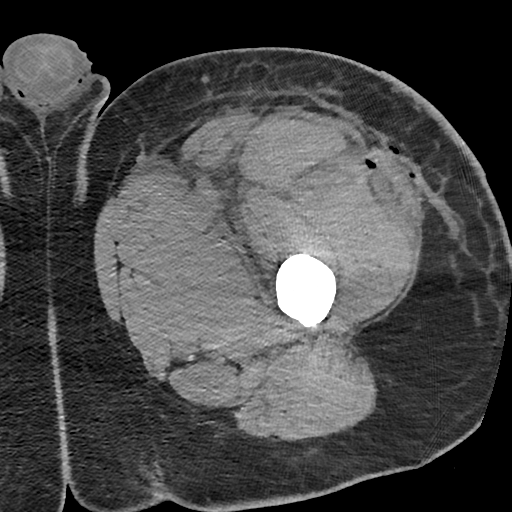
[im 31/119  soft-tissue]
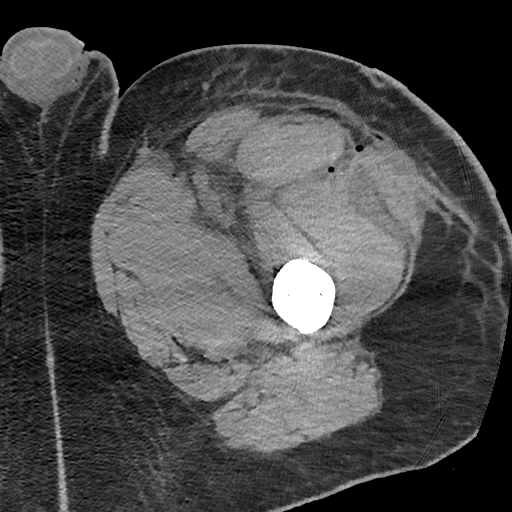
[im 39/119  soft-tissue]
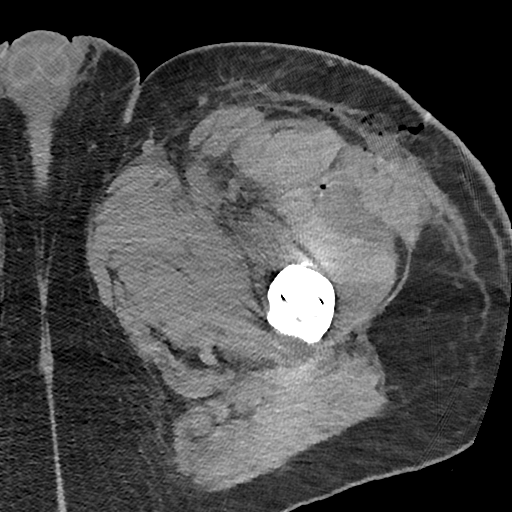
[im 46/119  soft-tissue]
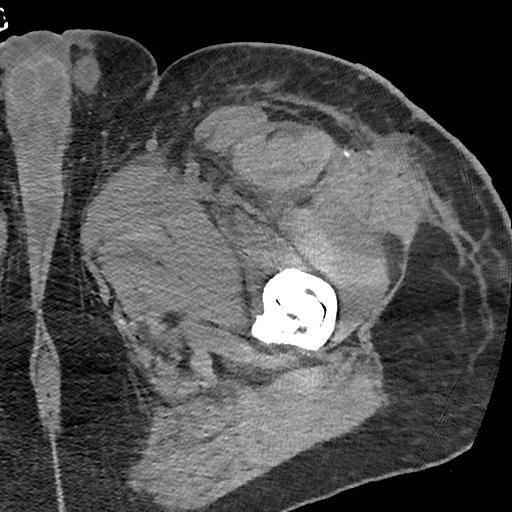
[im 54/119  soft-tissue]
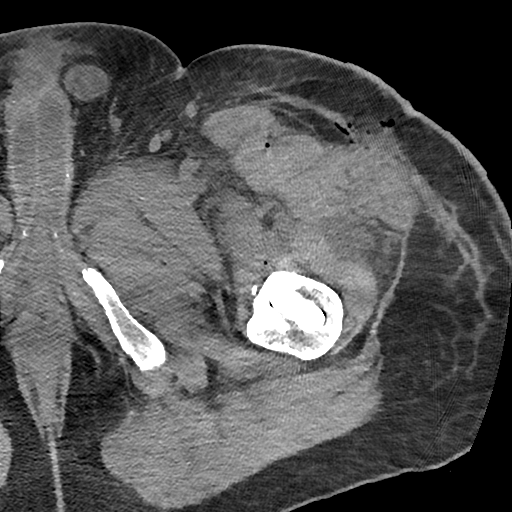
[im 65/119  soft-tissue]
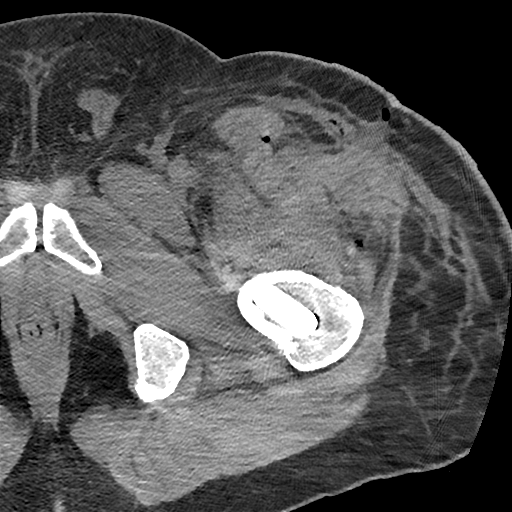
[im 73/119  soft-tissue]
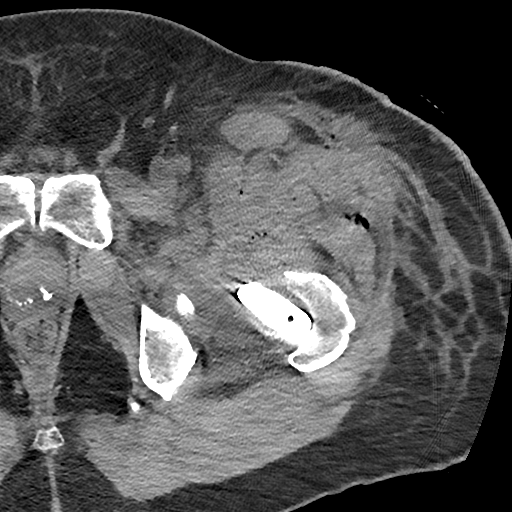
[im 73/119  bone]
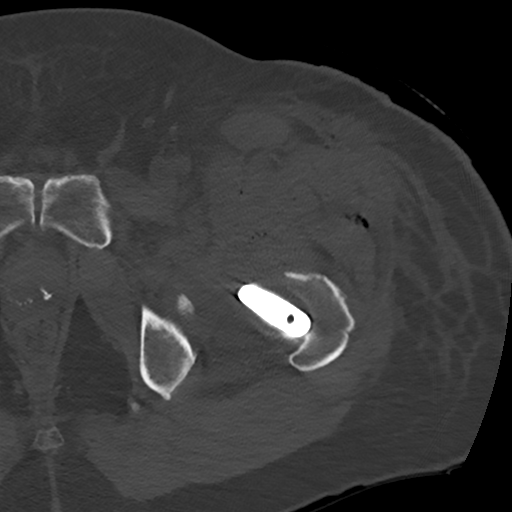
[im 80/119  soft-tissue]
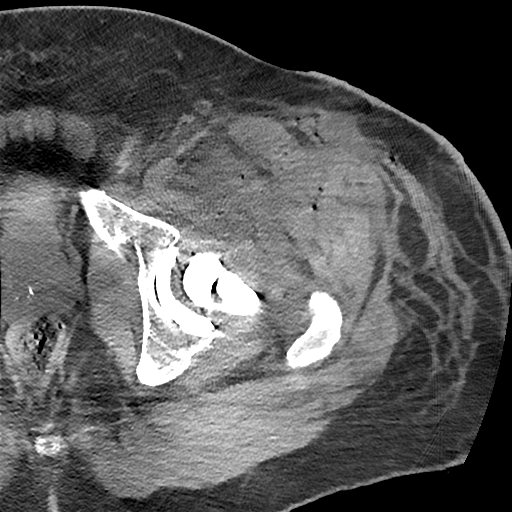
[im 88/119  soft-tissue]
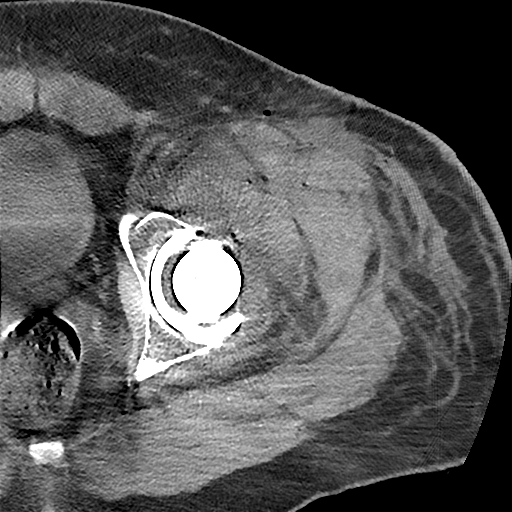
[im 96/119  soft-tissue]
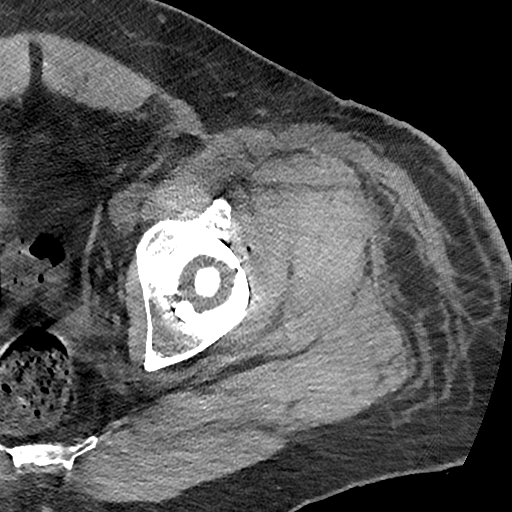
[im 103/119  soft-tissue]
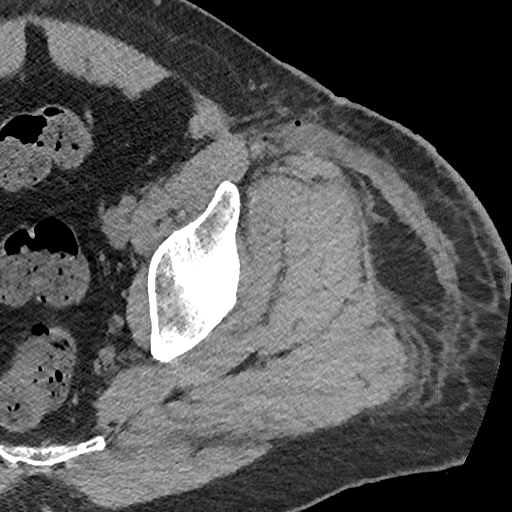
[im 111/119  soft-tissue]
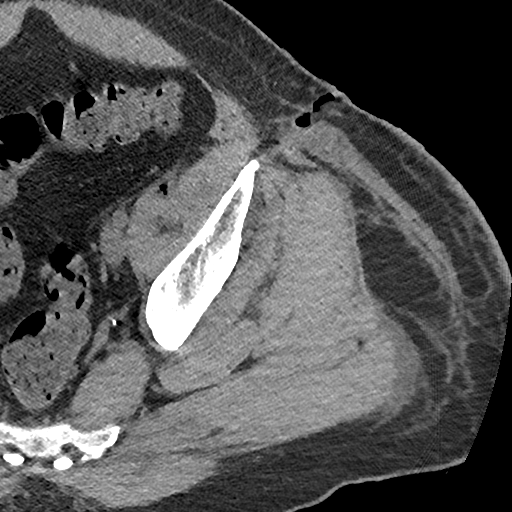

[Series 8: coronal st · coronal · 0.46mm/px · 3 of 95 slices shown]
[im 32/95  soft-tissue]
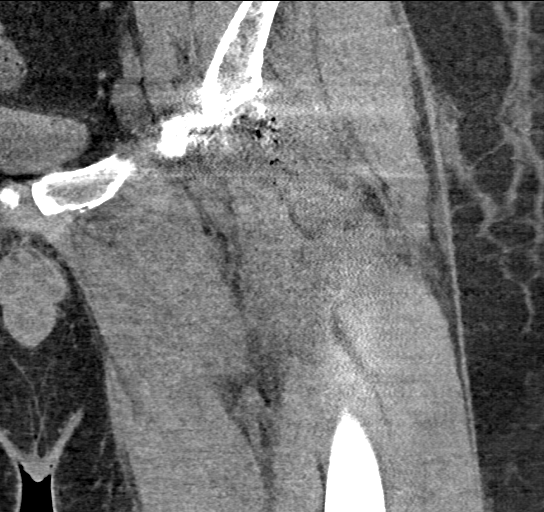
[im 42/95  soft-tissue]
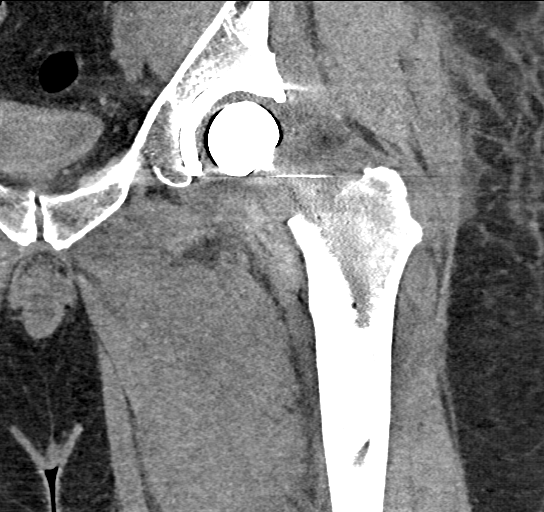
[im 53/95  soft-tissue]
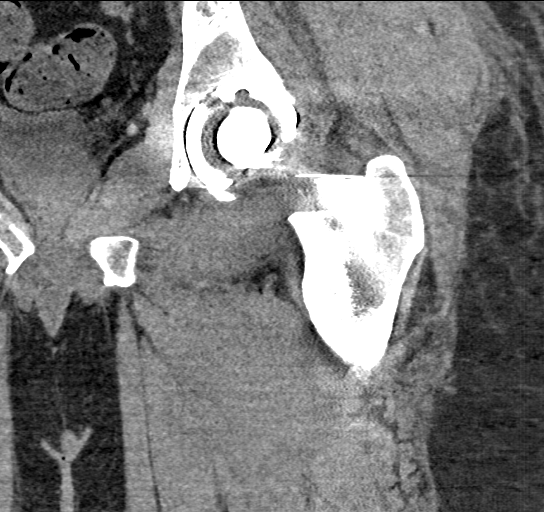

[17 of 46 positions shown; findings below may reference images not displayed]

FINDINGS: The femoral and acetabular components of the prosthesis are intact.

Subtle nondisplaced fractures involving the anterior cortex of the
anterior wall of the acetabulum along its junction with the superior
pubic ramus. No inferior pubic ramus fracture. The pubic symphysis
and visualized left SI joint are intact.

There is moderate hematoma involving the anterior thigh musculature.
This is likely postoperative. There is also air in the soft tissues
which is postoperative.
IMPRESSION: 1. Subtle nondisplaced fracture involving the superior pubic ramus
near its junction with the anterior acetabulum.
2. The acetabular and femoral components of the prosthesis are well
seated. No complicating features are identified.
3. Moderate-sized hematoma involving the anterior thigh musculature.

## 2017-08-28 NOTE — Progress Notes (Signed)
Spoke with Dr. Aundria Rud about patient's CT scan. Dr. Aundria Rud stated to continue PT with WBAT status.

## 2017-08-28 NOTE — Progress Notes (Signed)
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   Not seen this morning due to CT which shows  1. Subtle nondisplaced fracture involving the superior pubic ramus near its junction with the anterior acetabulum. 2. The acetabular and femoral components of the prosthesis are well seated. No complicating features are identified. 3. Moderate-sized hematoma involving the anterior thigh musculature. Resume PT WBAT per RN per MD/PA Assisted OOB to amb twice.  Used Bariatric walker vs regular for increased support.  Used + 2 assist for safety such that recliner was following.  Tolerated well but pt tends to easily distract self with talking and rwequired VC's to focus and stay on task.  Pt wants to perform more that he is currently able.  Returned to room and performed some THR TE's followed by ICE.      Follow Up Recommendations  Home health PT     Equipment Recommendations  None recommended by PT    Recommendations for Other Services       Precautions / Restrictions Precautions Precautions: Fall Restrictions Weight Bearing Restrictions: No    Mobility  Bed Mobility Overal bed mobility: Needs Assistance Bed Mobility: Supine to Sit     Supine to sit: Min assist     General bed mobility comments: assist with left leg and increased time to complete self scooting  Transfers Overall transfer level: Needs assistance Equipment used: Rolling walker (2 wheeled) Transfers: Sit to/from Stand Sit to Stand: +2 safety/equipment;Min assist;Mod assist         General transfer comment: elevated bed with increased time performed twice with 25% VC's on proper hand placement   Ambulation/Gait Ambulation/Gait assistance: Min assist;+2 safety/equipment Ambulation Distance (Feet):  50 Feet(25 feet x 2 one sitting rest break ) Assistive device: Rolling walker (2 wheeled)(Bariatric walker) Gait Pattern/deviations: Step-to pattern;Step-through pattern;Decreased step length - left;Decreased stance time - left Gait velocity: decreased   General Gait Details: used Bariatric for increased support.  Amb + 2 assist for safety due to fall yesterday.  Required 50% VC's to focus and stay on task with proper walker to self distance and upright posture.  Recliner following closely behind.  Pt amb twice.     Stairs             Wheelchair Mobility    Modified Rankin (Stroke Patients Only)       Balance                                            Cognition Arousal/Alertness: Awake/alert Behavior During Therapy: WFL for tasks assessed/performed Overall Cognitive Status: Within Functional Limits for tasks assessed                                 General Comments: requires redirection to focus on task   AKA hyper talkative      Exercises   Total Hip Replacement TE's 10 reps ankle pumps 10 reps knee presses 10 reps heel slides 10 reps SAQ's 10 reps ABD Followed by ICE     General Comments        Pertinent Vitals/Pain  Pain Assessment: 0-10 Pain Score: 6  Pain Location: left thigh and hip Pain Descriptors / Indicators: Aching;Discomfort;Grimacing;Guarding;Operative site guarding Pain Intervention(s): Monitored during session;Repositioned;Ice applied    Home Living                      Prior Function            PT Goals (current goals can now be found in the care plan section)      Frequency    7X/week      PT Plan Current plan remains appropriate    Co-evaluation              AM-PAC PT "6 Clicks" Daily Activity  Outcome Measure  Difficulty turning over in bed (including adjusting bedclothes, sheets and blankets)?: A Lot Difficulty moving from lying on back to sitting on the side of the bed?  : A Lot Difficulty sitting down on and standing up from a chair with arms (e.g., wheelchair, bedside commode, etc,.)?: A Lot Help needed moving to and from a bed to chair (including a wheelchair)?: A Lot Help needed walking in hospital room?: A Lot Help needed climbing 3-5 steps with a railing? : Total 6 Click Score: 11    End of Session Equipment Utilized During Treatment: Gait belt Activity Tolerance: Patient tolerated treatment well Patient left: in chair;with call bell/phone within reach Nurse Communication: Mobility status PT Visit Diagnosis: Unsteadiness on feet (R26.81);History of falling (Z91.81);Pain Pain - Right/Left: Left Pain - part of body: Hip     Time: 1415-1440 PT Time Calculation (min) (ACUTE ONLY): 25 min  Charges:  $Gait Training: 8-22 mins $Therapeutic Exercise: 8-22 mins                    G Codes:       Felecia Shelling  PTA [facility]  Acute  Rehab Pager      [phone]

## 2017-08-28 NOTE — Progress Notes (Signed)
OT Cancellation Note  Patient Details Name: Russell Steele MRN: [id number] DOB: Feb 18, 1956   Cancelled Treatment:    Reason Eval/Treat Not Completed: Other (comment)  Noted waiting on CT results. Will check on pt next day Lise Auer, [place] [phone]  Einar Crow D 08/28/2017, 1:26 PM

## 2017-08-28 NOTE — Progress Notes (Signed)
    Subjective:  Patient reports pain as mild to moderate.  Denies N/V/CP/SOB. Had assisted fall to ground on buttocks while working with PT. he continues to complain of inability to flex the left hip or weight-bear.  Increased pain.  Objective:   VITALS:   Vitals:   08/27/17 1303 08/27/17 1943 08/27/17 2121 08/28/17 0507  BP: (!) 154/74  (!) 147/83 (!) 148/87  Pulse: 83 87 76 66  Resp: Temp: 99.5 F (37.5 C)  98.7 F (37.1 C) 98.7 F (37.1 C)  TempSrc: Oral     SpO2: 93% 95% 97% 99%  Weight:      Height:        NAD Neurovascular intact Intact pulses distally Dorsiflexion/Plantar flexion intact Incision: dressing C/D/I Compartment soft  He is unable to perform a straight leg raise.  Lab Results  Component Value Date   WBC 10.8 (H) 08/28/2017   HGB 9.9 (L) 08/28/2017   HCT 30.4 (L) 08/28/2017   MCV 100.7 (H) 08/28/2017   PLT 186 08/28/2017   BMET    Component Value Date/Time   NA 141 08/28/2017 0517   K 4.8 08/28/2017 0517   CL 106 08/28/2017 0517   CO2 26 08/28/2017 0517   GLUCOSE 166 (H) 08/28/2017 0517   BUN 20 08/28/2017 0517   CREATININE 1.10 08/28/2017 0517   CALCIUM 8.5 (L) 08/28/2017 0517   GFRNONAA >60 08/28/2017 0517   GFRAA >60 08/28/2017 0517     Assessment/Plan: 2 Days Post-Op   Principal Problem:   Avascular necrosis of hip, left (HCC)   WBAT with walker DVT ppx: apixaban, SCDs, TEDS PO pain control PT/OT Dispo:  Left hip x-rays were negative.  We have a CT scan of the hip pending.  We will update the floor with a note when those are completed.   Yolonda Kida 08/28/2017, 10:17 AM

## 2017-08-28 NOTE — Progress Notes (Signed)
PHYSICAL THERAPY  Cancel am PT session awaiting CT results and clarification to resume PT as well as updated weight bearing status.  Notified RN to call MD/PA .  Felecia Shelling  PTA [facility]  Acute  Rehab Pager      [phone]

## 2017-08-29 LAB — CBC
HCT: 30.2 % — ABNORMAL LOW (ref 39.0–52.0)
HEMOGLOBIN: 9.8 g/dL — AB (ref 13.0–17.0)
MCH: 32.5 pg (ref 26.0–34.0)
MCHC: 32.5 g/dL (ref 30.0–36.0)
MCV: 100 fL (ref 78.0–100.0)
Platelets: 205 10*3/uL (ref 150–400)
RBC: 3.02 MIL/uL — ABNORMAL LOW (ref 4.22–5.81)
RDW: 14.4 % (ref 11.5–15.5)
WBC: 6.9 10*3/uL (ref 4.0–10.5)

## 2017-08-29 LAB — BASIC METABOLIC PANEL
Anion gap: 7 (ref 5–15)
BUN: 12 mg/dL (ref 6–20)
CHLORIDE: 107 mmol/L (ref 101–111)
CO2: 27 mmol/L (ref 22–32)
CREATININE: 1.08 mg/dL (ref 0.61–1.24)
Calcium: 8.1 mg/dL — ABNORMAL LOW (ref 8.9–10.3)
GFR calc Af Amer: >60 mL/min (ref >60)
GFR calc non Af Amer: >60 mL/min (ref >60)
GLUCOSE: 102 mg/dL — AB (ref 65–99)
POTASSIUM: 4.2 mmol/L (ref 3.5–5.1)
Sodium: 141 mmol/L (ref 135–145)

## 2017-08-29 NOTE — Progress Notes (Signed)
Physical Therapy Treatment Patient Details Name: Russell Steele MRN: [id number] DOB: [birth year] Today's Date: 08/29/2017    History of Present Illness  Russell Steele Left, H/O right Russell Steele with postop periprosthetic femur fracture.     PT Comments    Patient progressing with ambulation this session and able to perform stair training.  Feel he should be stable for d/c home with assist and HHPT at d/c.    Follow Up Recommendations  Home health PT     Equipment Recommendations  None recommended by PT    Recommendations for Other Services       Precautions / Restrictions Precautions Precautions: Fall Restrictions Weight Bearing Restrictions: No    Mobility  Bed Mobility Overal bed mobility: Needs Assistance Bed Mobility: Supine to Sit     Supine to sit: Min guard;HOB elevated     General bed mobility comments: up in chair  Transfers Overall transfer level: Needs assistance Equipment used: Rolling walker (2 wheeled) Transfers: Sit to/from Stand Sit to Stand: Min assist        Lateral/Scoot Transfers: Min guard General transfer comment: increased time esp stand to sit for L LE management  Ambulation/Gait Ambulation/Gait assistance: Min guard;+2 safety/equipment(chair follow) Ambulation Distance (Feet): 60 Feet(x 2) Assistive device: Rolling walker (2 wheeled) Gait Pattern/deviations: Step-through pattern;Decreased stride length;Antalgic     General Gait Details: pushed himself to increase distance this session   Stairs Stairs: Yes Stairs assistance: Min guard Stair Management: One rail Right;With crutches;Forwards;Step to pattern Number of Stairs: 4 General stair comments: used crutch and step to pattern after demonstration with minguard for safety/balance and cues for sequence   Wheelchair Mobility    Modified Rankin (Stroke Patients Only)       Balance Overall balance assessment: Needs assistance Sitting-balance support: Feet supported Sitting  balance-Leahy Scale: Good       Standing balance-Leahy Scale: Fair Standing balance comment: can stand without UE support                            Cognition Arousal/Alertness: Awake/alert Behavior During Therapy: WFL for tasks assessed/performed Overall Cognitive Status: Within Functional Limits for tasks assessed                                        Exercises Total Joint Exercises Ankle Circles/Pumps: AROM;Both;10 reps Heel Slides: AAROM;Left;10 reps(initially PT assist, then used sheet to help) Long Arc Quad: AROM;10 reps;Left;Seated    General Comments General comments (skin integrity, edema, etc.): discussed car transfers      Pertinent Vitals/Pain Pain Score: 5  Pain Location: L hip and thigh Pain Descriptors / Indicators: Aching Pain Intervention(s): Monitored during session;Repositioned;Ice applied    Home Living Family/patient expects to be discharged to:: Private residence Living Arrangements: Spouse/significant other Available Help at Discharge: Available 24 hours/day;Family Type of Home: House Home Access: Ramped entrance   Home Layout: One level Home Equipment: Grab bars - tub/shower;Walker - standard;Wheelchair - [employer];Tub bench;Adaptive equipment Additional Comments: daughter is planning to stay. Pt states he may consider obtaining a reacher and long handled sponge.     Prior Function        Comments: enjoys his dogs   PT Goals (current goals can now be found in the care plan section) Acute Rehab PT Goals Patient Stated Goal: get stronger. Progress towards PT goals: Progressing toward goals  Frequency    7X/week      PT Plan Current plan remains appropriate    Co-evaluation              AM-PAC PT "6 Clicks" Daily Activity  Outcome Measure  Difficulty turning over in bed (including adjusting bedclothes, sheets and blankets)?: A Lot Difficulty moving from lying on back to sitting on  the side of the bed? : A Lot Difficulty sitting down on and standing up from a chair with arms (e.g., wheelchair, bedside commode, etc,.)?: A Little Help needed moving to and from a bed to chair (including a wheelchair)?: A Little Help needed walking in hospital room?: A Little Help needed climbing 3-5 steps with a railing? : A Little 6 Click Score: 16    End of Session Equipment Utilized During Treatment: Gait belt Activity Tolerance: Patient tolerated treatment well Patient left: in chair   PT Visit Diagnosis: Unsteadiness on feet (R26.81);History of falling (Z91.81);Pain     Time: 8416-6063 PT Time Calculation (min) (ACUTE ONLY): 31 min  Charges:  $Gait Training: 8-22 mins $Therapeutic Exercise: 8-22 mins                    G CodesSheran Lawless, [place] [phone] 08/29/2017    Elray Mcgregor 08/29/2017, 3:03 PM

## 2017-08-29 NOTE — Evaluation (Addendum)
Occupational Therapy Evaluation Patient Details Name: Russell Steele MRN: [id number] DOB: 11-21-1955 Today's Date: 08/29/2017    History of Present Illness  Russell Steele, H/O right Russell with postop periprosthetic femur fracture.    Clinical Impression   Pt did well with in room functional mobility with the walker. Practiced safety with sit to stand and stand to sit transfers also for safe toilet use. Pt educated on AE option for LB dressing and demonstrated use. Will benefit from additional OT session to practice tub transfer, 3in1 transfer and AE use.   After assisted fall on 08/27/17, CT L hip showed: 1 Subtle nondisplaced fracture involving the superior pubic ramus near its junction with the anterior acetabulum. 2. The acetabular and femoral components of the prosthesis are well seated. No complicating features are identified. 3. Moderate-sized hematoma involving the anterior thigh musculature  Continue WBAT L LE per MD notes.     Follow Up Recommendations  No OT follow up;Supervision/Assistance - 24 hour    Equipment Recommendations  3 in 1 bedside commode(wide)    Recommendations for Other Services       Precautions / Restrictions Precautions Precautions: Fall Restrictions Weight Bearing Restrictions: No      Mobility Bed Mobility Overal bed mobility: Needs Assistance Bed Mobility: Supine to Sit     Supine to sit: Min guard;HOB elevated     General bed mobility comments: pt used gait belt to assist L LE over to EOB. Did also use the rail.   Transfers Overall transfer level: Needs assistance Equipment used: Rolling walker (2 wheeled) Transfers: Sit to/from Stand Sit to Stand: Min assist         General transfer comment: verbal cues for hand placement and LE management.     Balance                                           ADL either performed or assessed with clinical judgement   ADL Overall ADL's : Needs  assistance/impaired Eating/Feeding: Independent;Sitting   Grooming: Wash/dry hands;Set up;Sitting   Upper Body Bathing: Set up;Sitting   Lower Body Bathing: Moderate assistance;Sit to/from stand   Upper Body Dressing : Set up;Sitting   Lower Body Dressing: Moderate assistance;Sit to/from stand   Toilet Transfer: Minimal assistance;Ambulation;RW   Toileting- Clothing Manipulation and Hygiene: Minimal assistance;Sit to/from stand         General ADL Comments: Pt has a sock aid but states he isnt familiar with use. Demonstrated how to use sock aid. Pt has the shoe horn also and may consider obtaining a reacher and long handled sponge. Educated pt on where to obtain AE if desired. Pt also has a tub transfer bench and would like to practice this transfer before d/c. Educated on safety with transfers and walker. Explained how he should sit to thread on clothing and then stand to pull up clothing with walker in front of him.      Vision Patient Visual Report: No change from baseline       Perception     Praxis      Pertinent Vitals/Pain Pain Assessment: 0-10 Pain Score: 6  Pain Location: L hip and thigh Pain Descriptors / Indicators: Aching Pain Intervention(s): Monitored during session;Ice applied     Hand Dominance     Extremity/Trunk Assessment Upper Extremity Assessment Upper Extremity Assessment: Overall WFL for tasks assessed  Communication Communication Communication: No difficulties   Cognition Arousal/Alertness: Awake/alert Behavior During Therapy: WFL for tasks assessed/performed Overall Cognitive Status: Within Functional Limits for tasks assessed                                     General Comments       Exercises     Shoulder Instructions      Home Living Family/patient expects to be discharged to:: Private residence Living Arrangements: Spouse/significant other Available Help at Discharge: Available 24  hours/day;Family Type of Home: House Home Access: Ramped entrance     Home Layout: One level     Bathroom Shower/Tub: Tub/shower unit;Curtain   Bathroom Toilet: Handicapped height     Home Equipment: Grab bars - tub/shower;Walker - standard;Wheelchair - [employer];Tub bench;Adaptive equipment Adaptive Equipment: Sock aid;Long-handled shoe horn Additional Comments: daughter is planning to stay. Pt states he may consider obtaining a reacher and long handled sponge.       Prior Functioning/Environment          Comments: enjoys his dogs        OT Problem List: Decreased strength;Decreased knowledge of use of DME or AE      OT Treatment/Interventions: Self-care/ADL training;DME and/or AE instruction;Therapeutic activities;Patient/family education    OT Goals(Current goals can be found in the care plan section) Acute Rehab OT Goals Patient Stated Goal: get stronger. OT Goal Formulation: With patient Time For Goal Achievement: 09/05/17 Potential to Achieve Goals: Good  OT Frequency: Min 2X/week   Barriers to D/C:            Co-evaluation              AM-PAC PT "6 Clicks" Daily Activity     Outcome Measure Help from another person eating meals?: None Help from another person taking care of personal grooming?: A Little Help from another person toileting, which includes using toliet, bedpan, or urinal?: A Little Help from another person bathing (including washing, rinsing, drying)?: A Lot Help from another person to put on and taking off regular upper body clothing?: None Help from another person to put on and taking off regular lower body clothing?: A Lot 6 Click Score: 18   End of Session Equipment Utilized During Treatment: Gait belt  Activity Tolerance: Patient tolerated treatment well Patient Steele: in chair;with call bell/phone within reach  OT Visit Diagnosis: Unsteadiness on feet (R26.81);Muscle weakness (generalized) (M62.81)                 Time: 9604-5409 OT Time Calculation (min): 33 min Charges:  OT General Charges $OT Visit: 1 Visit OT Evaluation $OT Eval Low Complexity: 1 Low OT Treatments $Therapeutic Activity: 8-22 mins G-Codes:       [name] [name] 08/29/2017, 11:32 AM

## 2017-08-29 NOTE — Progress Notes (Signed)
    Subjective: 3 Days Post-Op Procedure(s) (LRB): LEFT TOTAL HIP ARTHROPLASTY ANTERIOR APPROACH (Left) Patient reports pain as 6 on 0-10 scale.   Denies CP or SOB.  Voiding without difficulty. Positive flatus.  Pt felt like needs to have a BM. Eating well.  Objective: Vital signs in last 24 hours: Temp:  [98.6 F (37 C)-99 F (37.2 C)] 98.6 F (37 C) (04/28 0521) Pulse Rate:  [62-81] 62 (04/28 0521) Resp:  [16-20] 16 (04/28 0521) BP: (137-139)/(72-76) 139/76 (04/28 0521) SpO2:  [97 %-100 %] 99 % (04/28 0521)  Intake/Output from previous day: 04/27 0701 - 04/28 0700 In: 1600 [P.O.:1000; I.V.:600] Out: 1550 [Urine:1550] Intake/Output this shift: No intake/output data recorded.  Labs: Recent Labs    08/27/17 0532 08/28/17 0517 08/29/17 0537  HGB 11.0* 9.9* 9.8*   Recent Labs    08/28/17 0517 08/29/17 0537  WBC 10.8* 6.9  RBC 3.02* 3.02*  HCT 30.4* 30.2*  PLT 186 205   Recent Labs    08/28/17 0517 08/29/17 0537  NA 141 141  K 4.8 4.2  CL 106 107  CO2 26 27  BUN 20 12  CREATININE 1.10 1.08  GLUCOSE 166* 102*  CALCIUM 8.5* 8.1*   No results for input(s): LABPT, INR in the last 72 hours.  Physical Exam: Neurologically intact ABD soft Sensation intact distally Dorsiflexion/Plantar flexion intact Incision: no drainage No cellulitis present Compartment soft Body mass index is 34.12 kg/m.   Assessment/Plan: 3 Days Post-Op Procedure(s) (LRB): LEFT TOTAL HIP ARTHROPLASTY ANTERIOR APPROACH (Left) Advance diet Up with therapy  Pt had spoken to DR about results of CT  Shy Guallpa, Baxter Kail for Dr. Venita Lick [facility] [phone] 08/29/2017, 7:57 AM

## 2017-08-29 NOTE — Care Management Note (Addendum)
Case Management Note  Patient Details  Name: Russell Steele MRN: [id number] Date of Birth: February 19, 1956  Subjective/Objective:   Left THA                 Action/Plan: NCM spoke to pt and he is requesting HHPT. Pt agreeable to [facility]. Contacted [facility] with new referral. Pt is serviced from [place] [place]. They would have to verify coverage for HHPT. Pt states he has RW. Requesting 3n1 bedside commode. Faxed paperwork to [facility]. Explained they will ship 3n1 to his home. Will need HHPT order. Message left for attending.   Received call back from [facility], they will initiate auth from [place] for HHPT. Will have NCM follow up at dc.   Expected Discharge Date:  08/27/17               Expected Discharge Plan:  Home w Home Health Services  In-House Referral:  NA  Discharge planning Services  CM Consult  Post Acute Care Choice:  Home Health Choice offered to:  Patient  DME Arranged:  3-N-1 DME Agency:  [facility] [place]  HH Arranged:  PT [facility] Agency:     Status of Service:  In process, will continue to follow  If discussed at Long Length of Stay Meetings, dates discussed:    Additional Comments:  Elliot Cousin, RN 08/29/2017, 3:54 PM

## 2017-08-30 LAB — BASIC METABOLIC PANEL
Anion gap: 5 (ref 5–15)
BUN: 13 mg/dL (ref 6–20)
CHLORIDE: 102 mmol/L (ref 101–111)
CO2: 31 mmol/L (ref 22–32)
Calcium: 8.2 mg/dL — ABNORMAL LOW (ref 8.9–10.3)
Creatinine, Ser: 1.08 mg/dL (ref 0.61–1.24)
GFR calc Af Amer: >60 mL/min (ref >60)
GFR calc non Af Amer: >60 mL/min (ref >60)
GLUCOSE: 104 mg/dL — AB (ref 65–99)
POTASSIUM: 4 mmol/L (ref 3.5–5.1)
Sodium: 138 mmol/L (ref 135–145)

## 2017-08-30 NOTE — Progress Notes (Signed)
Occupational Therapy Treatment Patient Details Name: Candelario Steppe MRN: [id number] DOB: 1956/03/10 Today's Date: 08/30/2017    History of present illness  DATHA Left, H/O right DATHA with postop periprosthetic femur fracture.    OT comments  Pt has tub bench and all AE. Noted 3n 1 ordered  Follow Up Recommendations  No OT follow up;Supervision/Assistance - 24 hour    Equipment Recommendations  3 in 1 bedside commode(wide)    Recommendations for Other Services      Precautions / Restrictions Precautions Precautions: Fall Restrictions Weight Bearing Restrictions: No       Mobility Bed Mobility Overal bed mobility: Needs Assistance Bed Mobility: Supine to Sit           General bed mobility comments: pt used trapeze/  States he will have a lift chair at home  Transfers Overall transfer level: Needs assistance Equipment used: Rolling walker (2 wheeled)                      ADL either performed or assessed with clinical judgement   ADL Overall ADL's : Needs assistance/impaired                         Toilet Transfer: Minimal assistance;Ambulation;RW;Min guard   Toileting- Architect and Hygiene: Min guard;Sit to/from stand;Cueing for safety;Cueing for sequencing Toileting - Clothing Manipulation Details (indicate cue type and reason): pt has AE at home and states wife/daugther will A him Tub/ Shower Transfer: Tub Building surveyor Details (indicate cue type and reason): verbalized safety.  Pt has a tub bench at home.  Will practice with HH   General ADL Comments: Education complete regarding AE. Pt also states wife will A as needed.  Pt has all needed AE as well as a tub bench               Cognition Arousal/Alertness: Awake/alert Behavior During Therapy: WFL for tasks assessed/performed Overall Cognitive Status: Within Functional Limits for tasks assessed                                                      Pertinent Vitals/ Pain       Pain Score: 4  Pain Location: L hip and thigh Pain Descriptors / Indicators: Discomfort;Sore Pain Intervention(s): Monitored during session;Repositioned     Prior Functioning/Environment              Frequency  Min 2X/week        Progress Toward Goals  OT Goals(current goals can now be found in the care plan section)  Progress towards OT goals: Progressing toward goals     Plan      Co-evaluation                 AM-PAC PT "6 Clicks" Daily Activity     Outcome Measure   Help from another person eating meals?: None Help from another person taking care of personal grooming?: A Little Help from another person toileting, which includes using toliet, bedpan, or urinal?: A Little Help from another person bathing (including washing, rinsing, drying)?: A Little Help from another person to put on and taking off regular upper body clothing?: None Help from another person to put on and taking off regular lower body clothing?: A Little 6 Click  Score: 20    End of Session Equipment Utilized During Treatment: Gait belt  OT Visit Diagnosis: Unsteadiness on feet (R26.81);Muscle weakness (generalized) (M62.81)   Activity Tolerance Patient tolerated treatment well   Patient Left in chair;with call bell/phone within reach   Nurse Communication          Time: 1610-9604 OT Time Calculation (min): 10 min  Charges: OT General Charges $OT Visit: 1 Visit OT Treatments $Self Care/Home Management : 8-22 mins  [place], [place] [phone]   Einar Crow D 08/30/2017, 10:36 AM

## 2017-08-30 NOTE — Progress Notes (Signed)
    Subjective:  Patient reports pain as mild to moderate.  Denies N/V/CP/SOB. Making progress with PT.  Objective:   VITALS:   Vitals:   08/29/17 1345 08/29/17 2018 08/29/17 2159 08/30/17 0642  BP: 131/76  126/70 (!) 156/69  Pulse: 88 86 72 74  Resp: Temp: 99.1 F (37.3 C)  99.2 F (37.3 C) 98.8 F (37.1 C)  TempSrc: Oral  Oral Oral  SpO2: 98% 97% 98% 98%  Weight:      Height:        NAD Neurovascular intact Intact pulses distally Dorsiflexion/Plantar flexion intact Incision: dressing C/D/I Compartment soft   Lab Results  Component Value Date   WBC 6.9 08/29/2017   HGB 9.8 (L) 08/29/2017   HCT 30.2 (L) 08/29/2017   MCV 100.0 08/29/2017   PLT 205 08/29/2017   BMET    Component Value Date/Time   NA 138 08/30/2017 0524   K 4.0 08/30/2017 0524   CL 102 08/30/2017 0524   CO2 31 08/30/2017 0524   GLUCOSE 104 (H) 08/30/2017 0524   BUN 13 08/30/2017 0524   CREATININE 1.08 08/30/2017 0524   CALCIUM 8.2 (L) 08/30/2017 0524   GFRNONAA >60 08/30/2017 0524   GFRAA >60 08/30/2017 0524     Assessment/Plan: 4 Days Post-Op   Principal Problem:   Avascular necrosis of hip, left (HCC)   WBAT with walker DVT ppx: apixaban, SCDs, TEDS PO pain control PT/OT Dispo: D/C home with HHPT, stay on walker for 6 weeks   Iline Oven Dequavious Harshberger 08/30/2017, 7:36 AM   Samson Frederic, MD Cell [phone]

## 2017-08-30 NOTE — Discharge Summary (Signed)
Physician Discharge Summary  Patient ID: Russell Steele MRN: [id number] DOB/AGE: Sep 07, 1955 62 y.o.  Admit date: 08/26/2017 Discharge date: 08/31/2017  Admission Diagnoses:  Avascular necrosis of hip, left [facility])  Discharge Diagnoses:  Principal Problem:   Avascular necrosis of hip, left [facility])   Past Medical History:  Diagnosis Date  . Anxiety   . Arthritis   . Depression   . History of kidney stones   . Sleep apnea     Surgeries: Procedure(s): LEFT TOTAL HIP ARTHROPLASTY ANTERIOR APPROACH on 08/26/2017   Consultants (if any):   Discharged Condition: Improved  Hospital Course: Russell Steele is an 62 y.o. male who was admitted 08/26/2017 with a diagnosis of Avascular necrosis of hip, left (HCC) and went to the operating room on 08/26/2017 and underwent the above named procedures.    He was given perioperative antibiotics:  Anti-infectives (From admission, onward)   Start     Dose/Rate Route Frequency Ordered Stop   08/26/17 1600  ceFAZolin (ANCEF) IVPB 2g/100 mL premix     2 g 200 mL/hr over 30 Minutes Intravenous Every 6 hours 08/26/17 1411 08/26/17 2121   08/26/17 0600  ceFAZolin (ANCEF) 3 g in dextrose 5 % 50 mL IVPB     3 g 130 mL/hr over 30 Minutes Intravenous On call to O.R. 08/25/17 1311 08/27/17 1032    .  He was given sequential compression devices, early ambulation, and apixaban for DVT prophylaxis.  On POD#1, he had an assisted fall with PT. He developed increased pain, so a CT was obtained showing stable implants with nondisplaced high ramus fracture.  He benefited maximally from the hospital stay.  Recent vital signs:  Vitals:   08/29/17 2159 08/30/17 0642  BP: 126/70 (!) 156/69  Pulse: 72 74  Resp: 16 16  Temp: 99.2 F (37.3 C) 98.8 F (37.1 C)  SpO2: 98% 98%    Recent laboratory studies:  Lab Results  Component Value Date   HGB 9.8 (L) 08/29/2017   HGB 9.9 (L) 08/28/2017   HGB 11.0 (L) 08/27/2017   Lab Results  Component Value Date   WBC 6.9  08/29/2017   PLT 205 08/29/2017   Lab Results  Component Value Date   INR 1.01 09/26/2016   Lab Results  Component Value Date   NA 138 08/30/2017   K 4.0 08/30/2017   CL 102 08/30/2017   CO2 31 08/30/2017   BUN 13 08/30/2017   CREATININE 1.08 08/30/2017   GLUCOSE 104 (H) 08/30/2017    Discharge Medications:   Allergies as of 08/30/2017      Reactions   Bee Venom Anaphylaxis      Medication List    STOP taking these medications   acetaminophen 500 MG tablet Commonly known as:  TYLENOL   aspirin 81 MG chewable tablet   methocarbamol 750 MG tablet Commonly known as:  ROBAXIN     TAKE these medications   albuterol 108 (90 Base) MCG/ACT inhaler Commonly known as:  PROVENTIL HFA;VENTOLIN HFA Inhale 2 puffs into the lungs every 6 (six) hours as needed for wheezing or shortness of breath.   apixaban 2.5 MG Tabs tablet Commonly known as:  ELIQUIS Take 1 tablet (2.5 mg total) by mouth every 12 (twelve) hours. What changed:  when to take this   busPIRone 15 MG tablet Commonly known as:  BUSPAR Take 1 tablet (15 mg total) by mouth 4 (four) times daily.   diclofenac sodium 1 % Gel Commonly known as:  VOLTAREN Apply  1 application topically daily.   diphenhydrAMINE 25 mg capsule Commonly known as:  BENADRYL Take 50 mg by mouth at bedtime.   docusate sodium 100 MG capsule Commonly known as:  COLACE Take 1 capsule (100 mg total) by mouth 2 (two) times daily.   FLUoxetine 20 MG capsule Commonly known as:  PROZAC Take 1 capsule (20 mg total) by mouth daily. What changed:  when to take this   gabapentin 300 MG capsule Commonly known as:  NEURONTIN Take 2 capsules (600 mg total) by mouth 3 (three) times daily.   omeprazole 20 MG capsule Commonly known as:  PRILOSEC Take 20 mg by mouth daily.   ondansetron 4 MG tablet Commonly known as:  ZOFRAN Take 1 tablet (4 mg total) by mouth every 6 (six) hours as needed for nausea.   Oxycodone HCl 10 MG Tabs Take 1-1.5  tablets (10-15 mg total) by mouth every 4 (four) hours as needed for severe pain. What changed:    medication strength  how much to take  Another medication with the same name was removed. Continue taking this medication, and follow the directions you see here.   oxymetazoline 0.05 % nasal spray Commonly known as:  AFRIN Place 1 spray into both nostrils 2 (two) times daily as needed for congestion.   simvastatin 20 MG tablet Commonly known as:  ZOCOR Take 1 tablet (20 mg total) by mouth at bedtime.   tamsulosin 0.4 MG Caps capsule Commonly known as:  FLOMAX Take 1 capsule (0.4 mg total) by mouth at bedtime.       Diagnostic Studies: Dg Pelvis Portable  Result Date: 08/26/2017 CLINICAL DATA:  Status post left hip replacement today. EXAM: PORTABLE PELVIS 1-2 VIEWS COMPARISON:  Intraoperative imaging this same day. FINDINGS: New left total hip arthroplasty is in place. The device is located. No fracture. Gas in the soft tissues from surgery noted. Right hip replacement also seen. IMPRESSION: Status post left hip replacement.  No acute finding. Electronically Signed   By: Drusilla Kanner M.D.   On: 08/26/2017 13:23   Ct Hip Left Wo Contrast  Result Date: 08/28/2017 CLINICAL DATA:  Left total hip arthroplasty 08/26/2017. Fell yesterday during physical therapy. Left hip pain. EXAM: CT OF THE LEFT HIP WITHOUT CONTRAST TECHNIQUE: Multidetector CT imaging of the left hip was performed according to the standard protocol. Multiplanar CT image reconstructions were also generated. COMPARISON:  Radiographs 08/27/2017 FINDINGS: The femoral and acetabular components of the prosthesis are intact. Subtle nondisplaced fractures involving the anterior cortex of the anterior wall of the acetabulum along its junction with the superior pubic ramus. No inferior pubic ramus fracture. The pubic symphysis and visualized left SI joint are intact. There is moderate hematoma involving the anterior thigh  musculature. This is likely postoperative. There is also air in the soft tissues which is postoperative. IMPRESSION: 1. Subtle nondisplaced fracture involving the superior pubic ramus near its junction with the anterior acetabulum. 2. The acetabular and femoral components of the prosthesis are well seated. No complicating features are identified. 3. Moderate-sized hematoma involving the anterior thigh musculature. Electronically Signed   By: Rudie Meyer M.D.   On: 08/28/2017 11:15   Dg C-arm 1-60 Min-no Report  Result Date: 08/26/2017 Fluoroscopy was utilized by the requesting physician.  No radiographic interpretation.   Dg Hip Port Unilat With Pelvis 1v Left  Result Date: 08/27/2017 CLINICAL DATA:  Larey Seat doing physical therapy, had left hip surgery yesterday EXAM: DG HIP (WITH OR WITHOUT PELVIS) 1V  PORT LEFT COMPARISON:  Pelvis film of 09/27/2016 FINDINGS: The components of the left total hip replacement appear to be in good position. No fracture is seen. No malalignment is noted. There is some air in the soft tissues overlying the left hip from recent surgical intervention. The right total hip components also appear to be in normal position. The pelvic rami are intact. No acute abnormality is seen. IMPRESSION: Bilateral total hip prostheses are present in good position with no complicating features. Electronically Signed   By: Dwyane Dee M.D.   On: 08/27/2017 11:05   Dg Hip Operative Unilat With Pelvis Left  Result Date: 08/26/2017 CLINICAL DATA:  Left hip replacement. EXAM: OPERATIVE LEFT HIP (WITH PELVIS IF PERFORMED) TECHNIQUE: Fluoroscopic spot image(s) were submitted for interpretation post-operatively. COMPARISON:  Pelvis x-rays dated Sep 27, 2016. FINDINGS: Sequential intraoperative x-rays demonstrating interval left total hip arthroplasty. Partially visualized prior right total hip arthroplasty. No acute abnormality. IMPRESSION: 1. Interval left hip arthroplasty. FLUOROSCOPY TIME:  22.6  seconds. C-arm fluoroscopic images were obtained intraoperatively and submitted for post operative interpretation. Electronically Signed   By: Obie Dredge M.D.   On: 08/26/2017 12:50    Disposition:    Discharge Instructions    Call MD / Call 911   Complete by:  As directed    If you experience chest pain or shortness of breath, CALL 911 and be transported to the hospital emergency room.  If you develope a fever above 101 F, pus (white drainage) or increased drainage or redness at the wound, or calf pain, call your surgeon's office.   Constipation Prevention   Complete by:  As directed    Drink plenty of fluids.  Prune juice may be helpful.  You may use a stool softener, such as Colace (over the counter) 100 mg twice a day.  Use MiraLax (over the counter) for constipation as needed.   Diet - low sodium heart healthy   Complete by:  As directed    Discharge instructions   Complete by:  As directed    Weight bearing as tolerated with a walker Use walker for 6 weeks   Driving restrictions   Complete by:  As directed    No driving for 6 weeks   Increase activity slowly as tolerated   Complete by:  As directed    Lifting restrictions   Complete by:  As directed    No lifting for 6 weeks   TED hose   Complete by:  As directed    Use stockings (TED hose) for 2 weeks on both leg(s).  You may remove them at night for sleeping.      Follow-up Information    Alany Borman, Arlys John, MD. Schedule an appointment as soon as possible for a visit in 2 weeks.   Specialty:  Orthopedic Surgery Why:  For wound re-check Contact information: [address] [place] [address] [phone]            Signed: Iline Oven Lashaunda Schild 08/31/2017, 11:43 AM

## 2017-08-30 NOTE — Progress Notes (Signed)
Physical Therapy Treatment Patient Details Name: Russell Steele MRN: [id number] DOB: July 08, 1955 Today's Date: 08/30/2017    History of Present Illness  DATHA Left, H/O right DATHA with postop periprosthetic femur fracture.     PT Comments    POD # 4  Pt moving better and plans to D/C to home today.  Practiced stairs again.  Pt performed well.  Reviewed HEP.  Pt aware MD wants him to use walker for 6 weeks.    Follow Up Recommendations  Home health PT     Equipment Recommendations  None recommended by PT    Recommendations for Other Services       Precautions / Restrictions Precautions Precautions: Fall Restrictions Weight Bearing Restrictions: No    Mobility  Bed Mobility Overal bed mobility: Needs Assistance Bed Mobility: Supine to Sit           General bed mobility comments: OOB in recliner   Transfers Overall transfer level: Needs assistance Equipment used: Rolling walker (2 wheeled) Transfers: Sit to/from [employer] Transfers Sit to Stand: Supervision;Min guard Stand pivot transfers: Supervision;Min guard       General transfer comment: good use of hands to power up and and control desend  Ambulation/Gait Ambulation/Gait assistance: Supervision;Min guard Ambulation Distance (Feet): 85 Feet Assistive device: Rolling walker (2 wheeled) Gait Pattern/deviations: Step-through pattern;Decreased stride length;Antalgic Gait velocity: decreased   General Gait Details: tolerated an increased distance with increased ability to stay within walker and increased self awareness limits   Stairs Stairs: Yes Stairs assistance: Min guard;Supervision Stair Management: One rail Right;With crutches;Forwards;Step to pattern Number of Stairs: 4 General stair comments: used crutch and step to pattern after demonstration with minguard for safety/balance and cues for sequence   tolerated well   Wheelchair Mobility    Modified Rankin (Stroke Patients Only)       Balance                                            Cognition Arousal/Alertness: Awake/alert Behavior During Therapy: WFL for tasks assessed/performed Overall Cognitive Status: Within Functional Limits for tasks assessed                                        Exercises      General Comments        Pertinent Vitals/Pain Pain Assessment: 0-10 Pain Score: 5  Pain Location: L hip and thigh Pain Descriptors / Indicators: Discomfort;Sore;Operative site guarding Pain Intervention(s): Monitored during session;Repositioned;Ice applied;Premedicated before session    Home Living                      Prior Function            PT Goals (current goals can now be found in the care plan section) Progress towards PT goals: Progressing toward goals    Frequency    7X/week      PT Plan Current plan remains appropriate    Co-evaluation              AM-PAC PT "6 Clicks" Daily Activity  Outcome Measure  Difficulty turning over in bed (including adjusting bedclothes, sheets and blankets)?: A Lot Difficulty moving from lying on back to sitting on the side of the bed? : A Lot Difficulty  sitting down on and standing up from a chair with arms (e.g., wheelchair, bedside commode, etc,.)?: A Little Help needed moving to and from a bed to chair (including a wheelchair)?: A Little Help needed walking in hospital room?: A Little Help needed climbing 3-5 steps with a railing? : A Little 6 Click Score: 16    End of Session Equipment Utilized During Treatment: Gait belt Activity Tolerance: Patient tolerated treatment well Patient left: in chair Nurse Communication: Mobility status PT Visit Diagnosis: Unsteadiness on feet (R26.81);History of falling (Z91.81);Pain Pain - Right/Left: Left Pain - part of body: Hip     Time: 1610-9604 PT Time Calculation (min) (ACUTE ONLY): 32 min  Charges:  $Gait Training: 8-22 mins $Therapeutic  Activity: 8-22 mins                    G Codes:       Felecia Shelling  PTA [facility]  Acute  Rehab Pager      [phone]

## 2017-08-30 NOTE — Anesthesia Postprocedure Evaluation (Signed)
Anesthesia Post Note  Patient: Russell Steele  Procedure(s) Performed: LEFT TOTAL HIP ARTHROPLASTY ANTERIOR APPROACH (Left Hip)     Patient location during evaluation: PACU Anesthesia Type: General Level of consciousness: awake and alert Pain management: pain level controlled Vital Signs Assessment: post-procedure vital signs reviewed and stable Respiratory status: spontaneous breathing, nonlabored ventilation, respiratory function stable and patient connected to nasal cannula oxygen Cardiovascular status: blood pressure returned to baseline and stable Postop Assessment: no apparent nausea or vomiting Anesthetic complications: no    Last Vitals:  Vitals:   08/29/17 2159 08/30/17 0642  BP: 126/70 (!) 156/69  Pulse: 72 74  Resp: 16 16  Temp: 37.3 C 37.1 C  SpO2: 98% 98%    Last Pain:  Vitals:   08/30/17 0642  TempSrc: Oral  PainSc:                  Mayo Faulk S

## 2020-07-02 DEATH — deceased
